# Patient Record
Sex: Male | Born: 1955
Health system: Southern US, Community
[De-identification: ages and names within clinical notes are randomized; demographics above are authoritative.]

## PROBLEM LIST (undated history)

## (undated) DIAGNOSIS — R112 Nausea with vomiting, unspecified: Secondary | ICD-10-CM

## (undated) DIAGNOSIS — F419 Anxiety disorder, unspecified: Secondary | ICD-10-CM

## (undated) DIAGNOSIS — R42 Dizziness and giddiness: Secondary | ICD-10-CM

## (undated) DIAGNOSIS — I10 Essential (primary) hypertension: Secondary | ICD-10-CM

## (undated) DIAGNOSIS — K219 Gastro-esophageal reflux disease without esophagitis: Secondary | ICD-10-CM

## (undated) DIAGNOSIS — G5601 Carpal tunnel syndrome, right upper limb: Secondary | ICD-10-CM

## (undated) DIAGNOSIS — M1990 Unspecified osteoarthritis, unspecified site: Secondary | ICD-10-CM

## (undated) DIAGNOSIS — E785 Hyperlipidemia, unspecified: Secondary | ICD-10-CM

## (undated) DIAGNOSIS — J189 Pneumonia, unspecified organism: Secondary | ICD-10-CM

## (undated) DIAGNOSIS — T8859XA Other complications of anesthesia, initial encounter: Secondary | ICD-10-CM

## (undated) DIAGNOSIS — D649 Anemia, unspecified: Secondary | ICD-10-CM

## (undated) DIAGNOSIS — R918 Other nonspecific abnormal finding of lung field: Secondary | ICD-10-CM

## (undated) DIAGNOSIS — M545 Low back pain, unspecified: Secondary | ICD-10-CM

## (undated) DIAGNOSIS — Z9889 Other specified postprocedural states: Secondary | ICD-10-CM

## (undated) DIAGNOSIS — T884XXA Failed or difficult intubation, initial encounter: Secondary | ICD-10-CM

## (undated) HISTORY — DX: Pneumonia, unspecified organism: J18.9

## (undated) HISTORY — DX: Gastro-esophageal reflux disease without esophagitis: K21.9

## (undated) HISTORY — PX: APPENDECTOMY: SHX54

## (undated) HISTORY — DX: Low back pain, unspecified: M54.50

## (undated) HISTORY — DX: Anxiety disorder, unspecified: F41.9

## (undated) HISTORY — PX: MANDIBLE FRACTURE SURGERY: SHX706

## (undated) HISTORY — DX: Hyperlipidemia, unspecified: E78.5

## (undated) HISTORY — DX: Essential (primary) hypertension: I10

## (undated) HISTORY — PX: CHEST TUBE INSERTION: SHX231

## (undated) HISTORY — DX: Failed or difficult intubation, initial encounter: T88.4XXA

## (undated) HISTORY — DX: Low back pain: M54.5

## (undated) HISTORY — PX: CERVICAL SPINE SURGERY: SHX589

---

## 1980-05-05 DIAGNOSIS — T884XXA Failed or difficult intubation, initial encounter: Secondary | ICD-10-CM

## 1980-05-05 HISTORY — DX: Failed or difficult intubation, initial encounter: T88.4XXA

## 2001-05-14 ENCOUNTER — Emergency Department (HOSPITAL_COMMUNITY): Admission: EM | Admit: 2001-05-14 | Discharge: 2001-05-15 | Payer: Self-pay | Admitting: Emergency Medicine

## 2003-02-07 ENCOUNTER — Ambulatory Visit (HOSPITAL_COMMUNITY): Admission: RE | Admit: 2003-02-07 | Discharge: 2003-02-07 | Payer: Self-pay | Admitting: Family Medicine

## 2003-03-23 ENCOUNTER — Encounter (HOSPITAL_COMMUNITY): Admission: RE | Admit: 2003-03-23 | Discharge: 2003-04-22 | Payer: Self-pay | Admitting: Neurosurgery

## 2003-04-24 ENCOUNTER — Encounter (HOSPITAL_COMMUNITY): Admission: RE | Admit: 2003-04-24 | Discharge: 2003-05-24 | Payer: Self-pay | Admitting: Neurosurgery

## 2003-05-08 ENCOUNTER — Encounter (HOSPITAL_COMMUNITY): Admission: RE | Admit: 2003-05-08 | Discharge: 2003-06-07 | Payer: Self-pay | Admitting: Neurosurgery

## 2004-01-02 ENCOUNTER — Ambulatory Visit (HOSPITAL_COMMUNITY): Admission: RE | Admit: 2004-01-02 | Discharge: 2004-01-03 | Payer: Self-pay | Admitting: Neurosurgery

## 2004-04-01 ENCOUNTER — Emergency Department (HOSPITAL_COMMUNITY): Admission: EM | Admit: 2004-04-01 | Discharge: 2004-04-01 | Payer: Self-pay | Admitting: Emergency Medicine

## 2004-06-25 ENCOUNTER — Emergency Department (HOSPITAL_COMMUNITY): Admission: EM | Admit: 2004-06-25 | Discharge: 2004-06-25 | Payer: Self-pay | Admitting: *Deleted

## 2004-07-22 ENCOUNTER — Encounter (HOSPITAL_COMMUNITY): Admission: RE | Admit: 2004-07-22 | Discharge: 2004-08-21 | Payer: Self-pay | Admitting: Neurosurgery

## 2004-10-07 ENCOUNTER — Emergency Department (HOSPITAL_COMMUNITY): Admission: EM | Admit: 2004-10-07 | Discharge: 2004-10-07 | Payer: Self-pay | Admitting: Emergency Medicine

## 2004-12-06 ENCOUNTER — Emergency Department (HOSPITAL_COMMUNITY): Admission: EM | Admit: 2004-12-06 | Discharge: 2004-12-06 | Payer: Self-pay | Admitting: Emergency Medicine

## 2005-04-21 ENCOUNTER — Emergency Department (HOSPITAL_COMMUNITY): Admission: EM | Admit: 2005-04-21 | Discharge: 2005-04-21 | Payer: Self-pay | Admitting: Emergency Medicine

## 2005-05-27 ENCOUNTER — Inpatient Hospital Stay (HOSPITAL_COMMUNITY): Admission: EM | Admit: 2005-05-27 | Discharge: 2005-05-31 | Payer: Self-pay | Admitting: *Deleted

## 2005-05-27 ENCOUNTER — Encounter (INDEPENDENT_AMBULATORY_CARE_PROVIDER_SITE_OTHER): Payer: Self-pay | Admitting: Specialist

## 2005-09-30 ENCOUNTER — Emergency Department (HOSPITAL_COMMUNITY): Admission: EM | Admit: 2005-09-30 | Discharge: 2005-09-30 | Payer: Self-pay | Admitting: Emergency Medicine

## 2005-10-02 ENCOUNTER — Emergency Department (HOSPITAL_COMMUNITY): Admission: EM | Admit: 2005-10-02 | Discharge: 2005-10-02 | Payer: Self-pay | Admitting: Emergency Medicine

## 2006-03-30 ENCOUNTER — Emergency Department (HOSPITAL_COMMUNITY): Admission: EM | Admit: 2006-03-30 | Discharge: 2006-03-30 | Payer: Self-pay | Admitting: Emergency Medicine

## 2007-01-02 ENCOUNTER — Emergency Department (HOSPITAL_COMMUNITY): Admission: EM | Admit: 2007-01-02 | Discharge: 2007-01-02 | Payer: Self-pay | Admitting: Emergency Medicine

## 2007-01-03 ENCOUNTER — Emergency Department (HOSPITAL_COMMUNITY): Admission: EM | Admit: 2007-01-03 | Discharge: 2007-01-03 | Payer: Self-pay | Admitting: Emergency Medicine

## 2007-01-07 ENCOUNTER — Ambulatory Visit (HOSPITAL_COMMUNITY): Admission: RE | Admit: 2007-01-07 | Discharge: 2007-01-07 | Payer: Self-pay | Admitting: Neurosurgery

## 2007-02-10 ENCOUNTER — Emergency Department (HOSPITAL_COMMUNITY): Admission: EM | Admit: 2007-02-10 | Discharge: 2007-02-10 | Payer: Self-pay | Admitting: Emergency Medicine

## 2007-02-25 ENCOUNTER — Emergency Department (HOSPITAL_COMMUNITY): Admission: EM | Admit: 2007-02-25 | Discharge: 2007-02-25 | Payer: Self-pay | Admitting: Emergency Medicine

## 2008-01-21 ENCOUNTER — Emergency Department (HOSPITAL_COMMUNITY): Admission: EM | Admit: 2008-01-21 | Discharge: 2008-01-21 | Payer: Self-pay | Admitting: Emergency Medicine

## 2008-01-24 ENCOUNTER — Emergency Department (HOSPITAL_COMMUNITY): Admission: EM | Admit: 2008-01-24 | Discharge: 2008-01-24 | Payer: Self-pay | Admitting: Emergency Medicine

## 2008-01-28 ENCOUNTER — Ambulatory Visit: Payer: Self-pay | Admitting: Pulmonary Disease

## 2008-01-28 ENCOUNTER — Ambulatory Visit: Payer: Self-pay | Admitting: Thoracic Surgery (Cardiothoracic Vascular Surgery)

## 2008-01-28 ENCOUNTER — Inpatient Hospital Stay (HOSPITAL_COMMUNITY): Admission: EM | Admit: 2008-01-28 | Discharge: 2008-02-10 | Payer: Self-pay | Admitting: Family Medicine

## 2008-01-31 ENCOUNTER — Encounter: Payer: Self-pay | Admitting: Cardiothoracic Surgery

## 2008-02-18 ENCOUNTER — Encounter: Admission: RE | Admit: 2008-02-18 | Discharge: 2008-02-18 | Payer: Self-pay | Admitting: Cardiothoracic Surgery

## 2008-02-18 ENCOUNTER — Ambulatory Visit: Payer: Self-pay | Admitting: Cardiothoracic Surgery

## 2008-03-03 ENCOUNTER — Encounter: Admission: RE | Admit: 2008-03-03 | Discharge: 2008-03-03 | Payer: Self-pay | Admitting: Cardiothoracic Surgery

## 2008-03-03 ENCOUNTER — Ambulatory Visit: Payer: Self-pay | Admitting: Cardiothoracic Surgery

## 2008-04-21 ENCOUNTER — Ambulatory Visit: Payer: Self-pay | Admitting: Cardiothoracic Surgery

## 2008-04-21 ENCOUNTER — Encounter: Admission: RE | Admit: 2008-04-21 | Discharge: 2008-04-21 | Payer: Self-pay | Admitting: Cardiothoracic Surgery

## 2009-04-11 ENCOUNTER — Ambulatory Visit: Payer: Self-pay | Admitting: Cardiothoracic Surgery

## 2009-10-05 ENCOUNTER — Emergency Department (HOSPITAL_COMMUNITY): Admission: EM | Admit: 2009-10-05 | Discharge: 2009-10-05 | Payer: Self-pay | Admitting: Emergency Medicine

## 2009-10-12 IMAGING — CR DG CHEST 2V
2 series · 2 of 2 positions shown · non-contrast
Comparison: None

CLINICAL DATA: .  Cough and fever.

CHEST - 1 VIEW

[w chest pa]
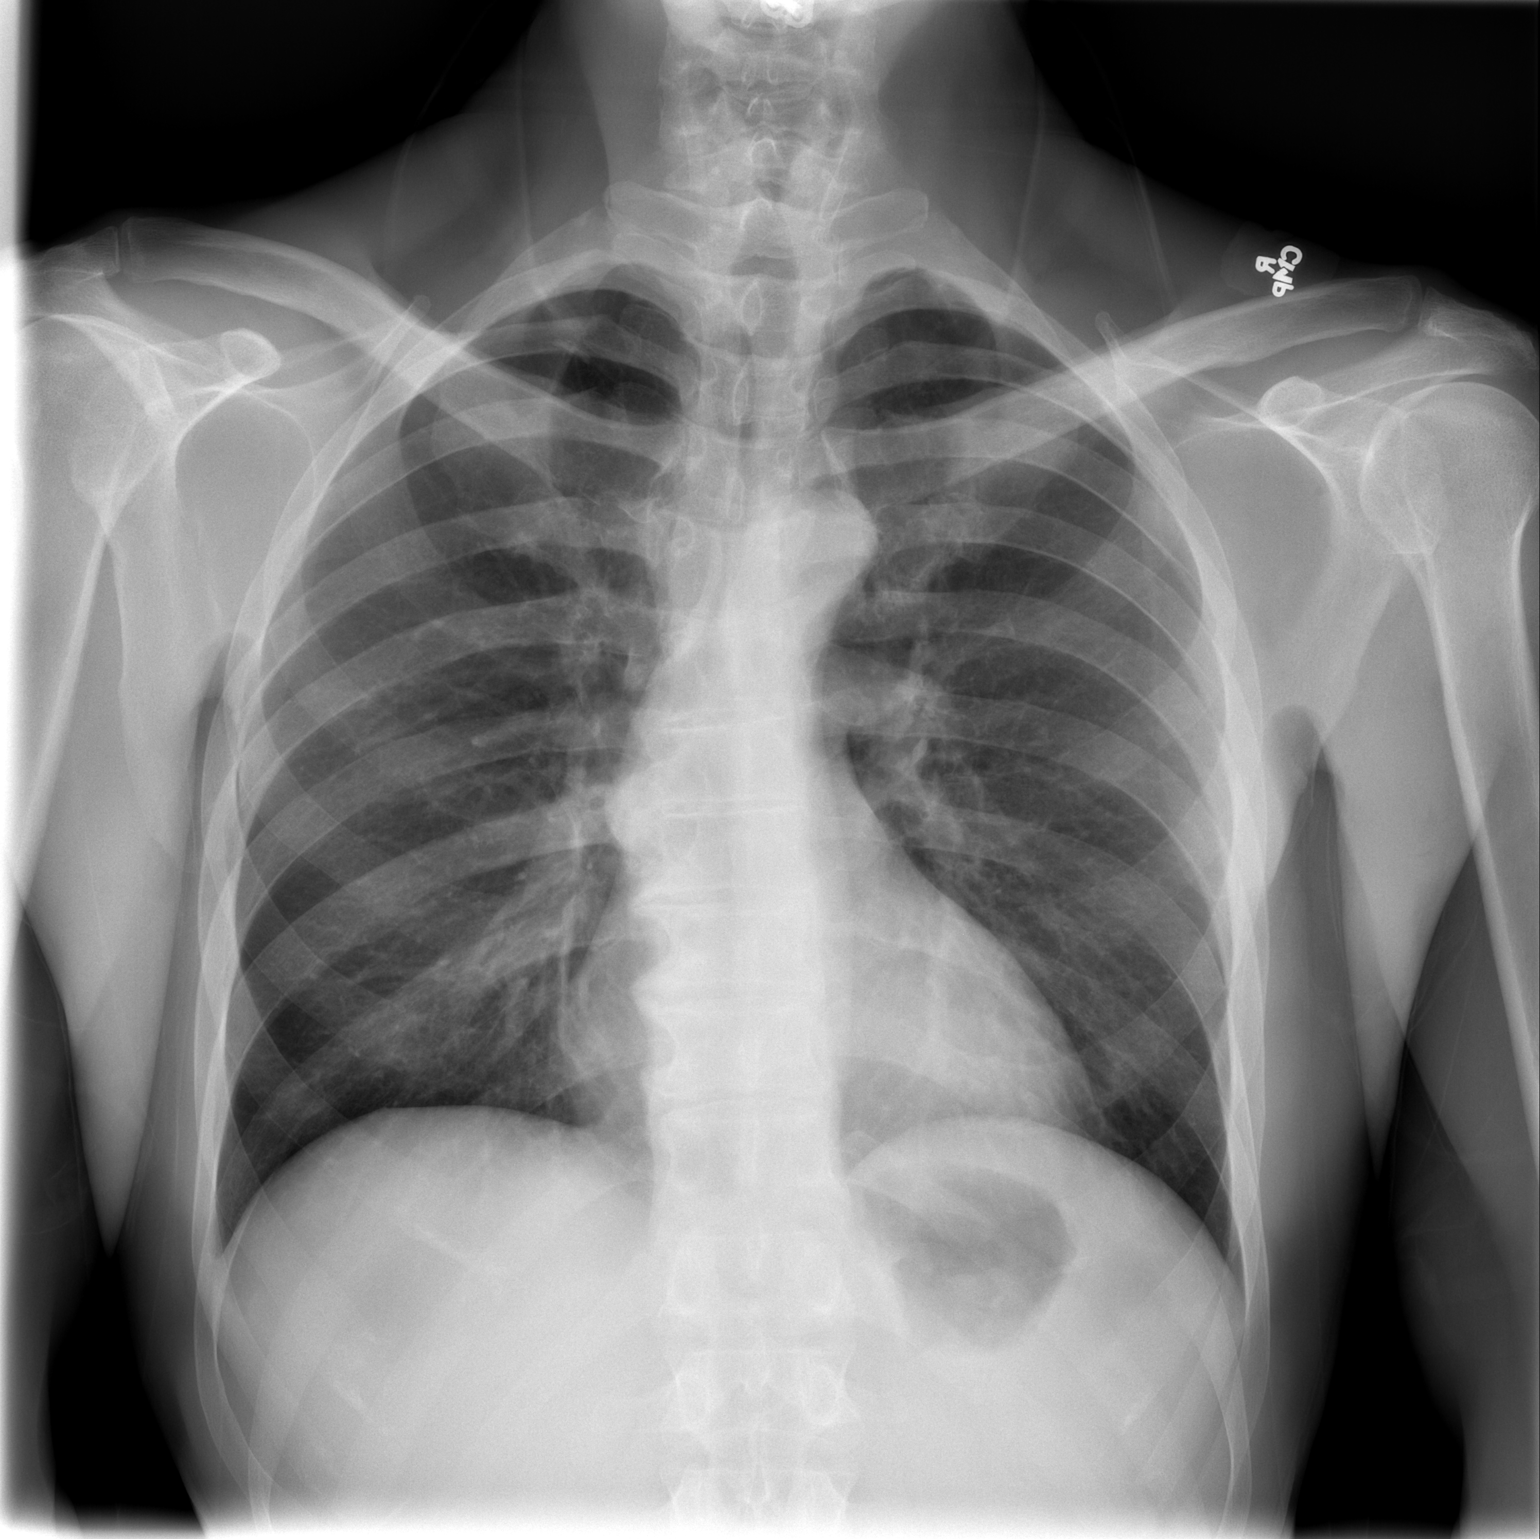

[w chest lat]
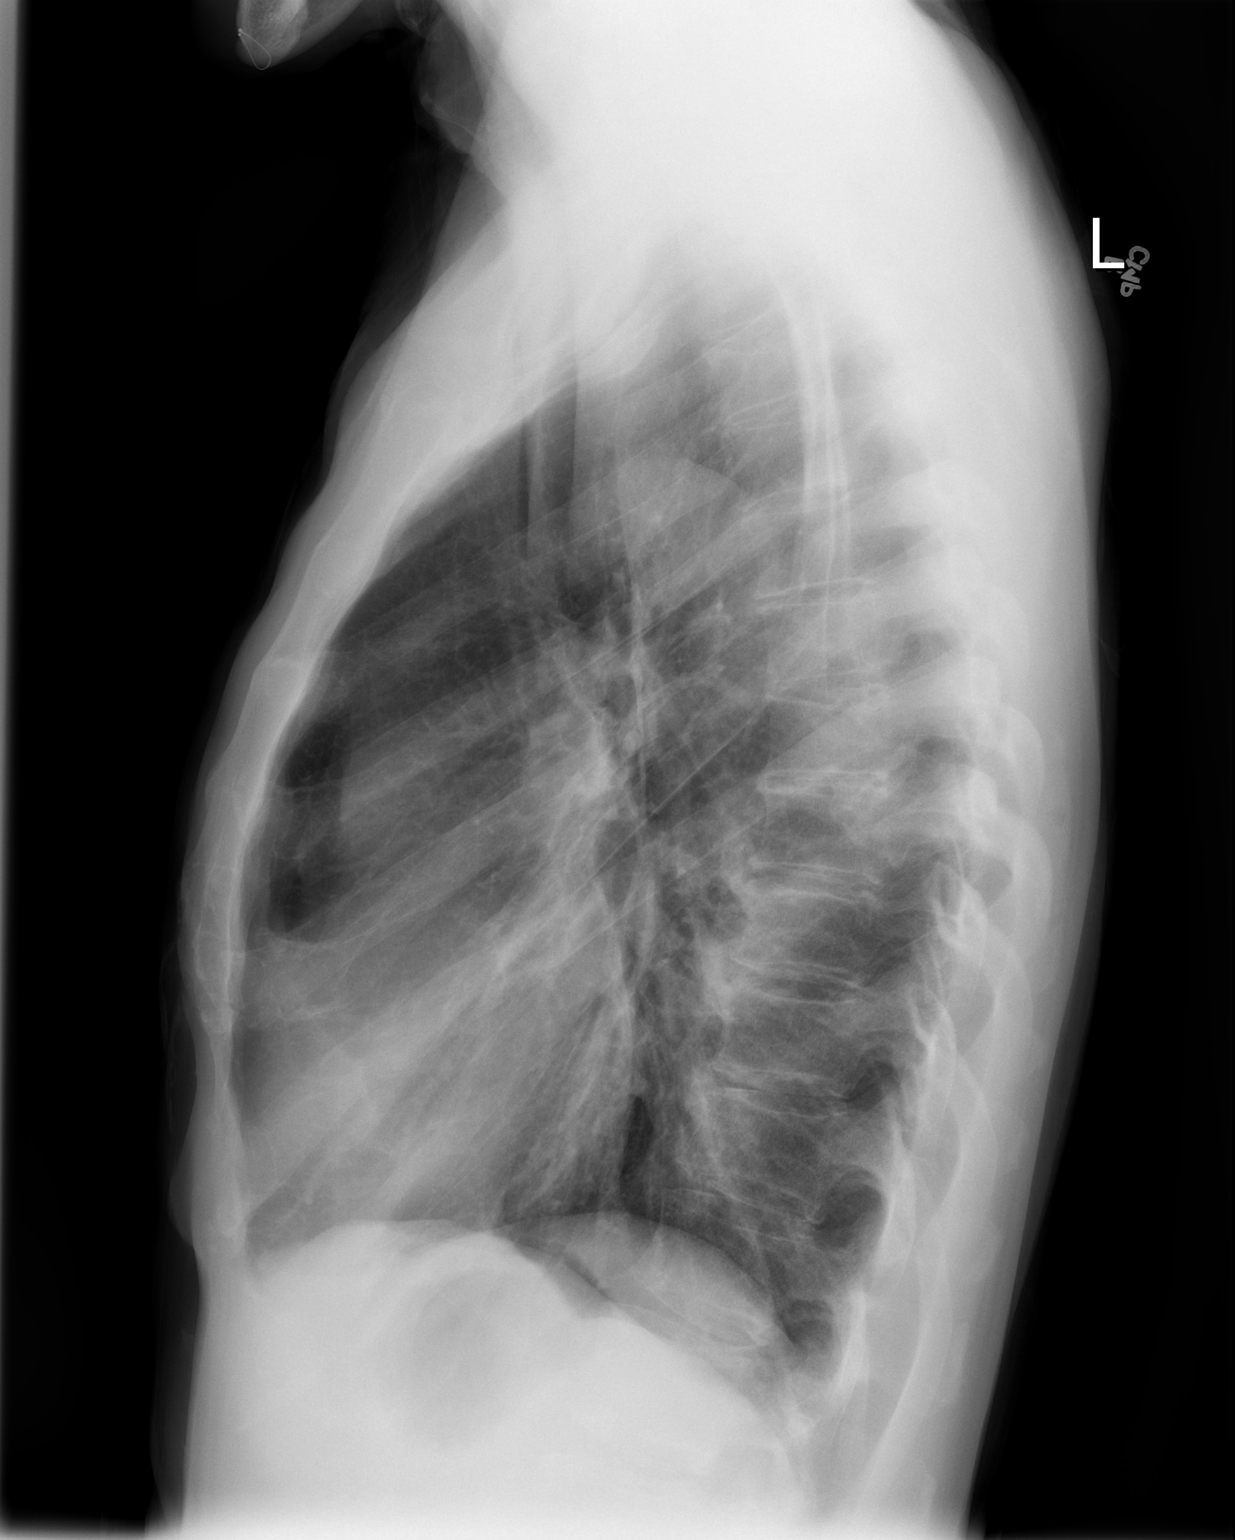

[2 of 2 positions shown; findings below may reference images not displayed]

FINDINGS: The heart size and mediastinal contours are within normal
limits.  Both lungs are clear.
IMPRESSION: No active disease.

## 2009-10-15 IMAGING — CR DG CHEST 2V
2 series · 2 of 2 positions shown · non-contrast
Comparison: 01/21/2008

CLINICAL DATA: Chest pain and cough.  Shortness of breath and chest
congestion.

CHEST - 2 VIEW

[w chest pa]
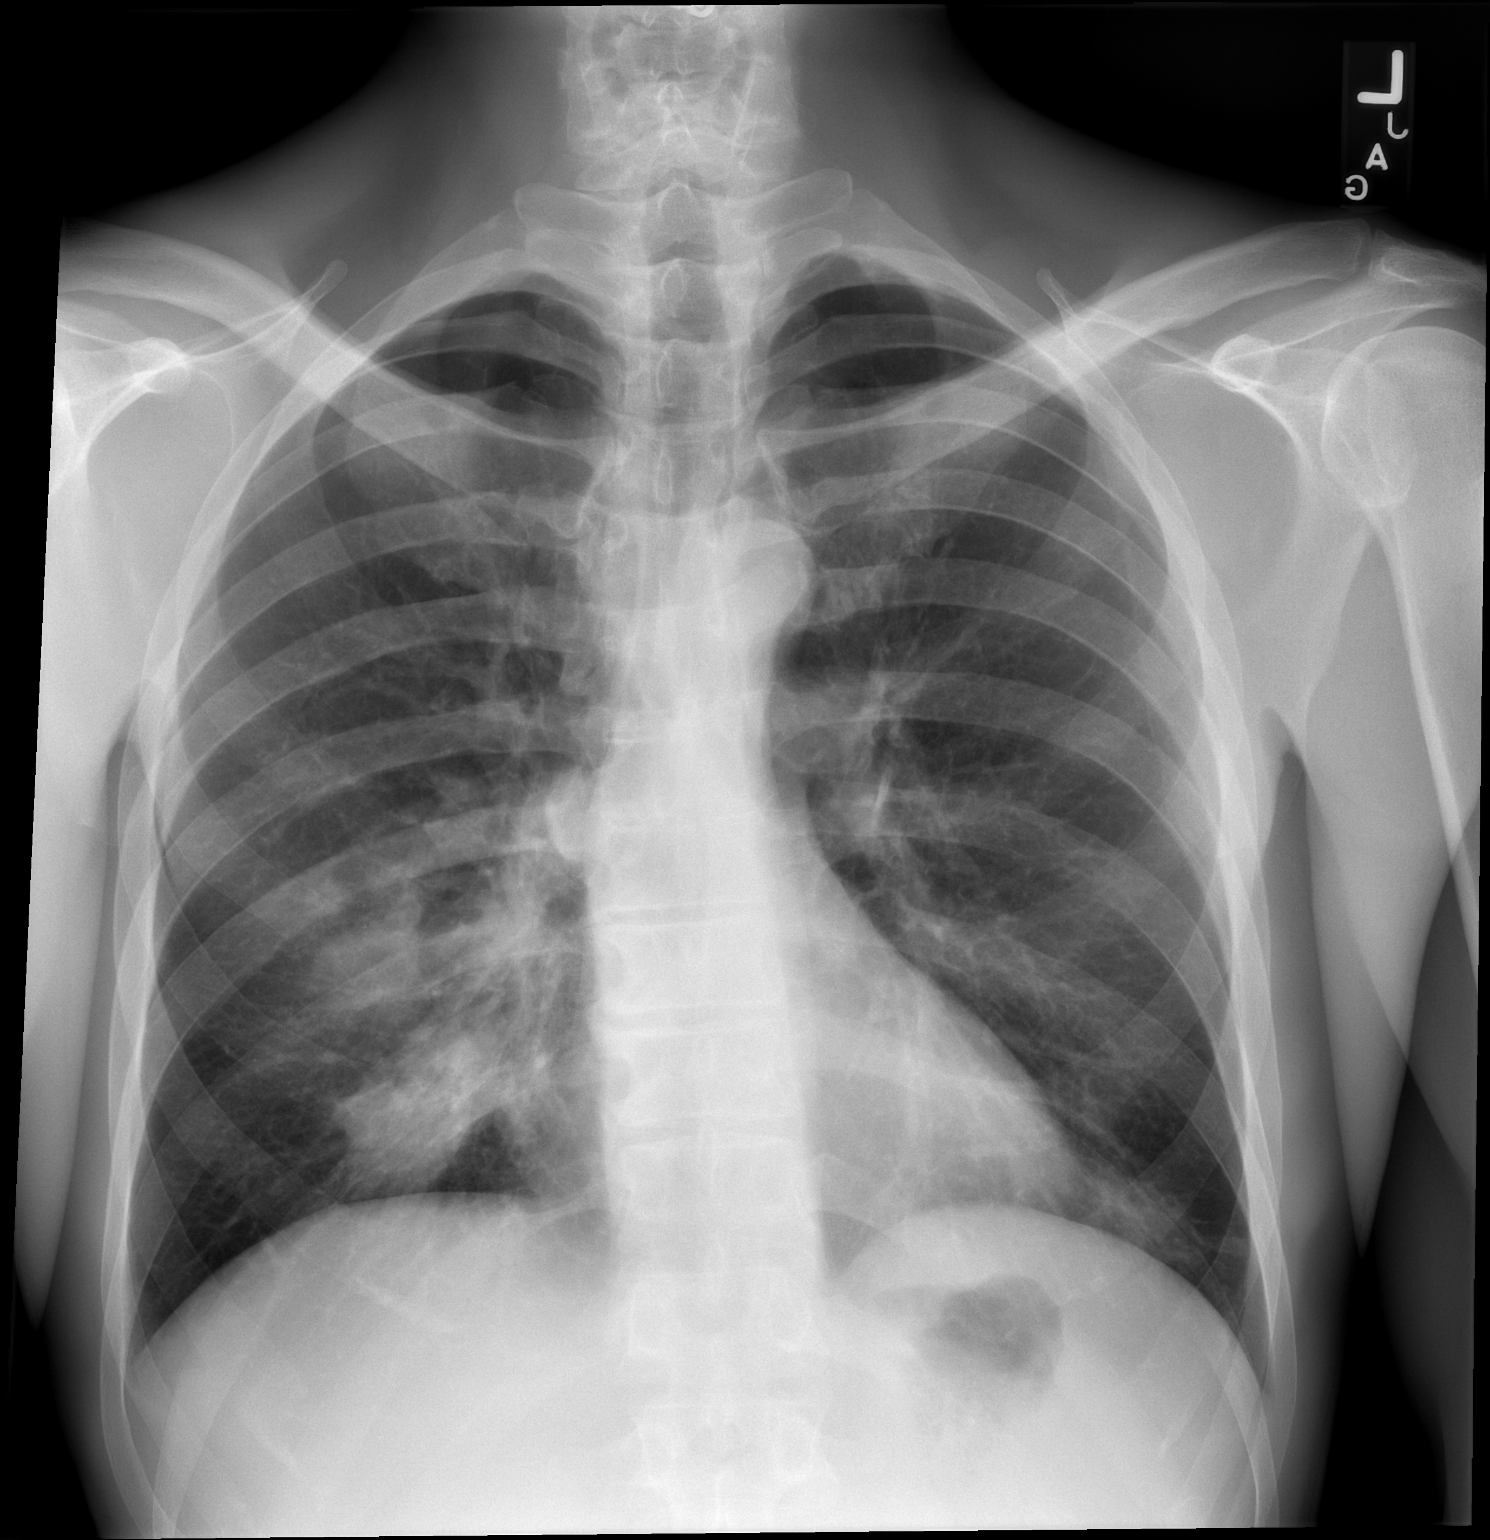

[w chest lat]
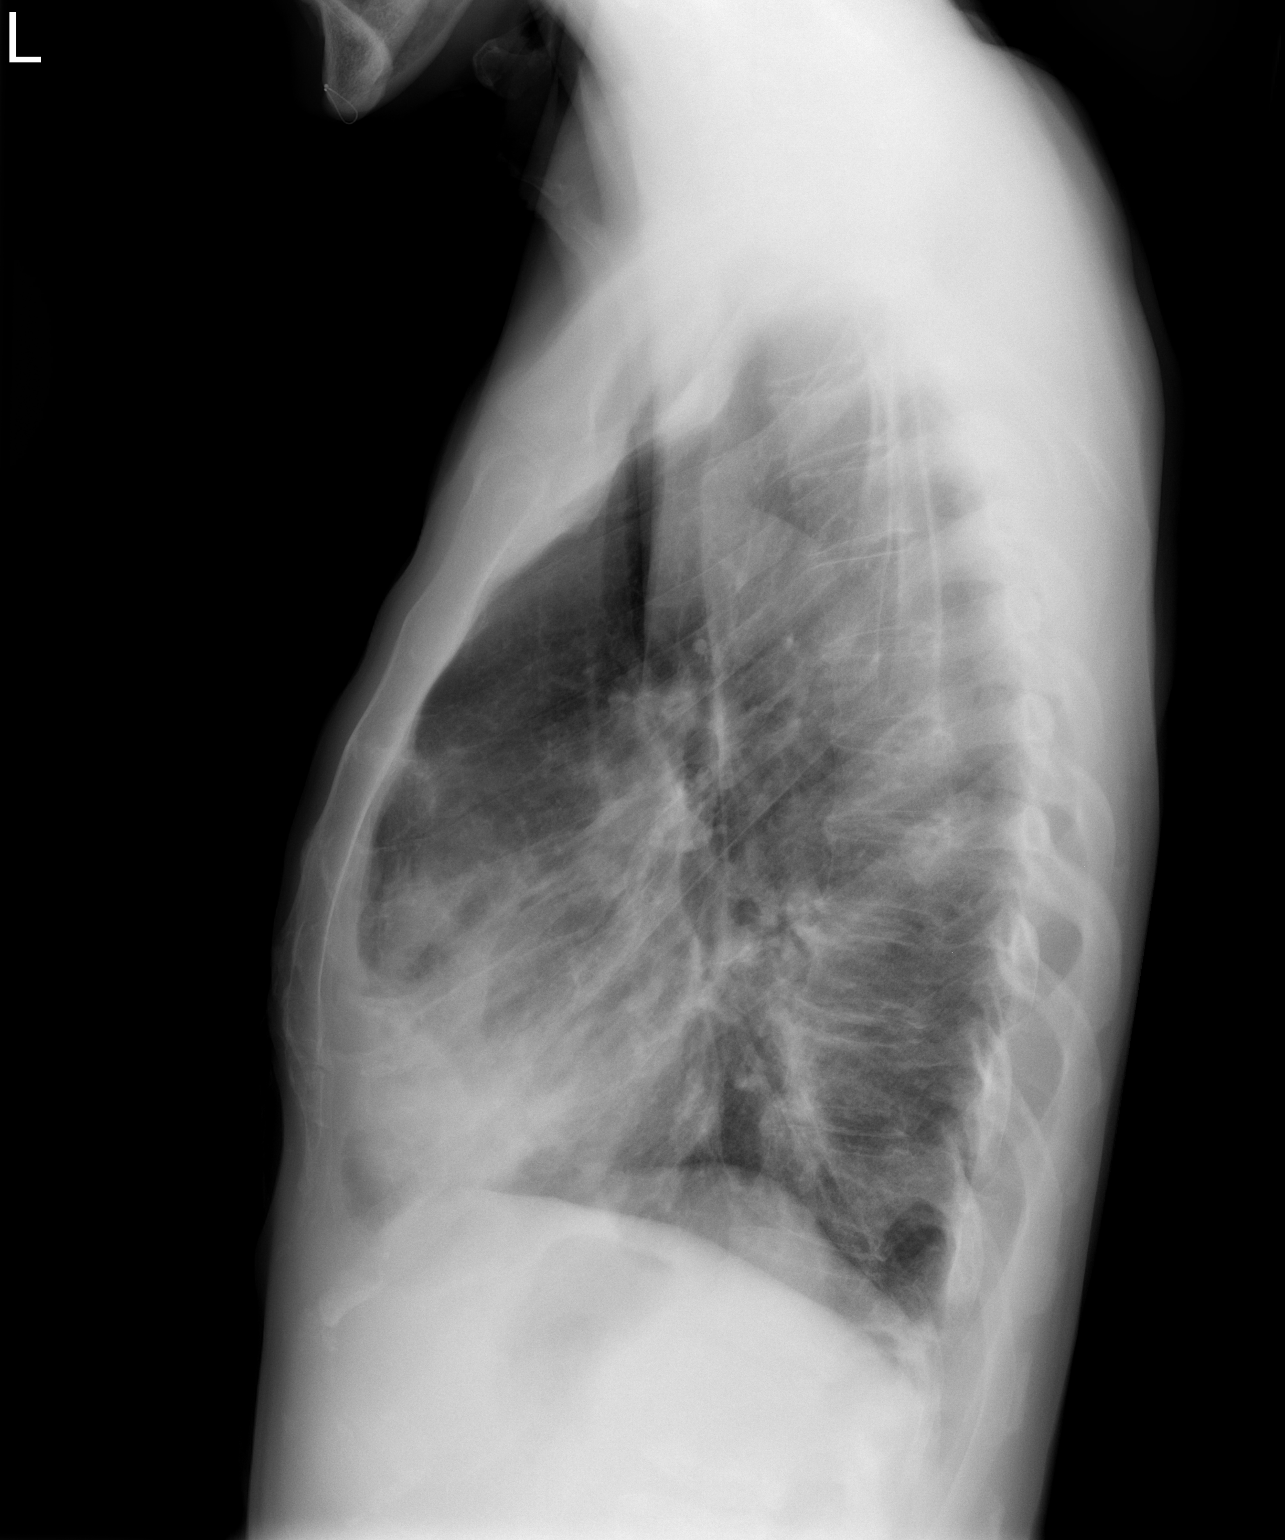

[2 of 2 positions shown; findings below may reference images not displayed]

FINDINGS: New nodular airspace opacities are present in the right
middle lobe, with associated consolidation.  Given the patient's
history, the appearance is most compatible with pneumonia.  Follow-
up to clearance is recommended.

Cardiac and mediastinal contours appear unremarkable.  There is
subsegmental atelectasis in the left lower lobe.
IMPRESSION: 1.  Consolidation in the right middle lobe compatible with
pneumonia.  Recommend follow-up to clearance.

## 2009-10-24 IMAGING — CR DG CHEST 1V PORT
1 series · 1 of 1 positions shown · non-contrast
Comparison: 02/01/2008

CLINICAL DATA: Pneumonia

PORTABLE CHEST - 1 VIEW

[view not recorded]
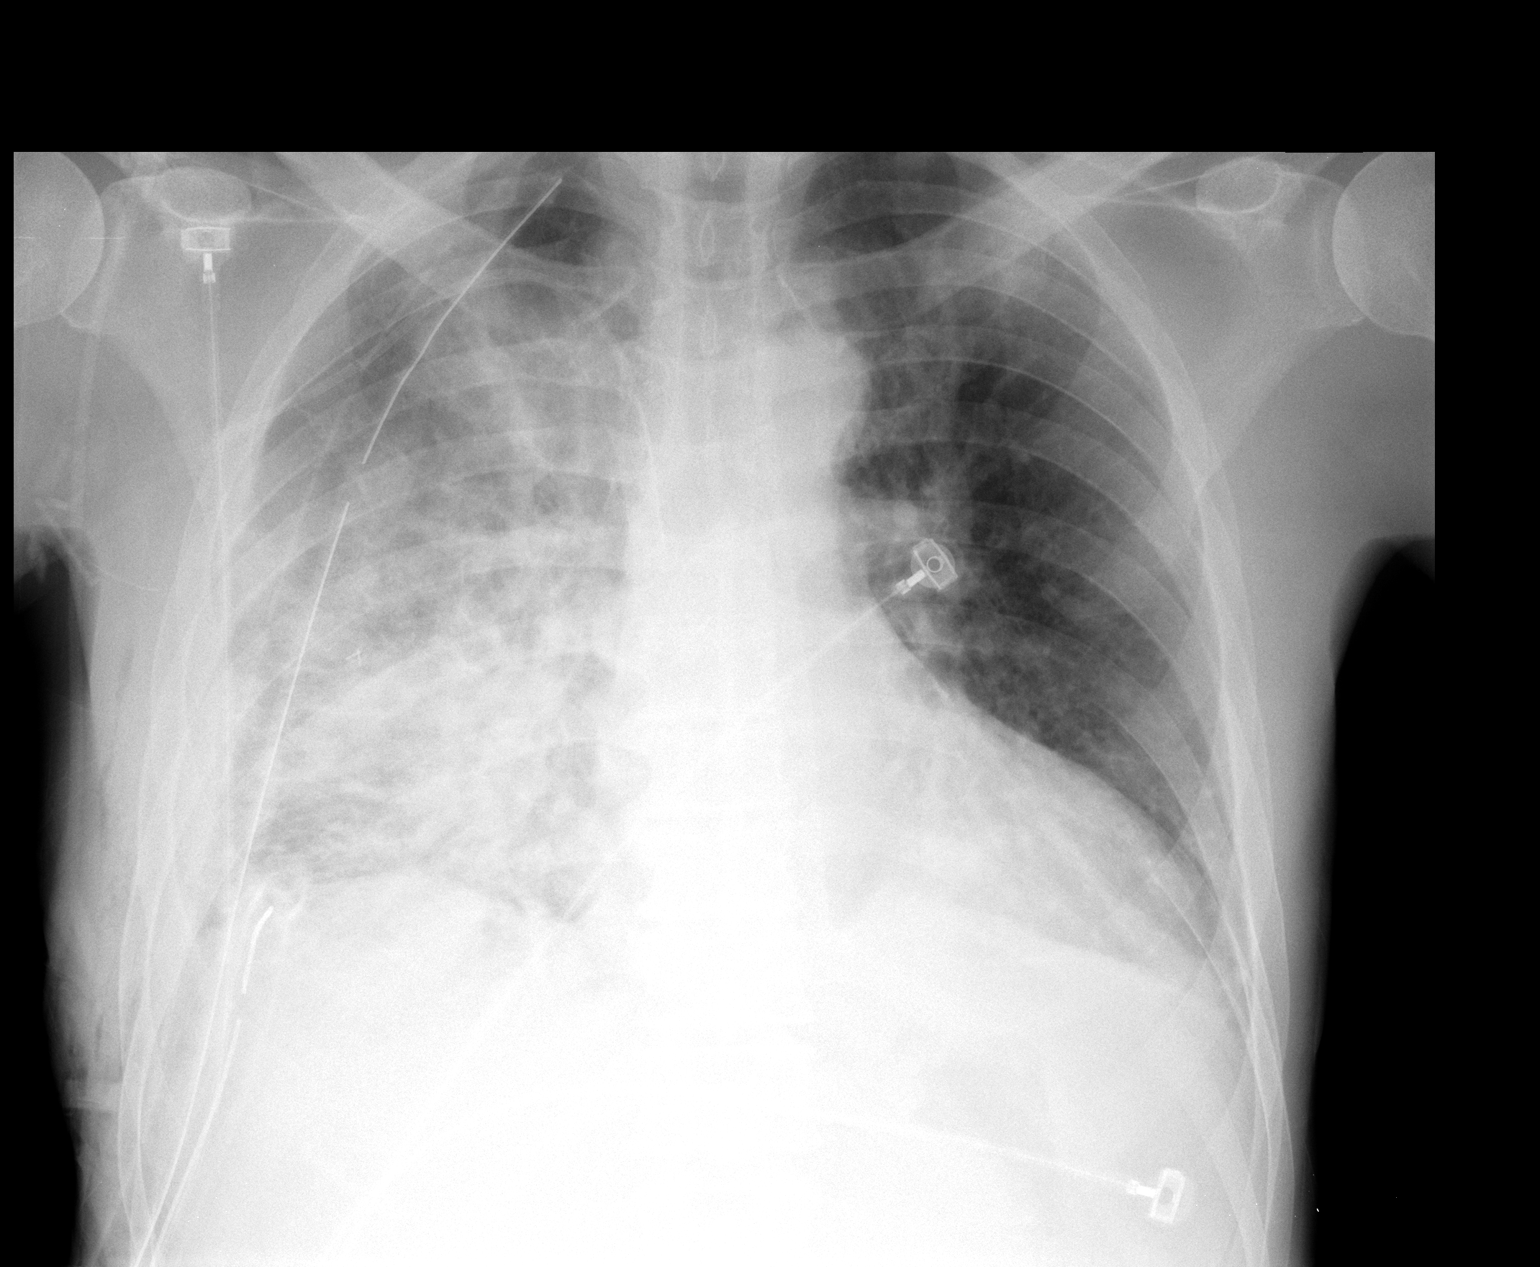

[1 of 1 positions shown; findings below may reference images not displayed]

FINDINGS: Right subclavian center venous catheter and chest tubes
are stable.  No pneumothorax.  Extensive airspace disease
throughout the right lung is stable.
IMPRESSION: No change.  Right airspace disease.

## 2009-11-09 ENCOUNTER — Emergency Department (HOSPITAL_COMMUNITY): Admission: EM | Admit: 2009-11-09 | Discharge: 2009-11-09 | Payer: Self-pay | Admitting: Family Medicine

## 2009-11-12 ENCOUNTER — Emergency Department (HOSPITAL_COMMUNITY): Admission: EM | Admit: 2009-11-12 | Discharge: 2009-11-12 | Payer: Self-pay | Admitting: Family Medicine
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
## 2010-09-17 NOTE — Assessment & Plan Note (Signed)
OFFICE VISIT   SYD, NEWSOME  DOB:  May 30, 1955                                        March 03, 2008  CHART #:  [id number]   The patient is status post right VATS with drainage of right empyema and  decortication on January 23, 2008, by Dr. Donata Clay.  He returns today  for a followup visit.  He has finished up his course of IV antibiotics.  His PICC line was removed at his last office visit.  The patient has  returned back to work.  He is progressing slowly.  He still complains of  some incisional pain and requesting more Vicodin.  He is ambulating  without difficulty.  Denies any shortness of breath.  Denies any fevers,  opening or drainage from any of his incision sites.   PHYSICAL EXAMINATION:  VITAL SIGNS:  Blood pressure of 143/87, pulse of  89, respirations 18, and O2 sats 100% on room air.  RESPIRATORY:  Clear to auscultation bilaterally.  CARDIAC:  Regular rate and rhythm.  INCISIONS:  All incisions are clean, dry, and intact and healing well.   STUDIES:  The patient had PA and lateral chest x-ray done today, which  is stable, with no significant effusion noted.   IMPRESSION AND PLAN:  The patient continues to progress well.  He was  told to continue ambulating 3-4 times per day.  He was given a  prescription for Vicodin 5/500, total #40, to take 1 tablet q.12 h.  The  patient was seen and evaluated by Dr. Donata Clay.  Dr. Donata Clay  evaluated the patient's x-ray.  We will plan to follow up with the  patient in about 4 weeks when Dr. Donata Clay returns from [facility] with a repeat chest x-ray.  The patient  was told if he has any surgical issues to contact us.   Kerin Perna, M.D.  Electronically Signed   KMD/MEDQ  D:  03/03/2008  T:  03/04/2008  Job:  [id number]   cc:   Kerin Perna, M.D.

## 2010-09-17 NOTE — Assessment & Plan Note (Signed)
OFFICE VISIT   [name], [name]  DOB:  1956/03/21                                        February 18, 2008  CHART #:  [id number]   CURRENT PROBLEMS:  1. Status post right VATS, decortication drainage of empyema      01/31/2008.  2. MRSA pneumonia and empyema, right chest.   HISTORY OF PRESENT ILLNESS:  The patient returns for his first postop  visit after undergoing right VATS decortication for a large complex  empyema, which brought MRSA.  He has been treated with a 3 course of IV  vancomycin, which he completed with a PICC line at home.  He denies  fever and he states he has noticed significant improvement in his  breathing.  The incisions are healing well.   PHYSICAL EXAMINATION:  Blood pressure 120/70, pulse 80, respirations 18,  and saturation 98%.  He is alert and pleasant.  Breath sounds are equal  and clear bilaterally.  The thoracotomy incision is well-healed.  The  chest tube sites are healing and the sutures are all removed.   PA and lateral chest x-ray shows good aeration of the right lung without  recurrent effusion or infiltrate.  He has some pleural thickening at the  right costophrenic angle.  Otherwise, the x-ray looks almost normal.   The right arm PICC line was removed in the office today and a  compression dressing applied.   PLAN:  The patient will stay out of work for another week and then  return to work on February 27, 2008.  I told him he could resume driving,  lifting, and light activities.  He was given more pain medication  (Vicodin) for incisional pain.  He is to return in 2 weeks with a chest  x-ray.   Kerin Perna, M.D.  Electronically Signed   PV/MEDQ  D:  02/18/2008  T:  02/19/2008  Job:  [id number]

## 2010-09-17 NOTE — Op Note (Signed)
NAMECONNELL, BOGNAR NO.:  [id number]   MEDICAL RECORD NO.:  [id number]          PATIENT TYPE:  INP   LOCATION:  3308                         FACILITY:  [facility]   PHYSICIAN:  Kerin Perna, M.D.  DATE OF BIRTH:  Sep 21, 1955   DATE OF PROCEDURE:  01/31/2008  DATE OF DISCHARGE:                               OPERATIVE REPORT   OPERATION:  1. Right video-assisted thoracoscopic surgery, drainage of right      empyema and decortication.  2. Placement of wound On-Q analgesia catheter system.   SURGEON:  Kerin Perna, MD   ANESTHESIA:  General.   PREOPERATIVE DIAGNOSIS:  Right empyema.   POSTOPERATIVE DIAGNOSIS:  Right empyema.   INDICATIONS:  The patient is a 55 year old gentleman who presented to  the hospital with a recent history of cough, myalgia, fever, and right-  sided chest pain.  A chest x-ray and a followup CT scan showed evidence  of right middle lobe pneumonia with a large loculated effusion -  empyema.  It was felt that thoracic surgical intervention with  decortication was indicated.  The patient was examined by Dr. Tressie Stalker as an urgent consult and felt that the patient was a candidate for  right VATS with decortication empyema.  The patient was scheduled for  myself to perform the surgery.  I examined the patient in the preop area  prior to surgery and discussed the procedure and answered questions.   PROCEDURE:  The patient was brought to the operating room and placed  supine on the operating room table where general anesthesia was induced.  A double-lumen endotracheal tube was positioned by the anesthesiologist.  The patient was turned to expose the right side which was prepped and  draped as a sterile field.  A time-out was performed for proper patient  and proper site.  A small incision was made in the fifth interspace and  the VATS camera was inserted.  The hemithorax was obliterated with  adhesions and loculated fluid.  Cultures  were taken of this fluid.  The  incision was then extended and the ribs spread with a mini retractor.  The rib was not divided.  Multiple areas of loculated effusion with  adhesions and a fibrinous exudate peel over the lung were then gently  dissected and removed.  The lung was completely freed and all spaces  above the diaphragm, against the mediastinum, and along the paraspinal  gutter were all opened and cleared out and drained.  After adequate  drainage and debridement of the fibrinous peel over the visceral pleura  had been accomplished, the hemithorax was irrigated with antibiotic  irrigation.  Some areas of the lung were visualized at the apex and  these were covered with some CoSeal lung sealant.  Two 36-French chest  tubes were placed for drainage of the right hemithorax and brought out  through separate incisions and secured to the skin.  Next, under direct  vision, the right lung was reinflated by the anesthesiologist and the  chest tubes were in good position and the lung re-expanded well.   The incision  was then closed with interrupted #2 Vicryl pericostals.  The left isthmus muscle was closed with interrupted #1 Vicryl.  The  serratus muscle had been spared.  The subcutaneous was closed with  running 2-0 Vicryl and the skin was closed with a subcuticular.   The On-Q catheter was placed beneath the main incision but above the  chest tube sites.  The initial catheter that was placed was defective  and the plastic crumbled and fragmented in several pieces.  An incision  was made superficially through the skin and into the superficial soft  tissue to remove this catheter.  These incisions were then closed with a  running 0 Vicryl.  A second On-Q catheter had been checked and found to  be functional and was placed in the proximal at the same area and  irrigated with 0.5% Marcaine and secured to the skin.  This was  connected to the reservoir of Marcaine.  The patient was then  rolled  supine, extubated, and returned to recovery room in stable condition.      Kerin Perna, M.D.  Electronically Signed     PV/MEDQ  D:  01/31/2008  T:  02/01/2008  Job:  [id number]

## 2010-09-17 NOTE — H&P (Signed)
[name], [name]              ACCOUNT NO.:  [id number]   MEDICAL RECORD NO.:  [id number]          PATIENT TYPE:  INP   LOCATION:  3708                         FACILITY:  [facility]   PHYSICIAN:  Herbie Saxon, MDDATE OF BIRTH:  [birth year]   DATE OF ADMISSION:  01/28/2008  DATE OF DISCHARGE:  01/24/2008                              HISTORY & PHYSICAL   PRIMARY CARE PHYSICIAN:  Unassigned.   He has no health care power of attorney.  He is a full code.   PRESENTING COMPLAINTS:  Cough; bloody greenish sputum; body aches; and  fever, 1 week duration.   ADMITTING DIAGNOSES:  1. Acute respiratory failure.  2. Acute respiratory alkalosis.   HISTORY OF PRESENTING COMPLAINT:  This is a 55 year old Caucasian male  who has presented to the emergency room twice this week.  He noticed  increasing cough with shortness of breath and production of greenish  blood-streaked sputum.  He was seen and evaluated in the emergency room,  placed on Zithromax and analgesics.  He represented to the emergency  room a day after he was reevaluated and discharged home on p.o.  antibiotic as previously given.  He is presenting today with increasing  night sweats, dyspnea on mild exertion, right-sided pleuritic chest  pain, easy fatigability, generalized body pain, fever, and chills.  He  has nausea and anorexia, has no vomiting, diarrhea, or constipation.  He  has right upper quadrant abdominal pain.  No abdominal distention.  There is no palpitation.  He denies syncopal episodes.   PAST MEDICAL HISTORY:  Lung collapse.   PAST SURGICAL HISTORY:  1. Appendectomy in 2007.  2. Herniated disk, status post surgery in 2006.   FAMILY HISTORY:  Mother had lymphoma.  Father, dementia and alcoholism.   SOCIAL HISTORY:  Single.  He has been in contact with people with flu-  like symptoms at his work and also with his girlfriend's children.  He  denies any recent travel to [place] or to other country.  He  works in [employer] in a closely clumped environment.  He denies any contact with  the patients with PPD symptoms or diagnosis, and does not recollect  having had PPD done for.  Also, he has quit drinking alcohol in the last  20 years.  He has not had any alcoholic drinks per him in the last 20  years.  No history of smoking cigarette or drug abuse.   REVIEW OF SYSTEMS:  A 12-point review of systems, pertinent positives as  in the history of presenting complaints.   ALLERGIES:  No known drug allergies.   MEDICATIONS:  1. Zithromax 250 mg daily.  2. Advil 1 tablet every 8 hours as needed.  3. Cough expectorant 10 mL as needed.  4. Ibuprofen 200 mg every 8 hours as needed.  5. Ventolin inhaler 2 puffs every 6 hours as needed.   The patient also gives unintentional weight loss in the last 3 months;  however, he does not have any new skin rash or lymph node swelling.   PHYSICAL EXAMINATION:  GENERAL:  On examination, he is  a middle-aged  man, acutely ill looking.  VITAL SIGNS:  Temperature is 100.6, pulse is 93, respiratory rate is 20,  and blood pressure 100/70.  HEENT:  Pupils are equal, reactive to light and accommodation, is  diaphoretic.  Oropharynx and nasopharynx are clear.  Mucous membranes  are moist.  NECK:  Supple with some submandibular adenopathy.  CHEST:  He has coarse rales and reduced breath sounds at the right base.  No rhonchi.  HEART:  S1 and S2.  Regular rate and rhythm.  No gallops, rubs, or  murmurs.  ABDOMEN:  He has right upper quadrant tenderness.  Liver is tipped and  tender.  No splenomegaly.  Bowel sounds normoactive.  Inguinal orifices  are patent.  NEUROLOGIC:  He is alert and oriented to time, place, and person.  Cranial nerves, motor and sensory system normal.  Babinski negative.  EXTREMITIES:  Peripheral pulses present.  No pedal edema.  No erythema.  No cyanosis or clubbing.  There is no calf tenderness.   LABORATORY DATA:  Available lab show  that the WBC 16,000, hematocrit 40,  and platelet count is 380.  ABG, pH is 7.51, pCO2 of 39, bicarb 32, and  oxygen is 64.  Chemistry; sodium is 138, potassium 3.4, chloride 106,  bicarbonate 102, creatinine 1.1, and BUN is 18.  Chest x-ray shows  increasing right medial lobe and increasing right lower infiltrates plus  loculated right pleural effusion.   ASSESSMENT:  1. Right-sided pneumonia and pleural effusion.  2. Query pulmonary tuberculosis, rule out lung abscess.  3. Hypokalemia.  4. Leukocytosis.   The patient will be admitted to the telemetry bed.  He is to be on  respiratory isolation and contact precautions.  We sent his H1N1 nasal  swab.  We started him on Tamiflu 75 mg b.i.d.  We will also start him on  IV vancomycin and Zosyn after sending blood cultures, sputum cultures,  sputum AFB, urine Legionella antigen, and mycoplasma serology.  We will  also send an ESR and HIV testing, thyroid function test, coagulation  parameter, calcium, magnesium, and phosphate levels will be tested.  He  is to be on continuous oxygen 2-5 L via nasal canula.  We will get  Interventional Radiology evaluation for possible thoracocentesis of his  occlusive pleural effusion.  Also, pulmonary and ID inputs if his  respiratory cough progresses.  Check his LFTs and hemoccult.  Type and  cross about 2 units of packed red blood cells and transfuse some 2 units  if hematocrit drops below 25.  Placed PPD today.  SCD boost for DVT  prophylaxis, Protonix 40 mg IV daily, albuterol and Atrovent q.6 h.  standing, q.3 h. p.r.n.; Zofran 4 mg IV q.6 h. p.r.n., Tylenol  alternatively with oxycodone and Dilaudid p.r.n. for pain management,  Robitussin 5 mL q.4 h. p.r.n. for cough.   LABORATORY DATA IN THE MORNING:  ABG and lactic acid level also with  chest x-ray to be repeated in the morning.      Herbie Saxon, MD  Electronically Signed     MIO/MEDQ  D:  01/28/2008  T:  01/29/2008  Job:   [id number]
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
## 2010-09-17 NOTE — Assessment & Plan Note (Signed)
OFFICE VISIT   [name], [name]  DOB:  18-Sep-1955                                        April 21, 2008  CHART #:  [id number]   CURRENT PROBLEMS:  1. Status post right VATS and decortication for loculated pleural      effusion January 31, 2008.  2. Chronic obstructive pulmonary disease.   PRESENT ILLNESS:  The patient returns for his final office visit almost  a month after surgery.  He is breathing much better and he has no  pulmonary symptoms.  He is working full-time and he still needs some  pain medication, but is willing to substitute the Vicodin for Ultram at  this point.  The incisions have all healed.   PHYSICAL EXAMINATION:  VITAL SIGNS:  Blood pressure 120/80, pulse 80,  respirations 18, and saturation 99% on room air.  LUNGS:  Breath sounds are clear and equal.  CHEST:  The thoracotomy incisions are well healed.  EXTREMITIES:  He has no limitation of the upper extremity range of  motion.   PA and lateral chest x-ray reveals clear lung fields.  No residual  effusion with mild scarring of the right lung base.  Overall, aeration  is basically normal.   PLAN:  The patient will return as needed.  He is given one prescription  for Ultram for incisional pain.  He is recommended to take the seasonal  influenza vaccine annually.   Kerin Perna, M.D.  Electronically Signed   PV/MEDQ  D:  04/21/2008  T:  04/22/2008  Job:  [id number]
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
## 2010-09-20 NOTE — Discharge Summary (Signed)
[name], [name] NO.:  [id number]   MEDICAL RECORD NO.:  [id number]          PATIENT TYPE:  INP   LOCATION:  5735                         FACILITY:  [facility]   PHYSICIAN:  Gita Kudo, M.D. DATE OF BIRTH:  1956/04/18   DATE OF ADMISSION:  05/26/2005  DATE OF DISCHARGE:  05/31/2005                                 DISCHARGE SUMMARY   CHIEF COMPLAINT AND REASON FOR ADMISSION:  [name] is a 55 year old male  patient with a 24-hour history of lower abdominal pain, finally localized to  the right lower quadrant.  CT scan of the abdomen and pelvis was performed.  There was a tubular structure seen in the right lower quadrant which may be  a dilated appendix but this is difficult to discretely visualize secondary  to crowding of bowel loops. Patient did present with leukocytosis, white  count 17,600 with neutrophils 89%.  On exam,  his abdomen was distended and  he had positive tenderness in the right lower quadrant.  He had low grade  temperature of 99.4 and was otherwise stable from vital sign prospective.  Patient was admitted by Dr. Lindie Spruce with a diagnosis of possible early  appendicitis.   HOSPITAL COURSE:  As noted, patient was admitted with a diagnosis of  possible acute appendicitis, early.  He was started on IV fluids and made  NPO and after careful consideration, Dr. Lindie Spruce decided to go ahead and take  him to the operating room, where he underwent a laparoscopic appendectomy on  May 27, 2005.  He was found to have severe periappendicitis without  actual perforation.   During the postoperative period, patient did well.  By the first  postoperative day, he was eating eggs but felt somewhat full and the food  was stuck, he pointed to his neck.  He was having some increase in pain  despite being on oral pain medication.  His abdomen was distended.  He did  not have any bowel sounds.  He was tympanitic to percussion and tender at  the umbilical region  but was not experiencing any rebound.  At this point,  it was felt that the patient had a postoperative ileus.  His diet was  decreased to full liquids and IV fluids were increased to 150 an hour.  Oral  pain medications were continued and IV Toradol was added.   By the following day, postoperative day #2, patient had significant  recurrent emesis during the night with a sensation of fullness, although he  was passing gas.  His potassium was down to 3.2, white count was 8100.  Abdomen remained distended but now he was experiencing bowel sounds.  He was  tender over his incisional sites.  He was placed on NPO status for bowel  rest with resolution of the ileus.  Oral pain medications were stopped and  he was put back on IV Dilaudid and continued on Toradol.   By postoperative day #3, he had decreased abdominal pain.  He was  experiencing bowel movements.  His abdomen was soft and less distended and  it was felt that his  ileus had resolved.  His potassium was up to 4.  His IV  fluids were saline locked.  His O2 was discontinued and his diet was  advanced as tolerated.   By postoperative day #4, May 31, 2005, patient was comfortable.  He was  out of bed.  He was having bowel movements.  He was tolerating regular diet  and was deemed appropriate for discharge by Dr. Maryagnes Amos.   FINAL DISCHARGE DIAGNOSES:  1.  Appendicitis status post laparoscopic appendectomy.  2.  Postoperative ileus, resolved.  3.  Hypokalemia, resolved.   DISCHARGE MEDICATION:  Vicodin 5/500 one to two tablets q.4h. p.r.n. pain.   DIET:  No restrictions and encourage p.o. fluid intake.   ACTIVITY:  Increase activity slowly.   WOUND CARE:  Allow Steri-Strips to fall off.   FOLLOW UP APPOINTMENTS:  He is to call Dr. Dixon Boos office at [phone] with  any questions or concerns.  He has an appointment scheduled on June 17, 2005, at 9:15 in the morning.      Allison L. Rennis Harding, N.P.     ______________________________  Gita Kudo, M.D.    ALE/MEDQ  D:  07/15/2005  T:  07/16/2005  Job:  [id number]

## 2010-09-20 NOTE — Op Note (Signed)
[name], [name] NO.:  [id number]   MEDICAL RECORD NO.:  [id number]          PATIENT TYPE:  INP   LOCATION:  5735                         FACILITY:  [facility]   PHYSICIAN:  Cherylynn Ridges, M.D.    DATE OF BIRTH:  Sep 04, 1955   DATE OF PROCEDURE:  05/27/2005  DATE OF DISCHARGE:                                 OPERATIVE REPORT   PREOP DIAGNOSIS:  Acute appendicitis.   POSTOP DIAGNOSIS:  Severe periappendicitis and acute appendicitis with  suppuration.   PROCEDURE:  Laparoscopic appendectomy.   SURGEON:  Dr. Lindie Spruce.   ASSISTANT:  None.   ANESTHESIA:  General endotracheal.   ESTIMATED BLOOD LOSS:  Less than 30 mL.   COMPLICATIONS:  None.   CONDITION:  Stable.   FINDINGS:  The patient had severe periappendicitis with the terminal ileum  wrapped over the very diseased appendix which did not have any free rupture  although a picture was taken of some of the pelvic cul-de-sac fluid which  was purulent.   INDICATIONS FOR OPERATION:  The patient is a 55 year old with persistent  lower abdominal pain with a white count 19,000, although CT scan was  originally read as not showing acute appendicitis but consistent with a  gastroenteritis. Because of the insistence of the emergency room physician  that this patient had a more severe problems than was felt to be shown by CT  scan, a surgical consultation was requested and upon review of the CT scan,  thought there was a possibility that the distended, diseased appendix made  been seen. He was taken to surgery based on clinical grounds primarily.   OPERATION:  The patient was taken to the operating room, placed on table in  supine position. After an adequate endotracheal anesthetic was administered,  he was prepped and draped in usual sterile manner exposing the midline and  right upper quadrant.   A supraumbilical curvilinear incision was made using #11 blade and taken  down to the midline fascia. Through the  midline fascia a Veress needle was  passed into the peritoneal cavity and confirmed in position using the saline  test. Once this was done, carbon dioxide insufflation was instilled into the  peritoneal cavity up to maximal intra-abdominal pressure of 15 mmHg.   Once this was done, an OptiVu 10-mm cannula was passed through the  supraumbilical fascia into the peritoneal cavity under direct vision. Once  it was in place, a right costal margin 5-mm cannula and a suprapubic 11/12  mm cannula passed under direct vision. The patient was placed in  Trendelenburg with the left side tilted down and the dissection begun.   An inflammatory process could be seen immediately as there was purulent  peritoneal fluid in the pelvis. The omentum was tucked around the  inflammatory process in the right lower quadrant and as we were able to  mobilize it, we could see the terminal ileum wrapped over a diseased  appendix.   With careful dissection, we were able to mobilize the appendix out to the  base of the cecum where we subsequently came across with an  Endo-GIA with  3.5-mm closure staples. We then isolated the mesoappendix and came across  its base using an Endo GIA 2.5-mm stapler. Once this was done, the appendix  was completely detached and we were able to bring it out from the suprapubic  site with minimal difficulty. We irrigated with 3 liters of saline solution,  found there to be no acute bleeding except for some mild peritoneal lining  bleeding. We tried to aspirate most of the purulent fluid from the pelvis  irrigating with most of the 3 liters of fluid. Once this was done, we able  to close after removing all cannulas from the sites and aspirating all fluid  and gases.   The supraumbilical fascia defect was closed using a figure-of-eight stitch  of 0 Vicryl. The skin at all sites were closed using running subcuticular  stitch of 5-0 Vicryl after injecting 0.25% Marcaine with epi at all  sites.  Sterile dressings were applied. All needle counts, sponge counts and  instrument counts were correct.      Cherylynn Ridges, M.D.  Electronically Signed     JOW/MEDQ  D:  05/27/2005  T:  05/27/2005  Job:  [id number]
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
## 2011-02-03 LAB — EXPECTORATED SPUTUM ASSESSMENT W GRAM STAIN, RFLX TO RESP C

## 2011-02-03 LAB — COMPREHENSIVE METABOLIC PANEL
ALT: 30
ALT: 42
AST: 33
Albumin: 1.6 — ABNORMAL LOW
Alkaline Phosphatase: 131 — ABNORMAL HIGH
Alkaline Phosphatase: 179 — ABNORMAL HIGH
BUN: 15
BUN: 8
CO2: 27
CO2: 32
Calcium: 7.5 — ABNORMAL LOW
Chloride: 101
Chloride: 92 — ABNORMAL LOW
Creatinine, Ser: 0.76
GFR calc Af Amer: >60
GFR calc non Af Amer: >60
GFR calc non Af Amer: >60
Glucose, Bld: 149 — ABNORMAL HIGH
Glucose, Bld: 95
Potassium: 3.4 — ABNORMAL LOW
Potassium: 3.9
Sodium: 133 — ABNORMAL LOW
Sodium: 140
Total Bilirubin: 1
Total Bilirubin: 1.9 — ABNORMAL HIGH
Total Protein: 5.2 — ABNORMAL LOW
Total Protein: 6

## 2011-02-03 LAB — CBC
HCT: 29.8 — ABNORMAL LOW
HCT: 31.7 — ABNORMAL LOW
HCT: 34 — ABNORMAL LOW
HCT: 36.9 — ABNORMAL LOW
HCT: 28 — ABNORMAL LOW
HCT: 28.8 — ABNORMAL LOW
Hemoglobin: 10.2 — ABNORMAL LOW
Hemoglobin: 11 — ABNORMAL LOW
Hemoglobin: 12 — ABNORMAL LOW
Hemoglobin: 12.8 — ABNORMAL LOW
Hemoglobin: 9.9 — ABNORMAL LOW
Hemoglobin: 9.9 — ABNORMAL LOW
MCHC: 34.1
MCHC: 34.3
MCHC: 34.6
MCHC: 34.8
MCHC: 35.3
MCHC: 33.9
MCHC: 35.3
MCV: 93.2
MCV: 93.2
MCV: 92.4
MCV: 94.1
MCV: 94.2
Platelets: 354
Platelets: 434 — ABNORMAL HIGH
Platelets: 512 — ABNORMAL HIGH
Platelets: 602 — ABNORMAL HIGH
RBC: 3.2 — ABNORMAL LOW
RBC: 3.4 — ABNORMAL LOW
RBC: 3.99 — ABNORMAL LOW
RBC: 4.32
RBC: 2.93 — ABNORMAL LOW
RBC: 3.03 — ABNORMAL LOW
RBC: 3.06 — ABNORMAL LOW
RDW: 13.3
RDW: 13.4
RDW: 13.6
RDW: 13.7
RDW: 13.9
RDW: 13.2
WBC: 16.2 — ABNORMAL HIGH
WBC: 26.2 — ABNORMAL HIGH
WBC: 33.5 — ABNORMAL HIGH
WBC: 22.7 — ABNORMAL HIGH
WBC: 25.3 — ABNORMAL HIGH

## 2011-02-03 LAB — BASIC METABOLIC PANEL
BUN: 8
BUN: 8
BUN: 11
BUN: 11
BUN: 9
CO2: 29
CO2: 31
CO2: 27
CO2: 29
Calcium: 7.5 — ABNORMAL LOW
Calcium: 7.8 — ABNORMAL LOW
Calcium: 7.8 — ABNORMAL LOW
Calcium: 7.9 — ABNORMAL LOW
Chloride: 95 — ABNORMAL LOW
Chloride: 101
Chloride: 95 — ABNORMAL LOW
Chloride: 97
Creatinine, Ser: 0.75
Creatinine, Ser: 0.75
Creatinine, Ser: 0.85
GFR calc Af Amer: >60
GFR calc Af Amer: >60
GFR calc non Af Amer: >60
GFR calc non Af Amer: >60
GFR calc non Af Amer: >60
Glucose, Bld: 121 — ABNORMAL HIGH
Glucose, Bld: 123 — ABNORMAL HIGH
Glucose, Bld: 109 — ABNORMAL HIGH
Glucose, Bld: 98
Potassium: 3.9
Potassium: 4.1
Potassium: 4.1
Sodium: 134 — ABNORMAL LOW
Sodium: 136
Sodium: 133 — ABNORMAL LOW
Sodium: 136

## 2011-02-03 LAB — AFB CULTURE WITH SMEAR (NOT AT ARMC): Acid Fast Smear: NONE SEEN

## 2011-02-03 LAB — ANAEROBIC CULTURE
Gram Stain: NONE SEEN
Gram Stain: NONE SEEN

## 2011-02-03 LAB — VANCOMYCIN, TROUGH
Vancomycin Tr: 7.6
Vancomycin Tr: 31.8

## 2011-02-03 LAB — BLOOD GAS, ARTERIAL
Acid-Base Excess: 3.8 — ABNORMAL HIGH
Acid-Base Excess: 6.7 — ABNORMAL HIGH
Bicarbonate: 27.8 — ABNORMAL HIGH
Bicarbonate: 28.3 — ABNORMAL HIGH
Bicarbonate: 30.7 — ABNORMAL HIGH
Drawn by: 296031
FIO2: 0.21
O2 Content: 2
O2 Content: 5
O2 Saturation: 95.1
O2 Saturation: 99
Patient temperature: 98.6
Patient temperature: 98.6
Patient temperature: 99
TCO2: 28.9
TCO2: 29.7
TCO2: 32
pCO2 arterial: 44.5
pCO2 arterial: 46 — ABNORMAL HIGH
pH, Arterial: 7.406
pH, Arterial: 7.454 — ABNORMAL HIGH
pH, Arterial: 7.478 — ABNORMAL HIGH
pO2, Arterial: 184 — ABNORMAL HIGH
pO2, Arterial: 67.1 — ABNORMAL LOW
pO2, Arterial: 73.8 — ABNORMAL LOW

## 2011-02-03 LAB — CULTURE, RESPIRATORY W GRAM STAIN: Culture: NORMAL

## 2011-02-03 LAB — DIFFERENTIAL
Basophils Absolute: 0
Basophils Relative: 0
Basophils Relative: 0
Eosinophils Absolute: 0
Eosinophils Relative: 0
Lymphocytes Relative: 3 — ABNORMAL LOW
Lymphs Abs: 1
Lymphs Abs: 1.1
Monocytes Absolute: 2.3 — ABNORMAL HIGH
Monocytes Relative: 11
Monocytes Relative: 7
Neutro Abs: 13.4 — ABNORMAL HIGH
Neutro Abs: 30.2 — ABNORMAL HIGH
Neutrophils Relative %: 83 — ABNORMAL HIGH
Neutrophils Relative %: 90 — ABNORMAL HIGH

## 2011-02-03 LAB — CROSSMATCH
ABO/RH(D): A NEG
Antibody Screen: NEGATIVE

## 2011-02-03 LAB — POCT I-STAT 7, (LYTES, BLD GAS, ICA,H+H)
Bicarbonate: 29.9 — ABNORMAL HIGH
HCT: 31 — ABNORMAL LOW
Hemoglobin: 10.5 — ABNORMAL LOW
Sodium: 133 — ABNORMAL LOW
pCO2 arterial: 41.9
pH, Arterial: 7.462 — ABNORMAL HIGH
pO2, Arterial: 121 — ABNORMAL HIGH

## 2011-02-03 LAB — URINALYSIS, ROUTINE W REFLEX MICROSCOPIC
Leukocytes, UA: NEGATIVE
Nitrite: NEGATIVE
Specific Gravity, Urine: 1.03
pH: 6

## 2011-02-03 LAB — T3, FREE
T3, Free: 1.5 — ABNORMAL LOW (ref 2.3–4.2)
T3, Free: 2.1 — ABNORMAL LOW (ref 2.3–4.2)

## 2011-02-03 LAB — POCT I-STAT, CHEM 8
Chloride: 102
Glucose, Bld: 106 — ABNORMAL HIGH
HCT: 38 — ABNORMAL LOW
Potassium: 3.4 — ABNORMAL LOW

## 2011-02-03 LAB — CULTURE, BLOOD (ROUTINE X 2): Culture: NO GROWTH

## 2011-02-03 LAB — POCT I-STAT 3, ART BLOOD GAS (G3+)
pCO2 arterial: 39.9
pH, Arterial: 7.515 — ABNORMAL HIGH

## 2011-02-03 LAB — T4, FREE
Free T4: 0.96
Free T4: 1.34

## 2011-02-03 LAB — LEGIONELLA ANTIGEN, URINE: Legionella Antigen, Urine: NEGATIVE

## 2011-02-03 LAB — MYCOPLASMA PNEUMONIAE ANTIBODY, IGM: Mycoplasma pneumo IgM: 134 Units/mL (ref <770)

## 2011-02-03 LAB — BODY FLUID CULTURE: Culture: NO GROWTH

## 2011-02-03 LAB — URINE MICROSCOPIC-ADD ON

## 2011-02-03 LAB — HEPATIC FUNCTION PANEL
Albumin: 1.9 — ABNORMAL LOW
Albumin: 2.2 — ABNORMAL LOW
Bilirubin, Direct: 0.4 — ABNORMAL HIGH
Indirect Bilirubin: 1.3 — ABNORMAL HIGH
Total Bilirubin: 1.5 — ABNORMAL HIGH
Total Protein: 6.2

## 2011-02-03 LAB — VANCOMYCIN, RANDOM: Vancomycin Rm: 14.8

## 2011-02-03 LAB — URINE CULTURE

## 2011-02-03 LAB — TISSUE CULTURE

## 2011-02-03 LAB — HIV ANTIBODY (ROUTINE TESTING W REFLEX): HIV: NONREACTIVE

## 2011-02-03 LAB — CALCIUM: Calcium: 8.3 — ABNORMAL LOW

## 2011-02-03 LAB — SEDIMENTATION RATE: Sed Rate: 103 — ABNORMAL HIGH

## 2011-02-03 LAB — TSH: TSH: 0.075 — ABNORMAL LOW

## 2012-12-27 ENCOUNTER — Emergency Department (HOSPITAL_COMMUNITY)
Admission: EM | Admit: 2012-12-27 | Discharge: 2012-12-27 | Disposition: A | Discharge status: Home / Self Care | Payer: Self-pay | Attending: Emergency Medicine | Admitting: Emergency Medicine

## 2012-12-27 ENCOUNTER — Encounter (HOSPITAL_COMMUNITY): Payer: Self-pay | Admitting: *Deleted

## 2012-12-27 DIAGNOSIS — M545 Low back pain, unspecified: Secondary | ICD-10-CM | POA: Insufficient documentation

## 2012-12-27 DIAGNOSIS — Z8739 Personal history of other diseases of the musculoskeletal system and connective tissue: Secondary | ICD-10-CM | POA: Insufficient documentation

## 2012-12-27 DIAGNOSIS — R209 Unspecified disturbances of skin sensation: Secondary | ICD-10-CM | POA: Insufficient documentation

## 2012-12-27 DIAGNOSIS — M79671 Pain in right foot: Secondary | ICD-10-CM

## 2012-12-27 DIAGNOSIS — G8929 Other chronic pain: Secondary | ICD-10-CM | POA: Insufficient documentation

## 2012-12-27 DIAGNOSIS — M25579 Pain in unspecified ankle and joints of unspecified foot: Secondary | ICD-10-CM | POA: Insufficient documentation

## 2012-12-27 DIAGNOSIS — R21 Rash and other nonspecific skin eruption: Secondary | ICD-10-CM | POA: Insufficient documentation

## 2012-12-27 DIAGNOSIS — M79609 Pain in unspecified limb: Secondary | ICD-10-CM | POA: Insufficient documentation

## 2012-12-27 MED ORDER — PREDNISONE 20 MG PO TABS: 40.0000 mg | ORAL_TABLET | Freq: Every day | ORAL | Status: DC

## 2012-12-27 MED ORDER — HYDROCODONE-ACETAMINOPHEN 5-325 MG PO TABS: 1.0000 | ORAL_TABLET | Freq: Four times a day (QID) | ORAL | Status: DC | PRN

## 2012-12-27 NOTE — ED Provider Notes (Signed)
Medical screening examination/treatment/procedure(s) were performed by non-physician practitioner and as supervising physician I was immediately available for consultation/collaboration.   Bonnye Halle, MD 12/27/12 2324 

## 2012-12-27 NOTE — ED Notes (Signed)
Pt reports he has had a burning sensation over tops of toes for several weeks; pain is worsening today so that he cannot walk. Front of toes are reddened.

## 2012-12-27 NOTE — ED Notes (Signed)
Pt c/o burning to edge of toes to R foot.  Toes are red (resemle toes to L foot, but toes to L foot do not hurt).

## 2012-12-27 NOTE — ED Provider Notes (Signed)
CSN: [id number]     Arrival date & time 12/27/12  1720 History  This chart was scribed for a non-physician practitioner working with Bryan Racer, MD by Jiles Prows, ED scribe. This patient was seen in room TR08C/TR08C and the patient's care was started at 7:10 PM.  Chief Complaint  Patient presents with  . Foot Pain   The history is provided by the patient and medical records. No language interpreter was used.   HPI Comments: [name] is a 57 y.o. male who presents to the Emergency Department complaining of burning, moderate, constant, worsening pain to right leg onset 2 weeks ago.  He states that walking on his foot exacerbates the pain.  He claims associated pain in low back, but states that he had a ruptured disc a few years ago.  Pt reports h/o chronic left sided leg pain, but states prior to this, he has never had pain in right leg.  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  Pt also complains of rash to right lower leg that began "a while ago." He states that he has been limping due to pain. Pt denies headache, diaphoresis, fever, chills, nausea, vomiting, diarrhea, weakness, cough, SOB, traumatic injury, fall, and any other pain.  Pt reports taking Advil with no relief.  He reports h/o low back spasms.  He states previous epidural shot offered relief from lower back pain for 3 years.   History reviewed. No pertinent past medical history. Past Surgical History  Procedure Laterality Date  . Mandible fracture surgery    . Cervical spine surgery    . Chest tube insertion      pnx tx   No family history on file. History  Substance Use Topics  . Smoking status: Passive Smoke Exposure - Never Smoker  . Smokeless tobacco: Not on file  . Alcohol Use: No    Review of Systems  Musculoskeletal: Positive for back pain.  All other systems reviewed and are negative.    Allergies  Review of patient's allergies indicates no known allergies.  Home Medications    Current Outpatient Rx  Name  Route  Sig  Dispense  Refill  . ibuprofen (ADVIL,MOTRIN) 100 MG tablet   Oral   Take 800 mg by mouth every 6 (six) hours as needed for fever.          BP 158/68  Pulse 77  Temp(Src) 98.1 F (36.7 C) (Oral)  Resp 18  SpO2 95% Physical Exam  Nursing note and vitals reviewed. Constitutional: He is oriented to person, place, and time. He appears well-developed and well-nourished. No distress.  HENT:  Head: Normocephalic and atraumatic.  Eyes: EOM are normal.  Neck: Neck supple. No tracheal deviation present.  Cardiovascular: Normal rate and intact distal pulses.   CR<3.  Pulmonary/Chest: Effort normal. No respiratory distress.  Musculoskeletal: Normal range of motion.  No bony abnormalities or deformities.  Neurological: He is alert and oriented to person, place, and time.  Sensation and strength intact.  Skin: Skin is warm and dry.  Tinea infection of the toenails.  Psychiatric: He has a normal mood and affect. His behavior is normal.    ED Course  Procedures (including critical care time) DIAGNOSTIC STUDIES: Filed Vitals:   12/27/12 1741  BP: 158/68  Pulse: 77  Temp: 98.1 F (36.7 C)  TempSrc: Oral  Resp: 18  SpO2: 95%    COORDINATION OF CARE: 7:13 PM - Discussed ED treatment with pt  at bedside including steroid course, and pt agreed.  Will write script for pain medication for sleeping.  Advised pt that once steroids kick in, there will be some relief.  Labs Review Labs Reviewed - No data to display Imaging Review No results found.  MDM   1. Foot pain, right    Patient with burning sensation in the toes. I suspect this is likely a neuropathy, possibly related to previous known herniated disc. Patient has followup with Dr. Jeral Fruit. No evidence of lower extremity DVT, or cellulitis. No concerning mechanism of injury to indicate trauma. No evidence of gout. Will treat as a radiculopathy, and recommend followup. Patient understands  and agrees with the plan. He is stable and ready for discharge.   I personally performed the services described in this documentation, which was scribed in my presence. The recorded information has been reviewed and is accurate.       Roxy Horseman, PA-C 12/27/12 1925

## 2012-12-30 ENCOUNTER — Emergency Department (HOSPITAL_COMMUNITY)
Admission: EM | Admit: 2012-12-30 | Discharge: 2012-12-30 | Disposition: A | Discharge status: Home / Self Care | Payer: Self-pay | Attending: Emergency Medicine | Admitting: Emergency Medicine

## 2012-12-30 ENCOUNTER — Encounter (HOSPITAL_COMMUNITY): Payer: Self-pay | Admitting: *Deleted

## 2012-12-30 DIAGNOSIS — M722 Plantar fascial fibromatosis: Secondary | ICD-10-CM | POA: Insufficient documentation

## 2012-12-30 DIAGNOSIS — G629 Polyneuropathy, unspecified: Secondary | ICD-10-CM

## 2012-12-30 MED ORDER — OXYCODONE-ACETAMINOPHEN 5-325 MG PO TABS: 1.0000 | ORAL_TABLET | Freq: Three times a day (TID) | ORAL | Status: DC | PRN

## 2012-12-30 MED ORDER — NAPROXEN 500 MG PO TABS: 500.0000 mg | ORAL_TABLET | Freq: Two times a day (BID) | ORAL | Status: DC

## 2012-12-30 NOTE — ED Provider Notes (Signed)
CSN: [id number]     Arrival date & time 12/30/12  1709 History  This chart was scribed for non-physician practitioner Raymon Mutton, PA-C working with Richardean Canal, MD by Danella Maiers, ED Scribe. This patient was seen in room TR06C/TR06C and the patient's care was started at 5:34 PM.    Chief Complaint  Patient presents with  . Foot Pain   The history is provided by the patient. No language interpreter was used.   HPI Comments: [name] is a 57 y.o. male who presents to the Emergency Department complaining of moderate, intermittent, worsening, burning right foot pain localized in the toes onset 2 weeks ago. Pt denies recent falls and injuries. Pt claims the pain is worse while standing. He states the pain is better when he can rest his legs or elevate the foot. Pt was seen here 3 days ago (8/25) for the same symptoms and was given prednisone and hydrocodone. Pt states the hydrocodone helped the pain but he has run out. Pt denies redness, swelling, hyperalgesia, blisters, drainage, numbness and tingling, loss of sensation, fever, and chills. Pt has an apt with Dr. Jeral Fruit.   History reviewed. No pertinent past medical history. Past Surgical History  Procedure Laterality Date  . Mandible fracture surgery    . Cervical spine surgery    . Chest tube insertion      pnx tx   No family history on file. History  Substance Use Topics  . Smoking status: Passive Smoke Exposure - Never Smoker  . Smokeless tobacco: Not on file  . Alcohol Use: No    Review of Systems  Constitutional: Negative for fever and chills.  Musculoskeletal: Positive for myalgias (right foot pain).  Neurological: Negative for numbness.  All other systems reviewed and are negative.    Allergies  Review of patient's allergies indicates no known allergies.  Home Medications   Current Outpatient Rx  Name  Route  Sig  Dispense  Refill  . HYDROcodone-acetaminophen (NORCO/VICODIN) 5-325 MG per tablet   Oral   Take 1 tablet by mouth every 6 (six) hours as needed for pain.   13 tablet   0   . ibuprofen (ADVIL,MOTRIN) 100 MG tablet   Oral   Take 800 mg by mouth every 6 (six) hours as needed for fever.         . predniSONE (DELTASONE) 20 MG tablet   Oral   Take 2 tablets (40 mg total) by mouth daily.   10 tablet   0   . naproxen (NAPROSYN) 500 MG tablet   Oral   Take 1 tablet (500 mg total) by mouth 2 (two) times daily.   30 tablet   0   . oxyCODONE-acetaminophen (PERCOCET/ROXICET) 5-325 MG per tablet   Oral   Take 1 tablet by mouth every 8 (eight) hours as needed for pain.   5 tablet   0    BP 139/81  Temp(Src) 98 F (36.7 C) (Oral)  Resp 18  Ht 5\' 11"  (1.803 m)  Wt 150 lb (68.04 kg)  BMI 20.93 kg/m2  SpO2 97% Physical Exam  Nursing note and vitals reviewed. Constitutional: He is oriented to person, place, and time. He appears well-developed and well-nourished. No distress.  HENT:  Head: Normocephalic and atraumatic.  Neck: Normal range of motion. Neck supple.  Cardiovascular: Normal rate, regular rhythm and normal heart sounds.  Exam reveals no friction rub.   No murmur heard. Pulses:      Radial pulses  are 2+ on the right side, and 2+ on the left side.       Dorsalis pedis pulses are 2+ on the right side, and 2+ on the left side.  Negative ankle and leg swelling bilaterally Negative pitting edema bilaterally Negative pain upon palpation to calf - negative Homans sign bilaterally  Pulmonary/Chest: Effort normal and breath sounds normal. No respiratory distress. He has no wheezes. He has no rales.  Musculoskeletal: Normal range of motion. He exhibits tenderness.  Negative swelling, erythema, deformity, ecchymosis noted to the right digits of the right foot. Full range of motion to the right digits, right foot, right ankle. Patient able to bear weight on the right foot. Full flexion and extension of the right digits. Mild discomfort noted upon palpation to the plantar  aspect of the right foot and digits.  Neurological: He is alert and oriented to person, place, and time. He exhibits normal muscle tone. Coordination normal.  Sensation intact to right lower extremities with differentiation to sharp and dull touch Strength 5+/5+ to bilateral lower extremities-equal distribution Patient able to stand on foot, able to bear weight on right foot-gait balanced and proper  Skin: Skin is warm and dry. No rash noted. He is not diaphoretic. No erythema.  Psychiatric: He has a normal mood and affect. His behavior is normal. Thought content normal.    ED Course  Procedures (including critical care time)  Medications - No data to display  DIAGNOSTIC STUDIES: Oxygen Saturation is 97% on room air, normal by my interpretation.    COORDINATION OF CARE: 6:49 PM- Discussed treatment plan with pt and pt agrees to plan.    Labs Review Labs Reviewed - No data to display Imaging Review No results found.  MDM   1. Plantar fasciitis   2. Neuropathy    I personally performed the services described in this documentation, which was scribed in my presence. The recorded information has been reviewed and is accurate.   Patient presenting to the emergency department with burning sensation to the toes of the right foot has been ongoing for the past 2 weeks. 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 Alert and oriented. Negative erythema, swelling, ecchymosis, deformities noted to the right foot and digits. Full range of motion to the right toes-full flexion and extension. Sensation intact. Pulses palpable. Strength intact and equal distribution. Doubt gout. Doubt septic joint. Doubt cardiac nature. Possible plantar fasciitis. Suspicion to be neuropathy nature, possible beginnings of diabetes. Patient stable, afebrile. Discharge patient with small dose of pain medications and anti-inflammatories. Referred patient to orthopedics for followup and [facility]. Discussed with patient proper footwear especially since patient is constantly on his feet. Discussed with patient to continue to monitor symptoms and if symptoms are to worsen or change to report back to emergency department-strict return instructions given. Patient agreed to plan of care, understood, all questions answered.  Raymon Mutton, PA-C 12/31/12 1243  Kalyn Dimattia, PA-C 12/31/12 1244

## 2012-12-30 NOTE — ED Notes (Signed)
Pt tx here for R foot pain on Monday.  Was given prednisone and pain meds. Pt states pain meds ran out and the pain has not improved.

## 2013-01-02 NOTE — ED Provider Notes (Signed)
Medical screening examination/treatment/procedure(s) were performed by non-physician practitioner and as supervising physician I was immediately available for consultation/collaboration.   Richardean Canal, MD 01/02/13 2122

## 2014-07-29 ENCOUNTER — Encounter (HOSPITAL_COMMUNITY): Payer: Self-pay | Admitting: Emergency Medicine

## 2014-07-29 ENCOUNTER — Emergency Department (HOSPITAL_COMMUNITY)
Admission: EM | Admit: 2014-07-29 | Discharge: 2014-07-30 | Disposition: A | Discharge status: Home / Self Care | Payer: Self-pay | Attending: Emergency Medicine | Admitting: Emergency Medicine

## 2014-07-29 ENCOUNTER — Emergency Department (HOSPITAL_COMMUNITY): Payer: Self-pay

## 2014-07-29 DIAGNOSIS — S60222A Contusion of left hand, initial encounter: Secondary | ICD-10-CM

## 2014-07-29 DIAGNOSIS — Y998 Other external cause status: Secondary | ICD-10-CM | POA: Insufficient documentation

## 2014-07-29 DIAGNOSIS — Z7952 Long term (current) use of systemic steroids: Secondary | ICD-10-CM | POA: Insufficient documentation

## 2014-07-29 DIAGNOSIS — Y9389 Activity, other specified: Secondary | ICD-10-CM | POA: Insufficient documentation

## 2014-07-29 DIAGNOSIS — S60221A Contusion of right hand, initial encounter: Secondary | ICD-10-CM

## 2014-07-29 DIAGNOSIS — Z791 Long term (current) use of non-steroidal anti-inflammatories (NSAID): Secondary | ICD-10-CM | POA: Insufficient documentation

## 2014-07-29 DIAGNOSIS — W228XXA Striking against or struck by other objects, initial encounter: Secondary | ICD-10-CM | POA: Insufficient documentation

## 2014-07-29 DIAGNOSIS — S6000XA Contusion of unspecified finger without damage to nail, initial encounter: Secondary | ICD-10-CM

## 2014-07-29 DIAGNOSIS — Y9289 Other specified places as the place of occurrence of the external cause: Secondary | ICD-10-CM | POA: Insufficient documentation

## 2014-07-29 DIAGNOSIS — S60021A Contusion of right index finger without damage to nail, initial encounter: Secondary | ICD-10-CM | POA: Insufficient documentation

## 2014-07-29 DIAGNOSIS — S60022A Contusion of left index finger without damage to nail, initial encounter: Secondary | ICD-10-CM | POA: Insufficient documentation

## 2014-07-29 NOTE — ED Notes (Signed)
Patient states was playing bongo drums and hit the edge of the drum on bilateral index fingers.

## 2014-07-29 NOTE — ED Provider Notes (Signed)
CSN: [id number]     Arrival date & time 07/29/14  2317 History  This chart was scribed for Dione Boozeavid Deonte Otting, MD by Modena JanskyAlbert Thayil, ED Scribe. This patient was seen in room APAH2/APAH2 and the patient's care was started at 11:55 PM.   Chief Complaint  Patient presents with  . Finger Injury   The history is provided by the patient. No language interpreter was used.    HPI Comments: [name] is a 59 y.o. male who presents to the Emergency Department complaining of a finger injury that occurred today. He reports he injured his bilateral index fingers while playing bongo drums. He states that he has pain and swelling to his bilateral index fingers. He currently rates the severity of the pain as a 8/10.   History reviewed. No pertinent past medical history. Past Surgical History  Procedure Laterality Date  . Mandible fracture surgery    . Cervical spine surgery    . Chest tube insertion      pnx tx   No family history on file. History  Substance Use Topics  . Smoking status: Passive Smoke Exposure - Never Smoker  . Smokeless tobacco: Not on file  . Alcohol Use: No    Review of Systems  Musculoskeletal: Positive for myalgias and joint swelling.  All other systems reviewed and are negative.   Allergies  Review of patient's allergies indicates no known allergies.  Home Medications   Prior to Admission medications   Medication Sig Start Date End Date Taking? Authorizing Provider  ibuprofen (ADVIL,MOTRIN) 100 MG tablet Take 800 mg by mouth every 6 (six) hours as needed for fever.   Yes Historical Provider, MD  HYDROcodone-acetaminophen (NORCO/VICODIN) 5-325 MG per tablet Take 1 tablet by mouth every 6 (six) hours as needed for pain. 12/27/12   Roxy Horsemanobert Browning, PA-C  naproxen (NAPROSYN) 500 MG tablet Take 1 tablet (500 mg total) by mouth 2 (two) times daily. 12/30/12   Marissa Sciacca, PA-C  oxyCODONE-acetaminophen (PERCOCET/ROXICET) 5-325 MG per tablet Take 1 tablet by mouth every 8  (eight) hours as needed for pain. 12/30/12   Marissa Sciacca, PA-C  predniSONE (DELTASONE) 20 MG tablet Take 2 tablets (40 mg total) by mouth daily. 12/27/12   Roxy Horsemanobert Browning, PA-C   BP 173/79 mmHg  Pulse 64  Temp(Src) 97.8 F (36.6 C) (Oral)  Resp 16  Ht 5\' 11"  (1.803 m)  Wt 155 lb (70.308 kg)  BMI 21.63 kg/m2  SpO2 100% Physical Exam  Constitutional: He is oriented to person, place, and time. He appears well-developed and well-nourished. No distress.  HENT:  Head: Normocephalic and atraumatic.  Eyes: Pupils are equal, round, and reactive to light.  Neck: Normal range of motion. Neck supple. No JVD present.  Cardiovascular: Normal rate, regular rhythm and normal heart sounds.   No murmur heard. Pulmonary/Chest: Effort normal and breath sounds normal. He has no wheezes. He has no rales.  Abdominal: Soft. Bowel sounds are normal. He exhibits no distension and no mass. There is no tenderness.  Musculoskeletal: Normal range of motion. He exhibits no edema.  Ecchymosis over the flexor surface of digits 2-5 on both ends. Mild TTP through the middle phalanx of the left 2nd finger proximately. NV intact.   Lymphadenopathy:    He has no cervical adenopathy.  Neurological: He is alert and oriented to person, place, and time. No cranial nerve deficit. Coordination normal.  Skin: Skin is warm and dry. No rash noted.  Psychiatric: He has a normal mood  and affect. His behavior is normal. Judgment and thought content normal.  Nursing note and vitals reviewed.   ED Course  Procedures (including critical care time) DIAGNOSTIC STUDIES: Oxygen Saturation is 100% on RA, normal by my interpretation.    COORDINATION OF CARE: 11:59 PM- Pt advised of plan for treatment and pt agrees.  Imaging Review Dg Finger Index Left  07/29/2014   CLINICAL DATA:  Bilateral index finger pain. Redness, swelling in PIP joints. Hit the rim of a drum tonight.  EXAM: LEFT INDEX FINGER 2+V  COMPARISON:  None.   FINDINGS: No acute bony abnormality. Specifically, no fracture, subluxation, or dislocation. Soft tissues are intact.  IMPRESSION: No acute bony abnormality.   Electronically Signed   By: Charlett Nose M.D.   On: 07/29/2014 23:50   Dg Finger Index Right  07/29/2014   CLINICAL DATA:  Bilateral index finger pain, redness, and swelling to proximal interphalangeal joint. Injury playing drums earlier this day.  EXAM: RIGHT INDEX FINGER 2+V  COMPARISON:  None.  FINDINGS: No fracture or dislocation. The alignment and joint spaces are maintained. Mild degenerative change. No focal soft tissue abnormality.  IMPRESSION: No fracture or dislocation of the right index finger.   Electronically Signed   By: Rubye Oaks M.D.   On: 07/29/2014 23:50   Images viewed by me.   MDM   Final diagnoses:  Contusion of multiple sites of hand and fingers, left, initial encounter  Contusion of multiple sites of hand and fingers, right, initial encounter    Bruce's 2 fingers of both hands. No evidence of any more serious injury. X-rays are negative for fracture. He is advised to apply ice and use over-the-counter analgesics as needed for pain.  I personally performed the services described in this documentation, which was scribed in my presence. The recorded information has been reviewed and is accurate.       Dione Booze, MD 07/30/14 0030

## 2014-07-30 NOTE — ED Notes (Signed)
Discharge instructions given and reviewed with patient.  Patient verbalized understanding to take Ibuprofen, Tylenol or Aleve.  Patient ambulatory; discharged home in good condition.

## 2014-07-30 NOTE — Discharge Instructions (Signed)
Take acetaminophen, ibuprofen or naproxen as needed for pain.   Contusion A contusion is a deep bruise. Contusions are the result of an injury that caused bleeding under the skin. The contusion may turn blue, purple, or yellow. Minor injuries will give you a painless contusion, but more severe contusions may stay painful and swollen for a few weeks.  CAUSES  A contusion is usually caused by a blow, trauma, or direct force to an area of the body. SYMPTOMS   Swelling and redness of the injured area.  Bruising of the injured area.  Tenderness and soreness of the injured area.  Pain. DIAGNOSIS  The diagnosis can be made by taking a history and physical exam. An X-ray, CT scan, or MRI may be needed to determine if there were any associated injuries, such as fractures. TREATMENT  Specific treatment will depend on what area of the body was injured. In general, the best treatment for a contusion is resting, icing, elevating, and applying cold compresses to the injured area. Over-the-counter medicines may also be recommended for pain control. Ask your caregiver what the best treatment is for your contusion. HOME CARE INSTRUCTIONS   Put ice on the injured area.  Put ice in a plastic bag.  Place a towel between your skin and the bag.  Leave the ice on for 15-20 minutes, 3-4 times a day, or as directed by your health care provider.  Only take over-the-counter or prescription medicines for pain, discomfort, or fever as directed by your caregiver. Your caregiver may recommend avoiding anti-inflammatory medicines (aspirin, ibuprofen, and naproxen) for 48 hours because these medicines may increase bruising.  Rest the injured area.  If possible, elevate the injured area to reduce swelling. SEEK IMMEDIATE MEDICAL CARE IF:   You have increased bruising or swelling.  You have pain that is getting worse.  Your swelling or pain is not relieved with medicines. MAKE SURE YOU:   Understand these  instructions.  Will watch your condition.  Will get help right away if you are not doing well or get worse. Document Released: 01/29/2005 Document Revised: 04/26/2013 Document Reviewed: 02/24/2011 [facility] Patient Information 2015 [place], [place]. This information is not intended to replace advice given to you by your health care provider. Make sure you discuss any questions you have with your health care provider.

## 2016-04-16 ENCOUNTER — Encounter (HOSPITAL_COMMUNITY): Payer: Self-pay | Admitting: Emergency Medicine

## 2016-04-16 ENCOUNTER — Emergency Department (HOSPITAL_COMMUNITY)
Admission: EM | Admit: 2016-04-16 | Discharge: 2016-04-16 | Disposition: A | Discharge status: Home / Self Care | Payer: Self-pay | Attending: Emergency Medicine | Admitting: Emergency Medicine

## 2016-04-16 DIAGNOSIS — L853 Xerosis cutis: Secondary | ICD-10-CM

## 2016-04-16 DIAGNOSIS — Z79899 Other long term (current) drug therapy: Secondary | ICD-10-CM | POA: Insufficient documentation

## 2016-04-16 DIAGNOSIS — L03011 Cellulitis of right finger: Secondary | ICD-10-CM

## 2016-04-16 DIAGNOSIS — Z7722 Contact with and (suspected) exposure to environmental tobacco smoke (acute) (chronic): Secondary | ICD-10-CM | POA: Insufficient documentation

## 2016-04-16 MED ORDER — ACETAMINOPHEN 325 MG PO TABS
650.0000 mg | ORAL_TABLET | Freq: Once | ORAL | Status: AC
  Administered 2016-04-16: 650 mg via ORAL
  Filled 2016-04-16: qty 2

## 2016-04-16 MED ORDER — CEPHALEXIN 500 MG PO CAPS: 500.0000 mg | ORAL_CAPSULE | Freq: Three times a day (TID) | ORAL | 0 refills | Status: DC

## 2016-04-16 MED ORDER — CEPHALEXIN 500 MG PO CAPS
500.0000 mg | ORAL_CAPSULE | Freq: Once | ORAL | Status: AC
  Administered 2016-04-16: 500 mg via ORAL
  Filled 2016-04-16: qty 1

## 2016-04-16 MED ORDER — LIDOCAINE-EPINEPHRINE-TETRACAINE (LET) SOLUTION
3.0000 mL | Freq: Once | NASAL | Status: AC
  Administered 2016-04-16: 3 mL via TOPICAL
  Filled 2016-04-16: qty 3

## 2016-04-16 NOTE — ED Provider Notes (Signed)
[facility]-EMERGENCY DEPT Provider Note   CSN: [id number] Arrival date & time: 04/16/16  1425   By signing my name below, I, [name], attest that this documentation has been prepared under the direction and in the presence of Azucena Kubayler Leaphart, PA-C. Electronically Signed: Teofilo PodMatthew P. May, ED Scribe. 04/16/2016. 3:20 PM.   History   Chief Complaint Chief Complaint  Patient presents with  . Hand Pain    l/hand 4th finger  . Rash    foot rash x 6 months    The history is provided by the patient. No language interpreter was used.   HPI Comments:  [name] is a 60 y.o. male who presents to the Emergency Department complaining of constant pain/swelling to the nailbed of his left 4th finger x 1 week. Pt notes that he has been having dry skin on his hand and feet for several months. Pt used peroxide and antibiotic cream with no relief. Denies using chemicals or lotions. Pt denies fever, chills, purulent drainage, decreased rom, rash, nausea, vomiting, or any other complaints. Nothing makes better or worse.  History reviewed. No pertinent past medical history.  There are no active problems to display for this patient.   Past Surgical History:  Procedure Laterality Date  . CERVICAL SPINE SURGERY    . CHEST TUBE INSERTION     pnx tx  . MANDIBLE FRACTURE SURGERY         Home Medications    Prior to Admission medications   Medication Sig Start Date End Date Taking? Authorizing Provider  HYDROcodone-acetaminophen (NORCO/VICODIN) 5-325 MG per tablet Take 1 tablet by mouth every 6 (six) hours as needed for pain. 12/27/12   Roxy Horsemanobert Browning, PA-C  ibuprofen (ADVIL,MOTRIN) 100 MG tablet Take 800 mg by mouth every 6 (six) hours as needed for fever.    Historical Provider, MD  naproxen (NAPROSYN) 500 MG tablet Take 1 tablet (500 mg total) by mouth 2 (two) times daily. 12/30/12   Marissa Sciacca, PA-C  oxyCODONE-acetaminophen (PERCOCET/ROXICET) 5-325 MG per tablet Take 1 tablet  by mouth every 8 (eight) hours as needed for pain. 12/30/12   Marissa Sciacca, PA-C  predniSONE (DELTASONE) 20 MG tablet Take 2 tablets (40 mg total) by mouth daily. 12/27/12   Roxy Horsemanobert Browning, PA-C    Family History Family History  Problem Relation Age of Onset  . Cancer Mother     Social History Social History  Substance Use Topics  . Smoking status: Passive Smoke Exposure - Never Smoker  . Smokeless tobacco: Never Used  . Alcohol use No     Allergies   Patient has no known allergies.   Review of Systems Review of Systems  Constitutional: Negative for chills and fever.  Musculoskeletal: Negative.   Skin: Positive for wound.  All other systems reviewed and are negative.    Physical Exam Updated Vital Signs BP 144/87 (BP Location: Right Arm)   Pulse 65   Temp 97.8 F (36.6 C) (Oral)   Resp 18   SpO2 98%   Physical Exam  Constitutional: He appears well-developed and well-nourished. No distress.  HENT:  Head: Normocephalic and atraumatic.  Eyes: Conjunctivae are normal.  Cardiovascular: Normal rate.   Pulmonary/Chest: Effort normal.  Abdominal: He exhibits no distension.  Neurological: He is alert.  Skin: Skin is warm and dry. Capillary refill takes less than 2 seconds.  Xerosis noted to bilateral hands and feet. No signs of infection or rash. No wound noted.   Edema, erythema noted to  the base of the cuticle of the left index finger. Area of induration no area of fluctuance. Nail and cuticle are intact. No warmth noted. No purulent discharge. Full ROM of the left index finger.  Psychiatric: He has a normal mood and affect.  Nursing note and vitals reviewed.    ED Treatments / Results  DIAGNOSTIC STUDIES:  Oxygen Saturation is 98% on RA, normal by my interpretation.    COORDINATION OF CARE:  3:20 PM Discussed treatment plan with pt at bedside and pt agreed to plan.   Labs (all labs ordered are listed, but only abnormal results are displayed) Labs  Reviewed - No data to display  EKG  EKG Interpretation None       Radiology No results found.  Procedures .Marland Kitchen.Incision and Drainage Date/Time: 04/16/2016 11:40 PM Performed by: Demetrios LollLEAPHART, KENNETH T Authorized by: Demetrios LollLEAPHART, KENNETH T   Consent:    Consent obtained:  Verbal   Consent given by:  Patient   Risks discussed:  Bleeding, damage to other organs, infection, incomplete drainage and pain   Alternatives discussed:  No treatment Location:    Type:  Abscess   Location:  Upper extremity   Upper extremity location:  Finger   Finger location:  L index finger (paronychia) Pre-procedure details:    Skin preparation:  Betadine and antiseptic wash Anesthesia (see MAR for exact dosages):    Anesthesia method:  Topical application   Topical anesthetic:  LET Procedure type:    Complexity:  Simple Procedure details:    Incision types:  Stab incision   Incision depth:  Subungual   Scalpel blade:  11   Wound management:  Probed and deloculated and irrigated with saline   Drainage:  Purulent and bloody   Drainage amount:  Scant   Wound treatment:  Wound left open   Packing materials:  None Post-procedure details:    Patient tolerance of procedure:  Tolerated well, no immediate complications Comments:     Pain improved at small amount of purulent drainage noted.   (including critical care time)  Medications Ordered in ED Medications  lidocaine-EPINEPHrine-tetracaine (LET) solution (3 mLs Topical Given 04/16/16 1600)  acetaminophen (TYLENOL) tablet 650 mg (650 mg Oral Given 04/16/16 1600)  cephALEXin (KEFLEX) capsule 500 mg (500 mg Oral Given 04/16/16 1720)     Initial Impression / Assessment and Plan / ED Course  I have reviewed the triage vital signs and the nursing notes.  Pertinent labs & imaging results that were available during my care of the patient were reviewed by me and considered in my medical decision making (see chart for details).  Clinical Course     Patient presents to ED with xerosis to bilat hand and feet with small paronychia to the left index finger. No area of fluctuance. Small amount of erythema and edema noted. I&d performed with small amount of purulent discharge expressed. Cleaned and irrigated.  Wound recheck in 2 days. Encouraged home warm soaks and flushing.  Mild signs of cellulitis is surrounding skin.  Will d/c to home with keflex. Encouraged lotion to hands and feet to help with xerosis. Pt is hemodynamically stable, in NAD, & able to ambulate in the ED. Pain has been managed & has no complaints prior to dc. Pt is comfortable with above plan and is stable for discharge at this time. All questions were answered prior to disposition. Strict return precautions for f/u to the ED were discussed.    Final Clinical Impressions(s) / ED Diagnoses  Final diagnoses:  Paronychia of finger of right hand  Xerosis of skin    New Prescriptions Discharge Medication List as of 04/16/2016  5:16 PM    START taking these medications   Details  cephALEXin (KEFLEX) 500 MG capsule Take 1 capsule (500 mg total) by mouth 3 (three) times daily., Starting Wed 04/16/2016, Print      I personally performed the services described in this documentation, which was scribed in my presence. The recorded information has been reviewed and is accurate.     Rise Mu, PA-C 04/16/16 2345    Doug Sou, MD 04/17/16 8119

## 2016-04-16 NOTE — Discharge Instructions (Signed)
I have drained your abscess on your finger. Please use warm soaks with mild soap at home. He may also use warm compresses to help with the pain. Continue to take Tylenol and Motrin for pain. I have given you a prescription for antibiotics to take 3 times a day for the next 5 days. Please follow-up with the primary care doctor for wound recheck. Return to the ED if he develops worsening symptoms including worsening redness, worsening pain, fevers or for any other reason. Please use lotion such as Cetaphil or Jurgens on your hands and feet to help with the dry skin.

## 2016-04-16 NOTE — ED Notes (Signed)
Patient was alert, oriented and stable upon discharge. RN went over AVS and patient had no further questions.  

## 2016-04-16 NOTE — ED Triage Notes (Signed)
Pt reports 1 week hx of pain and swelling in the nailbed of the 4th finger, l/hand. Cuticle swollen. No drainage noted Also reports 6 month hx of dry cracked skin on feet.

## 2016-06-25 DIAGNOSIS — L409 Psoriasis, unspecified: Secondary | ICD-10-CM | POA: Diagnosis not present

## 2016-08-16 DIAGNOSIS — H5203 Hypermetropia, bilateral: Secondary | ICD-10-CM | POA: Diagnosis not present

## 2016-09-17 ENCOUNTER — Encounter: Payer: Self-pay | Admitting: Podiatry

## 2016-09-17 ENCOUNTER — Ambulatory Visit: Payer: BLUE CROSS/BLUE SHIELD

## 2016-09-17 ENCOUNTER — Ambulatory Visit (INDEPENDENT_AMBULATORY_CARE_PROVIDER_SITE_OTHER): Payer: BLUE CROSS/BLUE SHIELD | Admitting: Podiatry

## 2016-09-17 VITALS — Resp 16 | Ht 71.0 in | Wt 155.0 lb

## 2016-09-17 DIAGNOSIS — M79672 Pain in left foot: Principal | ICD-10-CM

## 2016-09-17 DIAGNOSIS — B079 Viral wart, unspecified: Secondary | ICD-10-CM | POA: Diagnosis not present

## 2016-09-17 DIAGNOSIS — B351 Tinea unguium: Secondary | ICD-10-CM

## 2016-09-17 DIAGNOSIS — M79671 Pain in right foot: Secondary | ICD-10-CM

## 2016-09-17 LAB — HEPATIC FUNCTION PANEL
ALBUMIN: 3.5 g/dL — AB (ref 3.6–5.1)
ALT: 23 U/L (ref 9–46)
AST: 21 U/L (ref 10–35)
Alkaline Phosphatase: 47 U/L (ref 40–115)
BILIRUBIN INDIRECT: 0.9 mg/dL (ref 0.2–1.2)
Bilirubin, Direct: 0.2 mg/dL (ref <=0.2)
TOTAL PROTEIN: 5.3 g/dL — AB (ref 6.1–8.1)
Total Bilirubin: 1.1 mg/dL (ref 0.2–1.2)

## 2016-09-17 MED ORDER — TERBINAFINE HCL 250 MG PO TABS: 250.0000 mg | ORAL_TABLET | Freq: Every day | ORAL | 0 refills | Status: DC

## 2016-09-17 NOTE — Progress Notes (Signed)
   Subjective:    Patient ID: [name], male    DOB: 1955/12/26, 61 y.o.   MRN: [id number]  HPI Chief Complaint  Patient presents with  . Painful lesion    Left foot; 3rd toe-top of toe; pt stated, "Thinks work shoes caused lesion to appear"; x3 months  . Nail Problem    Bilateral; all nails; nail discoloration & thickened nails; pt stated, "needs all nails checked for nail fungus"; x2 yrs      Review of Systems  Musculoskeletal: Positive for arthralgias and back pain.  Skin: Positive for rash.  All other systems reviewed and are negative.      Objective:   Physical Exam        Assessment & Plan:

## 2016-09-19 ENCOUNTER — Telehealth: Payer: Self-pay | Admitting: *Deleted

## 2016-09-19 MED ORDER — TRAMADOL HCL 50 MG PO TABS: 50.0000 mg | ORAL_TABLET | Freq: Three times a day (TID) | ORAL | 0 refills | Status: DC | PRN

## 2016-09-19 NOTE — Telephone Encounter (Signed)
Pt states Dr. Charlsie Merlesegal trimmed something off the bottom of his foot and his foot is killing him. Pt states the shoe he is wearing is painful and he has been doing soaks and covering with a bandaid, the area looks inflamed without a blister. Pt requested a pain medication. I told pt to perform epsom salt soaks 2x daily for comfort and to decrease the inflammation and to cover the area with neosporin with lidocaine bandaid after each soak, take OTC Ibuprofen as package directs if tolerates and I would ask Dr. Ardelle AntonWagoner for a light pain medication. Dr. Ardelle AntonWagoner ordered Tramadol 50mt #15 one tablet every 8 hours prn pain, do not take and work, drive or operate machinery. I informed pt of Dr. Gabriel RungWagoner's orders and also gave 4 offloading pads and gave verbal instructions of use. Pt states understanding.

## 2016-09-22 NOTE — Progress Notes (Signed)
Subjective:    Patient ID: [name], male   DOB: 61 y.o.   MRN: [id number]   HPI patient states that the lesion started on the third digit left foot of several months duration and it's very tender and feels like it's a pin. Also has nail discoloration bilateral and is had this for 2 years    Review of Systems  All other systems reviewed and are negative.       Objective:  Physical Exam  Constitutional: He is oriented to person, place, and time.  Cardiovascular: Intact distal pulses.   Musculoskeletal: Normal range of motion.  Neurological: He is alert and oriented to person, place, and time.  Skin: Skin is warm and dry.  Nursing note and vitals reviewed.  neurovascular status intact muscle strength adequate range of motion within normal limits with patient noted to have a distal lesion third digit left that's painful when palpated and thick yellow brittle nailbeds 1-5 both feet that are difficult for him to wear shoe gear with at times and also seems to get him skin irritation. Found have good digital perfusion well oriented 3     Assessment:    Distal keratotic lesion digit 3 left that upon debridement show pinpoint bleeding consistent with probable verruca versus porokeratosis and also mycotic nail infection 1-5 both feet     Plan:    H&P condition reviewed debrided lesion and applied chemical agent consisting of immune agent to create response. Then applied sterile dressing and I am getting get hepatic function and go ahead and start him on oral antifungal therapy to benefit and I did explain the utilization of this and risk

## 2016-09-24 ENCOUNTER — Other Ambulatory Visit: Payer: Self-pay | Admitting: Podiatry

## 2016-09-27 ENCOUNTER — Other Ambulatory Visit: Payer: Self-pay | Admitting: Podiatry

## 2016-09-30 ENCOUNTER — Telehealth: Payer: Self-pay | Admitting: *Deleted

## 2016-09-30 NOTE — Telephone Encounter (Signed)
Unable to leave a message for pt to make an appt if continuing to have pain, [employer] number has been changed. Unable to contact pt on work phone the number has been disconnected.

## 2016-10-15 ENCOUNTER — Ambulatory Visit: Payer: BLUE CROSS/BLUE SHIELD | Admitting: Podiatry

## 2017-02-09 DIAGNOSIS — B079 Viral wart, unspecified: Secondary | ICD-10-CM | POA: Diagnosis not present

## 2017-02-09 DIAGNOSIS — B352 Tinea manuum: Secondary | ICD-10-CM | POA: Diagnosis not present

## 2017-03-04 DIAGNOSIS — G5790 Unspecified mononeuropathy of unspecified lower limb: Secondary | ICD-10-CM | POA: Diagnosis not present

## 2017-03-04 DIAGNOSIS — M2042 Other hammer toe(s) (acquired), left foot: Secondary | ICD-10-CM | POA: Diagnosis not present

## 2017-03-04 DIAGNOSIS — M2041 Other hammer toe(s) (acquired), right foot: Secondary | ICD-10-CM | POA: Diagnosis not present

## 2017-03-18 DIAGNOSIS — R202 Paresthesia of skin: Secondary | ICD-10-CM | POA: Diagnosis not present

## 2017-03-18 DIAGNOSIS — R51 Headache: Secondary | ICD-10-CM | POA: Diagnosis not present

## 2017-03-18 DIAGNOSIS — R531 Weakness: Secondary | ICD-10-CM | POA: Diagnosis not present

## 2017-03-18 DIAGNOSIS — M5412 Radiculopathy, cervical region: Secondary | ICD-10-CM | POA: Diagnosis not present

## 2017-03-18 DIAGNOSIS — M5417 Radiculopathy, lumbosacral region: Secondary | ICD-10-CM | POA: Diagnosis not present

## 2017-03-18 DIAGNOSIS — G5603 Carpal tunnel syndrome, bilateral upper limbs: Secondary | ICD-10-CM | POA: Diagnosis not present

## 2017-04-15 DIAGNOSIS — R202 Paresthesia of skin: Secondary | ICD-10-CM | POA: Diagnosis not present

## 2017-04-15 DIAGNOSIS — G5603 Carpal tunnel syndrome, bilateral upper limbs: Secondary | ICD-10-CM | POA: Diagnosis not present

## 2017-04-15 DIAGNOSIS — R51 Headache: Secondary | ICD-10-CM | POA: Diagnosis not present

## 2017-04-15 DIAGNOSIS — M5417 Radiculopathy, lumbosacral region: Secondary | ICD-10-CM | POA: Diagnosis not present

## 2017-04-15 DIAGNOSIS — M542 Cervicalgia: Secondary | ICD-10-CM | POA: Diagnosis not present

## 2017-05-01 DIAGNOSIS — M71572 Other bursitis, not elsewhere classified, left ankle and foot: Secondary | ICD-10-CM | POA: Diagnosis not present

## 2017-05-01 DIAGNOSIS — M2042 Other hammer toe(s) (acquired), left foot: Secondary | ICD-10-CM | POA: Diagnosis not present

## 2017-05-13 DIAGNOSIS — R200 Anesthesia of skin: Secondary | ICD-10-CM | POA: Diagnosis not present

## 2017-05-13 DIAGNOSIS — R270 Ataxia, unspecified: Secondary | ICD-10-CM | POA: Diagnosis not present

## 2017-05-13 DIAGNOSIS — M5417 Radiculopathy, lumbosacral region: Secondary | ICD-10-CM | POA: Diagnosis not present

## 2017-05-13 DIAGNOSIS — R531 Weakness: Secondary | ICD-10-CM | POA: Diagnosis not present

## 2017-05-13 DIAGNOSIS — M542 Cervicalgia: Secondary | ICD-10-CM | POA: Diagnosis not present

## 2017-05-27 DIAGNOSIS — M2042 Other hammer toe(s) (acquired), left foot: Secondary | ICD-10-CM | POA: Diagnosis not present

## 2017-05-27 DIAGNOSIS — M71572 Other bursitis, not elsewhere classified, left ankle and foot: Secondary | ICD-10-CM | POA: Diagnosis not present

## 2017-06-21 ENCOUNTER — Other Ambulatory Visit: Payer: Self-pay

## 2017-06-21 ENCOUNTER — Emergency Department (HOSPITAL_COMMUNITY)
Admission: EM | Admit: 2017-06-21 | Discharge: 2017-06-21 | Discharge status: Against Medical Advice | Payer: Self-pay | Attending: Emergency Medicine | Admitting: Emergency Medicine

## 2017-06-21 ENCOUNTER — Encounter (HOSPITAL_COMMUNITY): Payer: Self-pay

## 2017-06-21 DIAGNOSIS — Z5321 Procedure and treatment not carried out due to patient leaving prior to being seen by health care provider: Secondary | ICD-10-CM | POA: Insufficient documentation

## 2017-06-21 NOTE — ED Notes (Signed)
Did not answer when called 

## 2017-06-21 NOTE — ED Triage Notes (Signed)
We have called him five times; and area unable to locate him.

## 2017-06-21 NOTE — ED Triage Notes (Signed)
He c/o feeling a "bulge" with some discomfort at right inguinal area while at work [employer]. (three days ago). He also mentions left shoulder area pain, which is chronic and which has been dx as caused by cervical radiculopathy for which he has received "injections from a neurosurgeon". He ambulates without difficulty.

## 2017-07-08 DIAGNOSIS — M2042 Other hammer toe(s) (acquired), left foot: Secondary | ICD-10-CM | POA: Diagnosis not present

## 2017-07-10 ENCOUNTER — Encounter (HOSPITAL_COMMUNITY): Payer: Self-pay | Admitting: Emergency Medicine

## 2017-07-10 ENCOUNTER — Other Ambulatory Visit: Payer: Self-pay

## 2017-07-10 ENCOUNTER — Ambulatory Visit (HOSPITAL_COMMUNITY)
Admission: EM | Admit: 2017-07-10 | Discharge: 2017-07-10 | Disposition: A | Discharge status: Home / Self Care | Payer: BLUE CROSS/BLUE SHIELD | Attending: Emergency Medicine | Admitting: Emergency Medicine

## 2017-07-10 DIAGNOSIS — K4090 Unilateral inguinal hernia, without obstruction or gangrene, not specified as recurrent: Secondary | ICD-10-CM | POA: Diagnosis not present

## 2017-07-10 MED ORDER — IBUPROFEN 600 MG PO TABS: 600.0000 mg | ORAL_TABLET | Freq: Four times a day (QID) | ORAL | 0 refills | Status: DC | PRN

## 2017-07-10 NOTE — ED Provider Notes (Signed)
HPI  SUBJECTIVE:  [name] is a 62 y.o. male who presents with a possible right-sided inguinal hernia that is been present since 214.  States it started after doing some lifting at work.  He reports increased pain, swelling in the right inguinal region recently.  He describes the pain as soreness, states that occasionally shoots to the left flank.  States it became constant yesterday.  He is not sure if the hernia has changed in size.  Denies nausea, vomiting, fevers, testicular pain or swelling, back pain, urinary complaints.  He tried Advil 800 mg without improvement of symptoms.  Symptoms are worse with sitting down.  He is not sure if it is associated with Valsalva, lifting, movement.  He took ibuprofen within 6-8 hours of evaluation.  Past medical history of bilateral hernia repair as a child, diabetes, hypertension, other abdominal surgeries.  PMD: None.  He tried filing this under [employer]. initially, and has been evaluated by [facility] occupational health, who wrote him for light duty.    History reviewed. No pertinent past medical history.  Past Surgical History:  Procedure Laterality Date  . CERVICAL SPINE SURGERY    . CHEST TUBE INSERTION     pnx tx  . MANDIBLE FRACTURE SURGERY      Family History  Problem Relation Age of Onset  . Cancer Mother     Social History   Tobacco Use  . Smoking status: Passive Smoke Exposure - Never Smoker  . Smokeless tobacco: Never Used  Substance Use Topics  . Alcohol use: No  . Drug use: No    No current facility-administered medications for this encounter.   Current Outpatient Medications:  .  ibuprofen (ADVIL,MOTRIN) 600 MG tablet, Take 1 tablet (600 mg total) by mouth every 6 (six) hours as needed., Disp: 30 tablet, Rfl: 0  No Known Allergies   ROS  As noted in HPI.   Physical Exam  BP 136/76 (BP Location: Left Arm)   Pulse 78   Temp 98.3 F (36.8 C) (Oral)   Resp 18   SpO2 97%   Constitutional: Well developed,  well nourished, no acute distress Eyes:  EOMI, conjunctiva normal bilaterally HENT: Normocephalic, atraumatic,mucus membranes moist Respiratory: Normal inspiratory effort Cardiovascular: Normal rate GI: Normal appearance, soft, nontender, nondistended.  Negative Murphy, negative McBurney.  Active bowel sounds.  No guarding, rebound. GU: Positive mildly tender right inguinal reducible hernia.  No bowel sounds heard over the hernia.  No guarding, rebound.  Normal circumcised male, testes descended bilaterally.  No testicular swelling, tenderness.  No scrotal swelling, tenderness.  Patient declined chaperone skin: No rash, skin intact Musculoskeletal: no deformities Neurologic: Alert & oriented x 3, no focal neuro deficits Psychiatric: Speech and behavior appropriate   ED Course   Medications - No data to display  Orders Placed This Encounter  Procedures  . Ambulatory referral to General Surgery    Referral Priority:   Urgent    Referral Type:   Surgical    Referral Reason:   Specialty Services Required    Requested Specialty:   General Surgery    Number of Visits Requested:   1    No results found for this or any previous visit (from the past 24 hour(s)). No results found.  ED Clinical Impression  Inguinal hernia, right   ED Assessment/Plan  Patient with a reducible right inguinal hernia.  No evidence of incarceration or strangulation at this point in time.  Discussed with him that this does  need to be repaired as soon as possible.  [facility] and also give him Dr. Angelena Formhris White information, general surgeon on call.  Follow-up with them ASAP, to the ER if he gets worse.  Discussed  MDM, plan and followup with patient. Discussed sn/sx that should prompt return to the ED. patient agrees with plan.   Meds ordered this encounter  Medications  . ibuprofen (ADVIL,MOTRIN) 600 MG tablet    Sig: Take 1 tablet (600 mg total) by mouth every 6 (six) hours as  needed.    Dispense:  30 tablet    Refill:  0    *This clinic note was created using Scientist, clinical (histocompatibility and immunogenetics)Dragon dictation software. Therefore, there may be occasional mistakes despite careful proofreading.   ?   Domenick GongMortenson, Kyaira Trantham, MD 07/11/17 1510

## 2017-07-10 NOTE — Discharge Instructions (Signed)
600 mg of Tylenol with 1 g of Tylenol 3-4 times a day as needed.  Minimize heavy lifting.  Stool softeners if you are constipated.  Try not to strain while having a bowel movement.  Follow-up with Dr. Angelena Formhris White or [place] [facility] as soon as you possibly can.

## 2017-07-10 NOTE — ED Triage Notes (Signed)
[facility] with a complaint of a possible hernia that occurred at work. The patient stated that workers compensation has denied the claim.

## 2017-07-27 DIAGNOSIS — K4091 Unilateral inguinal hernia, without obstruction or gangrene, recurrent: Secondary | ICD-10-CM | POA: Diagnosis not present

## 2017-08-04 ENCOUNTER — Ambulatory Visit: Payer: Self-pay | Admitting: Surgery

## 2017-08-04 DIAGNOSIS — N401 Enlarged prostate with lower urinary tract symptoms: Secondary | ICD-10-CM | POA: Diagnosis not present

## 2017-08-04 DIAGNOSIS — K4091 Unilateral inguinal hernia, without obstruction or gangrene, recurrent: Secondary | ICD-10-CM | POA: Diagnosis not present

## 2017-08-04 DIAGNOSIS — Z01818 Encounter for other preprocedural examination: Secondary | ICD-10-CM | POA: Diagnosis not present

## 2017-08-04 DIAGNOSIS — R3911 Hesitancy of micturition: Secondary | ICD-10-CM | POA: Diagnosis not present

## 2017-08-04 NOTE — H&P (Signed)
[name] Documented: 08/04/2017 8:28 AM [facility] Patient #: [id number] DOB: 07/26/1955 Divorced / Language: [name] / Race: White Male  History of Present Illness Bryan Sportsman MD; 08/04/2017 8:52 AM) The patient is a 62 year old male who presents with an inguinal hernia. Note for "Inguinal hernia": ` ` ` Patient sent for surgical consultation at the request of Dr. Angelena Form  Chief Complaint: Recurrent inguinal hernia  The patient is a pleasant active gentleman who had open inguinal hernia repairs in 1980s. He thinks he may be used mesh. Was done mainly under local anesthetic. He notify us to some lifting at work he had some increased pain and swelling especially in his right groin. Concerned him. He went to the emergency room last month. Diagnosed with an inguinal hernia. Sent for surgical consultation. Somma and her partner. Confirmed a recurrent right inguinal hernia. He was referred to me given my expertise in laparoscopic approach is to hernias and recurrent inguinal hernias.  He does moderate intense lifting. Still has some pains there. Ibuprofen seems to help control. He is occasionally had some pains in the left groin since the last office visit. Moves his bowels every day. Can walk several hours without difficulty. Had a laparoscopic appendectomy by Dr. Cliffton May in our group in 2007 without incident. No nausea or vomiting. No history of skin infections. He does not smoke. He is not diabetic. No cardiac issues.  (Review of systems as stated in this history (HPI) or in the review of systems. Otherwise all other 12 point ROS are negative) ` ` `   Past Surgical History Bryan Sportsman, MD; 08/04/2017 8:51 AM) Appendectomy [2007]: lap appy - Dr [name]  Medication History [name], CMA; 08/04/2017 8:30 AM) Ibuprofen (600MG  Tablet, Oral) Active. No Current Medications (Taken starting 08/04/2017) Medications  Reconciled    Vitals ([name] CMA; 08/04/2017 8:29 AM) 08/04/2017 8:28 AM Weight: 152 lb Height: 71in Body Surface Area: 1.88 m Body Mass Index: 21.2 kg/m  Pulse: 84 (Regular)  BP: 128/74 (Sitting, Left Arm, Standard)      Physical Exam Bryan Sportsman MD; 08/04/2017 8:51 AM)  General Mental Status-Alert. General Appearance-Not in acute distress, Not Sickly. Orientation-Oriented X3. Hydration-Well hydrated. Voice-Normal.  Integumentary Global Assessment Upon inspection and palpation of skin surfaces of the - Axillae: non-tender, no inflammation or ulceration, no drainage. and Distribution of scalp and body hair is normal. General Characteristics Temperature - normal warmth is noted.  Head and Neck Head-normocephalic, atraumatic with no lesions or palpable masses. Face Global Assessment - atraumatic, no absence of expression. Neck Global Assessment - no abnormal movements, no bruit auscultated on the right, no bruit auscultated on the left, no decreased range of motion, non-tender. Trachea-midline. Thyroid Gland Characteristics - non-tender.  Eye Eyeball - Left-Extraocular movements intact, No Nystagmus. Eyeball - Right-Extraocular movements intact, No Nystagmus. Cornea - Left-No Hazy. Cornea - Right-No Hazy. Sclera/Conjunctiva - Left-No scleral icterus, No Discharge. Sclera/Conjunctiva - Right-No scleral icterus, No Discharge. Pupil - Left-Direct reaction to light normal. Pupil - Right-Direct reaction to light normal.  ENMT Ears Pinna - Left - no drainage observed, no generalized tenderness observed. Right - no drainage observed, no generalized tenderness observed. Nose and Sinuses External Inspection of the Nose - no destructive lesion observed. Inspection of the nares - Left - quiet respiration. Right - quiet respiration. Mouth and Throat Lips - Upper Lip - no fissures observed, no pallor noted. Lower Lip - no  fissures observed, no pallor  noted. Nasopharynx - no discharge present. Oral Cavity/Oropharynx - Tongue - no dryness observed. Oral Mucosa - no cyanosis observed. Hypopharynx - no evidence of airway distress observed.  Chest and Lung Exam Inspection Movements - Normal and Symmetrical. Accessory muscles - No use of accessory muscles in breathing. Palpation Palpation of the chest reveals - Non-tender. Auscultation Breath sounds - Normal and Clear.  Cardiovascular Auscultation Rhythm - Regular. Murmurs & Other Heart Sounds - Auscultation of the heart reveals - No Murmurs and No Systolic Clicks.  Abdomen Inspection Inspection of the abdomen reveals - No Visible peristalsis and No Abnormal pulsations. Umbilicus - No Bleeding, No Urine drainage. Palpation/Percussion Palpation and Percussion of the abdomen reveal - Soft, Non Tender, No Rebound tenderness, No Rigidity (guarding) and No Cutaneous hyperesthesia. Note: Abdomen soft. Nontender. Not distended. No umbilical or incisional hernias. No diastases. No guarding.  Male Genitourinary Sexual Maturity Tanner 5 - Adult hair pattern and Adult penile size and shape. Note: ` ` ` Obvious right groin bulge with a sensitive but reducible inguinal hernia to the groin. Left side sensitive at the external ring and mild bulging but no definite hernia there. Otherwise circumcised male with normal external male genitalia. Epididymides and testicles without any abnormalities or masses.  Peripheral Vascular Upper Extremity Inspection - Left - No Cyanotic nailbeds, Not Ischemic. Right - No Cyanotic nailbeds, Not Ischemic.  Neurologic Neurologic evaluation reveals -normal attention span and ability to concentrate, able to name objects and repeat phrases. Appropriate fund of knowledge , normal sensation and normal coordination. Mental Status Affect - not angry, not paranoid. Cranial Nerves-Normal  Bilaterally. Gait-Normal.  Neuropsychiatric Mental status exam performed with findings of-able to articulate well with normal speech/language, rate, volume and coherence, thought content normal with ability to perform basic computations and apply abstract reasoning and no evidence of hallucinations, delusions, obsessions or homicidal/suicidal ideation.  Musculoskeletal Global Assessment Spine, Ribs and Pelvis - no instability, subluxation or laxity. Right Upper Extremity - no instability, subluxation or laxity.  Lymphatic Head & Neck  General Head & Neck Lymphatics: Bilateral - Description - No Localized lymphadenopathy. Axillary  General Axillary Region: Bilateral - Description - No Localized lymphadenopathy. Femoral & Inguinal  Generalized Femoral & Inguinal Lymphatics: Left - Description - No Localized lymphadenopathy. Right - Description - No Localized lymphadenopathy.    Assessment & Plan Bryan Sportsman MD; 08/04/2017 8:54 AM)  RECURRENT INGUINAL HERNIA (K40.91) Impression: Pleasant active male with obvious right inguinal hernia. Recurrent from prior open repair in the 1980s. Some pain & sensitivity in the left side but much milder and no definite hernia there.  I think he would benefit from diagnostic laparoscopy and repair of any hernias found.  He is interested in proceeding. He would like to try and do it in early May from a work in [employer]. There are no warning signs of him not being able to wait a few extra weeks.   PREOP - ING HERNIA - ENCOUNTER FOR PREOPERATIVE EXAMINATION FOR GENERAL SURGICAL PROCEDURE (Z01.818)  Current Plans You are being scheduled for surgery- Our schedulers will call you.  You should hear from our office's scheduling department within 5 working days about the location, date, and time of surgery. We try to make accommodations for patient's preferences in scheduling surgery, but sometimes the OR schedule or the surgeon's  schedule prevents [place] from making those accommodations.  If you have not heard from our office [phone]) in 5 working days, call the office and ask for your surgeon's  nurse.  If you have other questions about your diagnosis, plan, or surgery, call the office and ask for your surgeon's nurse.  Written instructions provided The anatomy & physiology of the abdominal wall and pelvic floor was discussed. The pathophysiology of hernias in the inguinal and pelvic region was discussed. Natural history risks such as progressive enlargement, pain, incarceration, and strangulation was discussed. Contributors to complications such as smoking, obesity, diabetes, prior surgery, etc were discussed.  I feel the risks of no intervention will lead to serious problems that outweigh the operative risks; therefore, I recommended surgery to reduce and repair the hernia. I explained laparoscopic techniques with possible need for an open approach. I noted usual use of mesh to patch and/or buttress hernia repair  Risks such as bleeding, infection, abscess, need for further treatment, heart attack, death, and other risks were discussed. I noted a good likelihood this will help address the problem. Goals of post-operative recovery were discussed as well. Possibility that this will not correct all symptoms was explained. I stressed the importance of low-impact activity, aggressive pain control, avoiding constipation, & not pushing through pain to minimize risk of post-operative chronic pain or injury. Possibility of reherniation was discussed. We will work to minimize complications.  An educational handout further explaining the pathology & treatment options was given as well. Questions were answered. The patient expresses understanding & wishes to proceed with surgery.  Pt Education - Pamphlet Given - Laparoscopic Hernia Repair: discussed with patient and provided information. Pt Education - CCS Pain  Control (Zyara Riling) Pt Education - CCS Hernia Post-Op HCI (Pyper Olexa): discussed with patient and provided information. Pt Education - CCS Mesh education: discussed with patient and provided information.  URINARY HESITANCY DUE TO BENIGN PROSTATIC HYPERPLASIA (N40.1) Impression: Sounds like he does have some weaker stream and hesitancy and frequency. Not too severe.  Testing case, we'll place him on tamsulosin perioperatively to minimize the risk of urinary retention and other issues especially given the fact this is a recurrent hernia.  Current Plans Started Tamsulosin HCl 0.4 MG Oral Capsule, 1 (one) Capsule daily, #14, 14 days starting 08/04/2017, Ref. x2. Local Order: to shrink the prostate and help with urination. Start one week before surgery to minimize needing a catheter after surgery  Bryan SportsmanSteven C. Jalyne Brodzinski, M.D., F.A.C.S. Gastrointestinal and Minimally Invasive Surgery [facility] [address], [address] [place], [place] [id number] [phone] Main / Paging

## 2017-09-09 HISTORY — PX: HERNIA REPAIR: SHX51

## 2017-09-09 HISTORY — PX: INGUINAL HERNIA REPAIR: SUR1180

## 2017-09-10 DIAGNOSIS — D176 Benign lipomatous neoplasm of spermatic cord: Secondary | ICD-10-CM | POA: Diagnosis not present

## 2017-09-10 DIAGNOSIS — K4021 Bilateral inguinal hernia, without obstruction or gangrene, recurrent: Secondary | ICD-10-CM | POA: Diagnosis not present

## 2017-12-24 DIAGNOSIS — M545 Low back pain: Secondary | ICD-10-CM | POA: Diagnosis not present

## 2017-12-24 DIAGNOSIS — M9903 Segmental and somatic dysfunction of lumbar region: Secondary | ICD-10-CM | POA: Diagnosis not present

## 2017-12-24 DIAGNOSIS — M9904 Segmental and somatic dysfunction of sacral region: Secondary | ICD-10-CM | POA: Diagnosis not present

## 2017-12-24 DIAGNOSIS — M79662 Pain in left lower leg: Secondary | ICD-10-CM | POA: Diagnosis not present

## 2017-12-28 DIAGNOSIS — M545 Low back pain: Secondary | ICD-10-CM | POA: Diagnosis not present

## 2017-12-28 DIAGNOSIS — M79662 Pain in left lower leg: Secondary | ICD-10-CM | POA: Diagnosis not present

## 2017-12-28 DIAGNOSIS — M9904 Segmental and somatic dysfunction of sacral region: Secondary | ICD-10-CM | POA: Diagnosis not present

## 2017-12-28 DIAGNOSIS — M9903 Segmental and somatic dysfunction of lumbar region: Secondary | ICD-10-CM | POA: Diagnosis not present

## 2017-12-29 DIAGNOSIS — M9903 Segmental and somatic dysfunction of lumbar region: Secondary | ICD-10-CM | POA: Diagnosis not present

## 2017-12-29 DIAGNOSIS — M545 Low back pain: Secondary | ICD-10-CM | POA: Diagnosis not present

## 2017-12-29 DIAGNOSIS — M9904 Segmental and somatic dysfunction of sacral region: Secondary | ICD-10-CM | POA: Diagnosis not present

## 2017-12-29 DIAGNOSIS — M79662 Pain in left lower leg: Secondary | ICD-10-CM | POA: Diagnosis not present

## 2018-01-06 DIAGNOSIS — M9904 Segmental and somatic dysfunction of sacral region: Secondary | ICD-10-CM | POA: Diagnosis not present

## 2018-01-06 DIAGNOSIS — M9903 Segmental and somatic dysfunction of lumbar region: Secondary | ICD-10-CM | POA: Diagnosis not present

## 2018-01-06 DIAGNOSIS — M545 Low back pain: Secondary | ICD-10-CM | POA: Diagnosis not present

## 2018-01-06 DIAGNOSIS — M79662 Pain in left lower leg: Secondary | ICD-10-CM | POA: Diagnosis not present

## 2018-01-08 DIAGNOSIS — M545 Low back pain: Secondary | ICD-10-CM | POA: Diagnosis not present

## 2018-01-08 DIAGNOSIS — M9903 Segmental and somatic dysfunction of lumbar region: Secondary | ICD-10-CM | POA: Diagnosis not present

## 2018-01-08 DIAGNOSIS — M79662 Pain in left lower leg: Secondary | ICD-10-CM | POA: Diagnosis not present

## 2018-01-08 DIAGNOSIS — M9904 Segmental and somatic dysfunction of sacral region: Secondary | ICD-10-CM | POA: Diagnosis not present

## 2018-04-26 ENCOUNTER — Encounter (INDEPENDENT_AMBULATORY_CARE_PROVIDER_SITE_OTHER): Payer: Self-pay

## 2018-04-26 ENCOUNTER — Encounter: Payer: Self-pay | Admitting: Nurse Practitioner

## 2018-04-26 ENCOUNTER — Ambulatory Visit (INDEPENDENT_AMBULATORY_CARE_PROVIDER_SITE_OTHER): Payer: BLUE CROSS/BLUE SHIELD | Admitting: Nurse Practitioner

## 2018-04-26 VITALS — BP 140/80 | HR 63 | Ht 71.0 in | Wt 159.0 lb

## 2018-04-26 DIAGNOSIS — Z1211 Encounter for screening for malignant neoplasm of colon: Secondary | ICD-10-CM

## 2018-04-26 DIAGNOSIS — Z13220 Encounter for screening for lipoid disorders: Secondary | ICD-10-CM

## 2018-04-26 DIAGNOSIS — Z9189 Other specified personal risk factors, not elsewhere classified: Secondary | ICD-10-CM

## 2018-04-26 DIAGNOSIS — R030 Elevated blood-pressure reading, without diagnosis of hypertension: Secondary | ICD-10-CM | POA: Diagnosis not present

## 2018-04-26 DIAGNOSIS — R21 Rash and other nonspecific skin eruption: Secondary | ICD-10-CM | POA: Diagnosis not present

## 2018-04-26 DIAGNOSIS — Z125 Encounter for screening for malignant neoplasm of prostate: Secondary | ICD-10-CM

## 2018-04-26 DIAGNOSIS — Z0000 Encounter for general adult medical examination without abnormal findings: Secondary | ICD-10-CM | POA: Insufficient documentation

## 2018-04-26 DIAGNOSIS — M5442 Lumbago with sciatica, left side: Secondary | ICD-10-CM | POA: Diagnosis not present

## 2018-04-26 DIAGNOSIS — Z1159 Encounter for screening for other viral diseases: Secondary | ICD-10-CM

## 2018-04-26 DIAGNOSIS — G8929 Other chronic pain: Secondary | ICD-10-CM

## 2018-04-26 MED ORDER — MELOXICAM 7.5 MG PO TABS: 7.5000 mg | ORAL_TABLET | Freq: Every day | ORAL | 0 refills | Status: DC

## 2018-04-26 NOTE — Progress Notes (Signed)
[name] is a 62 y.o. male with the following history as recorded in EpicCare:  Patient Active Problem List   Diagnosis Date Noted  . Chronic midline low back pain with left-sided sciatica 04/26/2018  . Healthcare maintenance 04/26/2018    Current Outpatient Medications  Medication Sig Dispense Refill  . ibuprofen (ADVIL,MOTRIN) 600 MG tablet Take 1 tablet (600 mg total) by mouth every 6 (six) hours as needed. 30 tablet 0  . meloxicam (MOBIC) 7.5 MG tablet Take 1 tablet (7.5 mg total) by mouth daily. 30 tablet 0   No current facility-administered medications for this visit.     Allergies: Patient has no known allergies.  Past Medical History:  Diagnosis Date  . Low back pain     Past Surgical History:  Procedure Laterality Date  . APPENDECTOMY    . CERVICAL SPINE SURGERY    . CHEST TUBE INSERTION     pnx tx  . HERNIA REPAIR  09/09/2017   inguinal  . MANDIBLE FRACTURE SURGERY      Family History  Problem Relation Age of Onset  . Cancer Mother   . Breast cancer Sister   . Early death Paternal Grandfather     Social History   Tobacco Use  . Smoking status: Former Games developermoker  . Smokeless tobacco: Never Used  Substance Use Topics  . Alcohol use: No     Subjective:  [name] is here today to establish care as a new patient to our practice, no prior PCP. Aside from primary care, he is not routinely followed by any specialists and He is not currently maintained on any daily medications. He says he wants to get routine labs checked today, he would also like to discuss back pain and a rash. We will discuss his elevated BP reading as well.   Back pain- This is not a new problem, he reports chronic lower back pain for years, saw neurosurgery years ago with spinal injection which seemed to help for about 3 years, but pain has increased again over past year. He describes as aching, stiffness in lower back, occurs daily, worse in am when waking and worse with movement. Sometimes  the pain radiates down left leg. He has been going to chiropractor for back pain this year, but not really sure its helping, he says the chiropractor did xray of his lower back last month and was told the xray was not remarkable. He denies fevers, chills, bowel or bladder changes, falls . He has been taking ibuprofen daily which helps some.  Rash- this is not a new problem, he reports dry scaly skin to palms of hands and anterior legs, ongoing for some time now, better with lotion, worse with dry cold weather. He was seen by a dermatologist in the past and "given a bunch of creams' but they did not help. He would like to have a referral to another dermatologist for second opinion.  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   Objective:  Vitals:   04/26/18 0850 04/26/18 0955  BP: (!) 148/76 140/80  Pulse: 63   SpO2: 95%   Weight: 159 lb (72.1 kg)   Height: 5\' 11"  (1.803 m)     General: Well developed, well nourished, in no acute  distress  Skin : Warm and dry. Dry scaly skin to palms of hands, anterior lower extremities. Head: Normocephalic and atraumatic  Eyes: Sclera and conjunctiva clear; pupils round and reactive to light; extraocular movements intact  Oropharynx: Pink, supple. No suspicious lesions  Neck: Supple without thyromegaly Lungs: Respirations unlabored; clear to auscultation bilaterally without wheeze, rales, rhonchi  CVS exam: normal rate, regular rhythm, normal S1, S2, no murmurs, rubs, clicks or gallops.  Musculoskeletal: No deformities; no active joint inflammation     Lumbar back: He exhibits normal range of motion, no tenderness, no swelling and no deformity.  Extremities: No edema, cyanosis Vessels: Symmetric bilaterally   Neurologic: Alert and oriented; speech intact; face symmetrical; moves all extremities well; CNII-XII intact without focal deficit  Psychiatric: Normal mood and affect.  Assessment:  1. Chronic midline low back pain with left-sided sciatica   2. Elevated blood pressure reading   3. Rash   4. Screening for cholesterol level   5. Screening for prostate cancer   6. Screening for colon cancer   7. Encounter for hepatitis C virus screening test for high risk patient   8. Healthcare maintenance     Plan:   Elevated blood pressure reading Improved on second reading He will continue to monitor BP at least twice weekly and F/U for any elevated readings > 140/90 - CBC; Future - Comprehensive metabolic panel; Future - Lipid panel; Future - TSH; Future  Rash - Ambulatory referral to Dermatology   Return in about 6 months (around 10/26/2018) for routine follow up.  Orders Placed This Encounter  Procedures  . CBC    Standing Status:   Future    Standing Expiration Date:   04/27/2019  . Comprehensive metabolic panel    Standing Status:   Future    Standing Expiration Date:   04/27/2019  . Lipid panel    Standing Status:   Future    Standing Expiration Date:   04/27/2019  . PSA    Standing Status:   Future    Standing Expiration Date:   04/27/2019  . Hepatitis C antibody    Standing Status:   Future    Standing Expiration Date:   05/27/2018  . TSH    Standing Status:   Future    Standing Expiration Date:   04/27/2019  . Ambulatory referral to Dermatology    Referral Priority:   Routine    Referral Type:   Consultation    Referral Reason:   Specialty Services Required    Requested Specialty:   Dermatology    Number of Visits Requested:   1  . Ambulatory referral to Gastroenterology    Referral Priority:   Routine    Referral Type:   Consultation    Referral Reason:   Specialty Services Required    Number of Visits Requested:   1    Requested Prescriptions   Signed  Prescriptions Disp Refills  . meloxicam (MOBIC) 7.5 MG tablet 30 tablet 0    Sig: Take 1 tablet (7.5 mg total) by mouth daily.

## 2018-04-26 NOTE — Assessment & Plan Note (Signed)
Screening for cholesterol level He is fasting - Lipid panel; Future  Screening for prostate cancer - PSA; Future  Screening for colon cancer - Ambulatory referral to Gastroenterology  Encounter for hepatitis C virus screening test for high risk patient - Hepatitis C antibody; Future

## 2018-04-26 NOTE — Patient Instructions (Addendum)
Head downstairs for lab work  Smith InternationalStart mobic once daily for lower back pain. Do not take other NSAIDS- aleve, naproxen, ibuprofen with mobic Please schedule a follow up appointment for further evaluation here with Dr Katrinka BlazingSmith or Dr Jordan LikesSchmitz, our sports medicine providers  Please try to check your blood pressure once daily or at least a few times a week, at the same time each day, and keep a log. Please follow up for blood pressure readings over 140/90   Back Exercises If you have pain in your back, do these exercises 2-3 times each day or as told by your doctor. When the pain goes away, do the exercises once each day, but repeat the steps more times for each exercise (do more repetitions). If you do not have pain in your back, do these exercises once each day or as told by your doctor. Exercises Single Knee to Chest Do these steps 3-5 times in a row for each leg: 1. Lie on your back on a firm bed or the floor with your legs stretched out. 2. Bring one knee to your chest. 3. Hold your knee to your chest by grabbing your knee or thigh. 4. Pull on your knee until you feel a gentle stretch in your lower back. 5. Keep doing the stretch for 10-30 seconds. 6. Slowly let go of your leg and straighten it. Pelvic Tilt Do these steps 5-10 times in a row: 1. Lie on your back on a firm bed or the floor with your legs stretched out. 2. Bend your knees so they point up to the ceiling. Your feet should be flat on the floor. 3. Tighten your lower belly (abdomen) muscles to press your lower back against the floor. This will make your tailbone point up to the ceiling instead of pointing down to your feet or the floor. 4. Stay in this position for 5-10 seconds while you gently tighten your muscles and breathe evenly. Cat-Cow Do these steps until your lower back bends more easily: 1. Get on your hands and knees on a firm surface. Keep your hands under your shoulders, and keep your knees under your hips. You may put  padding under your knees. 2. Let your head hang down, and make your tailbone point down to the floor so your lower back is round like the back of a cat. 3. Stay in this position for 5 seconds. 4. Slowly lift your head and make your tailbone point up to the ceiling so your back hangs low (sags) like the back of a cow. 5. Stay in this position for 5 seconds.  Press-Ups Do these steps 5-10 times in a row: 1. Lie on your belly (face-down) on the floor. 2. Place your hands near your head, about shoulder-width apart. 3. While you keep your back relaxed and keep your hips on the floor, slowly straighten your arms to raise the top half of your body and lift your shoulders. Do not use your back muscles. To make yourself more comfortable, you may change where you place your hands. 4. Stay in this position for 5 seconds. 5. Slowly return to lying flat on the floor.  Bridges Do these steps 10 times in a row: 1. Lie on your back on a firm surface. 2. Bend your knees so they point up to the ceiling. Your feet should be flat on the floor. 3. Tighten your butt muscles and lift your butt off of the floor until your waist is almost as high as your  knees. If you do not feel the muscles working in your butt and the back of your thighs, slide your feet 1-2 inches farther away from your butt. 4. Stay in this position for 3-5 seconds. 5. Slowly lower your butt to the floor, and let your butt muscles relax. If this exercise is too easy, try doing it with your arms crossed over your chest. Belly Crunches Do these steps 5-10 times in a row: 1. Lie on your back on a firm bed or the floor with your legs stretched out. 2. Bend your knees so they point up to the ceiling. Your feet should be flat on the floor. 3. Cross your arms over your chest. 4. Tip your chin a little bit toward your chest but do not bend your neck. 5. Tighten your belly muscles and slowly raise your chest just enough to lift your shoulder blades a  tiny bit off of the floor. 6. Slowly lower your chest and your head to the floor. Back Lifts Do these steps 5-10 times in a row: 1. Lie on your belly (face-down) with your arms at your sides, and rest your forehead on the floor. 2. Tighten the muscles in your legs and your butt. 3. Slowly lift your chest off of the floor while you keep your hips on the floor. Keep the back of your head in line with the curve in your back. Look at the floor while you do this. 4. Stay in this position for 3-5 seconds. 5. Slowly lower your chest and your face to the floor. Contact a doctor if:  Your back pain gets a lot worse when you do an exercise.  Your back pain does not lessen 2 hours after you exercise. If you have any of these problems, stop doing the exercises. Do not do them again unless your doctor says it is okay. Get help right away if:  You have sudden, very bad back pain. If this happens, stop doing the exercises. Do not do them again unless your doctor says it is okay. This information is not intended to replace advice given to you by your health care provider. Make sure you discuss any questions you have with your health care provider. Document Released: 05/24/2010 Document Revised: 01/13/2018 Document Reviewed: 06/15/2014 Elsevier Interactive Patient Education  [employer]2019 [employer] Inc[employer]

## 2018-04-26 NOTE — Assessment & Plan Note (Signed)
mobic course given-dosing, side effects discussed, instructed not to combine with other NSAIDS Home management, daily exercises, red flags and return precautions including when to seek immediate care discussed and printed on AVS We discussed referral to sports medicine for further evaluation and he is agreeable, he will schedule with our sports med providers here in clinic - meloxicam (MOBIC) 7.5 MG tablet; Take 1 tablet (7.5 mg total) by mouth daily.  Dispense: 30 tablet; Refill: 0

## 2018-05-06 ENCOUNTER — Encounter: Payer: Self-pay | Admitting: Gastroenterology

## 2018-05-06 ENCOUNTER — Other Ambulatory Visit (INDEPENDENT_AMBULATORY_CARE_PROVIDER_SITE_OTHER): Payer: BLUE CROSS/BLUE SHIELD

## 2018-05-06 DIAGNOSIS — Z125 Encounter for screening for malignant neoplasm of prostate: Secondary | ICD-10-CM | POA: Diagnosis not present

## 2018-05-06 DIAGNOSIS — Z9189 Other specified personal risk factors, not elsewhere classified: Secondary | ICD-10-CM

## 2018-05-06 DIAGNOSIS — Z1159 Encounter for screening for other viral diseases: Secondary | ICD-10-CM

## 2018-05-06 DIAGNOSIS — R030 Elevated blood-pressure reading, without diagnosis of hypertension: Secondary | ICD-10-CM | POA: Diagnosis not present

## 2018-05-06 DIAGNOSIS — Z13220 Encounter for screening for lipoid disorders: Secondary | ICD-10-CM | POA: Diagnosis not present

## 2018-05-06 LAB — PSA: PSA: 2.29 ng/mL (ref 0.10–4.00)

## 2018-05-06 LAB — COMPREHENSIVE METABOLIC PANEL
ALT: 35 U/L (ref 0–53)
AST: 26 U/L (ref 0–37)
Albumin: 4 g/dL (ref 3.5–5.2)
Alkaline Phosphatase: 50 U/L (ref 39–117)
BUN: 16 mg/dL (ref 6–23)
CO2: 29 mEq/L (ref 19–32)
CREATININE: 0.8 mg/dL (ref 0.40–1.50)
Calcium: 8.5 mg/dL (ref 8.4–10.5)
Chloride: 104 mEq/L (ref 96–112)
GFR: 104.12 mL/min (ref >60.00)
GLUCOSE: 92 mg/dL (ref 70–99)
Potassium: 3.9 mEq/L (ref 3.5–5.1)
Sodium: 139 mEq/L (ref 135–145)
Total Bilirubin: 1.3 mg/dL — ABNORMAL HIGH (ref 0.2–1.2)
Total Protein: 5.7 g/dL — ABNORMAL LOW (ref 6.0–8.3)

## 2018-05-06 LAB — CBC
HCT: 44.4 % (ref 39.0–52.0)
Hemoglobin: 15.5 g/dL (ref 13.0–17.0)
MCHC: 34.9 g/dL (ref 30.0–36.0)
MCV: 91.1 fl (ref 78.0–100.0)
Platelets: 261 10*3/uL (ref 150.0–400.0)
RBC: 4.87 Mil/uL (ref 4.22–5.81)
RDW: 14.2 % (ref 11.5–15.5)
WBC: 9.3 10*3/uL (ref 4.0–10.5)

## 2018-05-06 LAB — LIPID PANEL
Cholesterol: 184 mg/dL (ref 0–200)
HDL: 44.6 mg/dL (ref >39.00)
LDL Cholesterol: 123 mg/dL — ABNORMAL HIGH (ref 0–99)
NonHDL: 139.86
Total CHOL/HDL Ratio: 4
Triglycerides: 85 mg/dL (ref 0.0–149.0)
VLDL: 17 mg/dL (ref 0.0–40.0)

## 2018-05-06 LAB — TSH: TSH: 1.33 u[IU]/mL (ref 0.35–4.50)

## 2018-05-07 ENCOUNTER — Ambulatory Visit (INDEPENDENT_AMBULATORY_CARE_PROVIDER_SITE_OTHER)
Admission: RE | Admit: 2018-05-07 | Discharge: 2018-05-07 | Disposition: A | Discharge status: Home / Self Care | Payer: BLUE CROSS/BLUE SHIELD | Source: Ambulatory Visit | Attending: Family Medicine | Admitting: Family Medicine

## 2018-05-07 ENCOUNTER — Ambulatory Visit (INDEPENDENT_AMBULATORY_CARE_PROVIDER_SITE_OTHER): Payer: BLUE CROSS/BLUE SHIELD | Admitting: Family Medicine

## 2018-05-07 ENCOUNTER — Encounter: Payer: Self-pay | Admitting: Family Medicine

## 2018-05-07 VITALS — BP 130/84 | HR 84 | Resp 16 | Ht 71.0 in | Wt 158.0 lb

## 2018-05-07 DIAGNOSIS — M5442 Lumbago with sciatica, left side: Secondary | ICD-10-CM

## 2018-05-07 DIAGNOSIS — M545 Low back pain: Secondary | ICD-10-CM | POA: Diagnosis not present

## 2018-05-07 DIAGNOSIS — G8929 Other chronic pain: Secondary | ICD-10-CM | POA: Diagnosis not present

## 2018-05-07 LAB — HEPATITIS C ANTIBODY
Hepatitis C Ab: NONREACTIVE
SIGNAL TO CUT-OFF: 0.02 (ref <1.00)

## 2018-05-07 MED ORDER — PREDNISONE 5 MG PO TABS: ORAL_TABLET | ORAL | 0 refills | Status: DC

## 2018-05-07 MED ORDER — METHOCARBAMOL 500 MG PO TABS: 500.0000 mg | ORAL_TABLET | Freq: Two times a day (BID) | ORAL | 0 refills | Status: DC | PRN

## 2018-05-07 NOTE — Progress Notes (Signed)
[name] - 63 y.o. male MRN [id number]  Date of birth: 1955-09-29  SUBJECTIVE:  Including CC & ROS.  No chief complaint on file.   [name] is a 63 y.o. male that is presenting with low back pain.  This is a chronic problem that he has dealt with for years.  He has used a Land in the past.  Tried ibuprofen.  Feels the pain across the bilateral lower back.  Denies any radicular symptoms.  Pain is intermittent in nature.  Is a sharp and squeezing type pain.  Denies any urinary incontinence or saddle anesthesia.  He drinks a lot of caffeine throughout the day.    Review of Systems  Constitutional: Negative for fever.  HENT: Negative for congestion.   Respiratory: Negative for cough.   Cardiovascular: Negative for chest pain.  Gastrointestinal: Negative for abdominal pain.  Musculoskeletal: Positive for back pain.  Skin: Negative for color change.  Neurological: Negative for weakness.  Hematological: Negative for adenopathy.  Psychiatric/Behavioral: Negative for agitation.    HISTORY: Past Medical, Surgical, Social, and Family History Reviewed & Updated per EMR.   Pertinent Historical Findings include:  Past Medical History:  Diagnosis Date  . Low back pain     Past Surgical History:  Procedure Laterality Date  . APPENDECTOMY    . CERVICAL SPINE SURGERY    . CHEST TUBE INSERTION     pnx tx  . HERNIA REPAIR  09/09/2017   inguinal  . MANDIBLE FRACTURE SURGERY      No Known Allergies  Family History  Problem Relation Age of Onset  . Cancer Mother   . Breast cancer Sister   . Early death Paternal Grandfather      Social History   Socioeconomic History  . Marital status: Divorced    Spouse name: Not on file  . Number of children: Not on file  . Years of education: Not on file  . Highest education level: Not on file  Occupational History  . Not on file  Social Needs  . Financial resource strain: Not on file  . Food insecurity:    Worry:  Not on file    Inability: Not on file  . Transportation needs:    Medical: Not on file    Non-medical: Not on file  Tobacco Use  . Smoking status: Former Games developer  . Smokeless tobacco: Never Used  Substance and Sexual Activity  . Alcohol use: No  . Drug use: No  . Sexual activity: Not on file  Lifestyle  . Physical activity:    Days per week: Not on file    Minutes per session: Not on file  . Stress: Not on file  Relationships  . Social connections:    Talks on phone: Not on file    Gets together: Not on file    Attends religious service: Not on file    Active member of club or organization: Not on file    Attends meetings of clubs or organizations: Not on file    Relationship status: Not on file  . Intimate partner violence:    Fear of current or ex partner: Not on file    Emotionally abused: Not on file    Physically abused: Not on file    Forced sexual activity: Not on file  Other Topics Concern  . Not on file  Social History Narrative  . Not on file     PHYSICAL EXAM:  VS: BP 130/84  Pulse 84   Resp 16   Ht 5\' 11"  (1.803 m)   Wt 158 lb (71.7 kg)   SpO2 98%   BMI 22.04 kg/m  Physical Exam Gen: NAD, alert, cooperative with exam, well-appearing ENT: normal lips, normal nasal mucosa,  Eye: normal EOM, normal conjunctiva and lids CV:  no edema, +2 pedal pulses   Resp: no accessory muscle use, non-labored,  Skin: no rashes, no areas of induration  Neuro: normal tone, normal sensation to touch Psych:  normal insight, alert and oriented MSK:  Back: No tenderness to palpation of the midline lumbar spine. Some tenderness palpation of the paraspinal muscles of lower back Normal internal and external rotation of the hips. Normal strength resistance with hip flexion Normal strength resistance with knee extension and flexion. Normal plantarflexion and dorsiflexion. Normal gait. Negative straight leg raise bilaterally Neurovascular intact     ASSESSMENT &  PLAN:   Chronic midline low back pain with left-sided sciatica Seems to have a component of a spasm with some radicular symptoms.  -X-ray -Muscle relaxer and prednisone -Can consider gabapentin -If no improvement consider physical therapy or trigger point injections. -Counseled on home exercise therapy and supportive care

## 2018-05-07 NOTE — Patient Instructions (Addendum)
Nice to meet you  Please try the prednisone. Please do not take ibuprofen while on the prednisone  Please take the muscle relaxer after the prednisone. You can take this with the ibuprofen  Please try heat on the area  Please try the exercises  I will call you with the results from today  Please see me back in 3-4 weeks if no better.

## 2018-05-10 ENCOUNTER — Other Ambulatory Visit: Payer: Self-pay | Admitting: Nurse Practitioner

## 2018-05-10 DIAGNOSIS — R899 Unspecified abnormal finding in specimens from other organs, systems and tissues: Secondary | ICD-10-CM

## 2018-05-10 MED ORDER — ATORVASTATIN CALCIUM 10 MG PO TABS: 10.0000 mg | ORAL_TABLET | Freq: Every day | ORAL | 1 refills | Status: DC

## 2018-05-10 NOTE — Assessment & Plan Note (Signed)
Seems to have a component of a spasm with some radicular symptoms.  -X-ray -Muscle relaxer and prednisone -Can consider gabapentin -If no improvement consider physical therapy or trigger point injections. -Counseled on home exercise therapy and supportive care

## 2018-05-11 ENCOUNTER — Telehealth: Payer: Self-pay | Admitting: *Deleted

## 2018-05-11 ENCOUNTER — Telehealth: Payer: Self-pay | Admitting: Family Medicine

## 2018-05-11 NOTE — Telephone Encounter (Signed)
Patient returned call for results:  Left VM for patient. If he calls back please have him speak with a nurse/CMA and inform that his back xrays show degenerative changes. The [facility] can report results to patient.   If any questions then please take the best time and phone number to call and I will try to call him back.   Myra Rude, MD [facility] 05/11/2018, 9:49 AM  Patient notified of provider message- he is off work today and would like a call back if possible- he did report he has not picked up medication due to he does not get paid until next week.

## 2018-05-11 NOTE — Telephone Encounter (Signed)
Left VM for patient. If he calls back please have him speak with a nurse/CMA and inform that his back xrays show degenerative changes. The [facility] can report results to patient.   If any questions then please take the best time and phone number to call and I will try to call him back.   Myra Rude, MD [facility] 05/11/2018, 9:49 AM

## 2018-05-12 NOTE — Telephone Encounter (Signed)
Spoke with patient about xray.   Myra Rude, MD [facility] 05/12/2018, 10:07 AM

## 2018-06-04 ENCOUNTER — Encounter: Payer: Self-pay | Admitting: Family Medicine

## 2018-06-04 ENCOUNTER — Other Ambulatory Visit (INDEPENDENT_AMBULATORY_CARE_PROVIDER_SITE_OTHER): Payer: BLUE CROSS/BLUE SHIELD

## 2018-06-04 ENCOUNTER — Ambulatory Visit (INDEPENDENT_AMBULATORY_CARE_PROVIDER_SITE_OTHER): Payer: BLUE CROSS/BLUE SHIELD | Admitting: Family Medicine

## 2018-06-04 VITALS — BP 142/88 | HR 77 | Ht 71.0 in | Wt 155.0 lb

## 2018-06-04 DIAGNOSIS — G8929 Other chronic pain: Secondary | ICD-10-CM | POA: Diagnosis not present

## 2018-06-04 DIAGNOSIS — R252 Cramp and spasm: Secondary | ICD-10-CM | POA: Insufficient documentation

## 2018-06-04 DIAGNOSIS — M5442 Lumbago with sciatica, left side: Secondary | ICD-10-CM | POA: Diagnosis not present

## 2018-06-04 LAB — IRON,TIBC AND FERRITIN PANEL
%SAT: 25 % (calc) (ref 20–48)
Ferritin: 36 ng/mL (ref 24–380)
IRON: 95 ug/dL (ref 50–180)
TIBC: 379 mcg/dL (calc) (ref 250–425)

## 2018-06-04 LAB — MAGNESIUM: Magnesium: 1.8 mg/dL (ref 1.5–2.5)

## 2018-06-04 NOTE — Patient Instructions (Signed)
Good to see you  Please take the mobic after the prednisone is done  Please try the lifting belt  Please try heat and massage on the lower back  Please try the exercises Please see me back in 4-6 weeks.

## 2018-06-04 NOTE — Assessment & Plan Note (Signed)
Has significant cramping that is been ongoing.  He drinks several caffeinated beverages per day and limited water intake.  Likely related more to this. -Magnesium and iron testing. -Counseled on hydration

## 2018-06-04 NOTE — Progress Notes (Signed)
[name] - [age] y.o. male MRN [id number]  Date of birth: [birth year]  SUBJECTIVE:  Including CC & ROS.  Chief Complaint  Patient presents with  . Follow-up    low back pain    [name] is a 63 y.o. male that is following up for his lower back pain.  He has just started his medications yesterday.  Has started the prednisone and takes a muscle relaxer at night.  Still has the pain intermittently.  Pain is worse in the morning when he feels stiff.  No radicular symptoms.  Seems to improve when he starts moving around.  Does a lot of lifting at work.  He is cognizant on how to lift..  Has leg cramping on a daily basis and usually at night.  He feels it in his upper and lower legs.  He drinks several drinks of caffeine a day.  He tries to drink water but this is limited.   Independent review of the lumbar spine x-ray from 1/3 shows mild degenerative changes of the lumbar spine.  Review of Systems  Constitutional: Negative for fever.  HENT: Negative for congestion.   Respiratory: Negative for cough.   Cardiovascular: Negative for chest pain.  Gastrointestinal: Negative for abdominal pain.  Musculoskeletal: Positive for back pain.  Skin: Negative for color change.    HISTORY: Past Medical, Surgical, Social, and Family History Reviewed & Updated per EMR.   Pertinent Historical Findings include:  Past Medical History:  Diagnosis Date  . Low back pain     Past Surgical History:  Procedure Laterality Date  . APPENDECTOMY    . CERVICAL SPINE SURGERY    . CHEST TUBE INSERTION     pnx tx  . HERNIA REPAIR  09/09/2017   inguinal  . MANDIBLE FRACTURE SURGERY      No Known Allergies  Family History  Problem Relation Age of Onset  . Cancer Mother   . Breast cancer Sister   . Early death Paternal Grandfather      Social History   Socioeconomic History  . Marital status: Divorced    Spouse name: Not on file  . Number of children: Not on file  . Years of education:  Not on file  . Highest education level: Not on file  Occupational History  . Not on file  Social Needs  . Financial resource strain: Not on file  . Food insecurity:    Worry: Not on file    Inability: Not on file  . Transportation needs:    Medical: Not on file    Non-medical: Not on file  Tobacco Use  . Smoking status: Former Games developermoker  . Smokeless tobacco: Never Used  Substance and Sexual Activity  . Alcohol use: No  . Drug use: No  . Sexual activity: Not on file  Lifestyle  . Physical activity:    Days per week: Not on file    Minutes per session: Not on file  . Stress: Not on file  Relationships  . Social connections:    Talks on phone: Not on file    Gets together: Not on file    Attends religious service: Not on file    Active member of club or organization: Not on file    Attends meetings of clubs or organizations: Not on file    Relationship status: Not on file  . Intimate partner violence:    Fear of current or ex partner: Not on file    Emotionally  abused: Not on file    Physically abused: Not on file    Forced sexual activity: Not on file  Other Topics Concern  . Not on file  Social History Narrative  . Not on file     PHYSICAL EXAM:  VS: BP (!) 142/88   Pulse 77   Ht 5\' 11"  (1.803 m)   Wt 155 lb (70.3 kg)   SpO2 98%   BMI 21.62 kg/m  Physical Exam Gen: NAD, alert, cooperative with exam, well-appearing ENT: normal lips, normal nasal mucosa,  Eye: normal EOM, normal conjunctiva and lids CV:  no edema, +2 pedal pulses   Resp: no accessory muscle use, non-labored,  Skin: no rashes, no areas of induration  Neuro: normal tone, normal sensation to touch Psych:  normal insight, alert and oriented MSK:  Back:  No midline tenderness to pain of the lumbar spine. Some mild tenderness to palpation of the paraspinal muscles on the lumbar left and right. Hamstring on the right is more tight than on the left. Normal internal and external rotation of the  hips. Normal knee flexion and extension Normal strength resistance with plantarflexion and dorsiflexion. Negative straight leg raise bilaterally. Neurovascular intact      ASSESSMENT & PLAN:   Chronic midline low back pain with left-sided sciatica Has less of radicular symptoms today more of the pain associated in the lower back.  Has just been starting the medications that were prescribed last time.  Has a fairly physical job with lots of lifting and bending.  Still appears to be more muscular in nature. -Continue medications and counseled on the use. -Continue home exercise program and supportive care. -Counseled on proper lifting techniques and possibly a lifting belt. -If no improvement consider physical therapy  Leg cramping Has significant cramping that is been ongoing.  He drinks several caffeinated beverages per day and limited water intake.  Likely related more to this. -Magnesium and iron testing. -Counseled on hydration

## 2018-06-04 NOTE — Assessment & Plan Note (Signed)
Has less of radicular symptoms today more of the pain associated in the lower back.  Has just been starting the medications that were prescribed last time.  Has a fairly physical job with lots of lifting and bending.  Still appears to be more muscular in nature. -Continue medications and counseled on the use. -Continue home exercise program and supportive care. -Counseled on proper lifting techniques and possibly a lifting belt. -If no improvement consider physical therapy

## 2018-06-07 ENCOUNTER — Telehealth: Payer: Self-pay | Admitting: Family Medicine

## 2018-06-07 NOTE — Telephone Encounter (Signed)
Left VM for patient. If he calls back please have him speak with a nurse/CMA and inform that his Magnesium is normal. His ferritin is on the low side of normal. We could consider adding iron to his regimen to see if that helps with the cramps. The [facility] can report results to patient.   If any questions then please take the best time and phone number to call and I will try to call him back.   Myra Rude, MD [facility] 06/07/2018, 4:58 PM  '

## 2018-06-09 ENCOUNTER — Other Ambulatory Visit: Payer: Self-pay

## 2018-06-09 ENCOUNTER — Ambulatory Visit (AMBULATORY_SURGERY_CENTER): Payer: Self-pay | Admitting: *Deleted

## 2018-06-09 ENCOUNTER — Encounter: Payer: Self-pay | Admitting: Gastroenterology

## 2018-06-09 VITALS — Ht 71.0 in | Wt 152.6 lb

## 2018-06-09 DIAGNOSIS — Z1211 Encounter for screening for malignant neoplasm of colon: Secondary | ICD-10-CM

## 2018-06-09 MED ORDER — SUPREP BOWEL PREP KIT 17.5-3.13-1.6 GM/177ML PO SOLN: 1.0000 | Freq: Once | ORAL | 0 refills | Status: AC

## 2018-06-09 NOTE — Progress Notes (Signed)
No egg or soy allergy known to patient  No issues with past sedation with any surgeries  or procedures, no intubation problems  No diet pills per patient No home 02 use per patient  No blood thinners per patient  Pt denies issues with constipation  No A fib or A flutter  EMMI video sent to pt's e mail  

## 2018-06-09 NOTE — Telephone Encounter (Signed)
Pt given recommendations per notes of Dr. Jordan Likes on 06/07/18. Pt verbalized understanding. Pt is agreeable to adding iron to his regimen and wants to know if he needs to take an over the counter supplement or if he would be given a prescription.

## 2018-06-10 NOTE — Telephone Encounter (Signed)
Left VM for patient. If he calls back please have him speak with a nurse/CMA and inform that he could start with an over the counter and if he doesn't notice an improvement then we could send a prescription. The [facility] can report results to patient.   If any questions then please take the best time and phone number to call and I will try to call him back.   Myra Rude, MD [facility] 06/10/2018, 10:51 AM

## 2018-06-11 NOTE — Telephone Encounter (Signed)
Patient notified of message- he will try iron supplement and call back if it does not help.

## 2018-06-14 ENCOUNTER — Telehealth: Payer: Self-pay | Admitting: Nurse Practitioner

## 2018-06-14 NOTE — Telephone Encounter (Signed)
Copied from CRM [id number]. Topic: Quick Communication - See Telephone Encounter >> Jun 14, 2018 11:08 AM Aretta Nip wrote: CRM for notification. See Telephone encounter for: 06/14/18. PT have issue with this med. Causing nausea, stop taking or call in something else, return call meloxicam (MOBIC) 7.5 MG tablet ph [phone] after 1:00 pm

## 2018-06-23 ENCOUNTER — Encounter: Payer: Self-pay | Admitting: Gastroenterology

## 2018-06-23 ENCOUNTER — Telehealth: Payer: Self-pay

## 2018-06-23 ENCOUNTER — Ambulatory Visit: Payer: BLUE CROSS/BLUE SHIELD | Admitting: Gastroenterology

## 2018-06-23 VITALS — BP 154/94 | HR 69 | Temp 97.3°F | Ht 71.0 in | Wt 155.0 lb

## 2018-06-23 DIAGNOSIS — T884XXA Failed or difficult intubation, initial encounter: Secondary | ICD-10-CM | POA: Insufficient documentation

## 2018-06-23 DIAGNOSIS — Z1211 Encounter for screening for malignant neoplasm of colon: Secondary | ICD-10-CM

## 2018-06-23 MED ORDER — SODIUM CHLORIDE 0.9 % IV SOLN: 500.0000 mL | Freq: Once | INTRAVENOUS | Status: DC

## 2018-06-23 NOTE — Progress Notes (Signed)
Pt's states no medical or surgical changes since previsit or office visit. 

## 2018-06-23 NOTE — Progress Notes (Signed)
Patients colonoscopy was cancelled today because of his history of having a difficult airway with an awake intubation. After talking with the doctor and CRNA, it was discussed that for safety reasons the patient would need to be rescheduled at the hospital for this procedure. The patient and his care partner were advised of this and agreed and understood. Nurse notified downstairs and will schedule this procedure for April when the schedule comes out.

## 2018-06-23 NOTE — Telephone Encounter (Signed)
Patient came to the [facility] for a scheduled screening colonoscopy on 06/23/2018. Once admitted the CRNA discovered that he was a very difficult airway with a history of an awake intubation. This procedure was cancelled at the [facility] and tried to be rescheduled today at [facility] with no success. Dr. Myrtie Neither wants patient to rescheduled at the hospital at his earliest convenience for a routine screening colonoscopy. Nurse notified in office of this request. I explained the situation with the patient and care partner and both agreed and understood.

## 2018-06-23 NOTE — Telephone Encounter (Signed)
[name],    Please contact this patient when the April schedule is released, and offer him my [facility] outpatient endoscopy block for this routine screening colonoscopy.      Hopefully, this can be arranged over the phone to try and save him a visit to office (since his procedure had to be canceled after he had already prepped).  - HD

## 2018-07-02 ENCOUNTER — Ambulatory Visit (INDEPENDENT_AMBULATORY_CARE_PROVIDER_SITE_OTHER): Payer: BLUE CROSS/BLUE SHIELD | Admitting: Family Medicine

## 2018-07-02 ENCOUNTER — Encounter: Payer: Self-pay | Admitting: Family Medicine

## 2018-07-02 VITALS — BP 140/76 | HR 69 | Resp 16 | Ht 71.0 in | Wt 158.0 lb

## 2018-07-02 DIAGNOSIS — G8929 Other chronic pain: Secondary | ICD-10-CM

## 2018-07-02 DIAGNOSIS — M5442 Lumbago with sciatica, left side: Secondary | ICD-10-CM | POA: Diagnosis not present
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
## 2018-07-02 NOTE — Assessment & Plan Note (Signed)
Seems to be worsening of the more radicular symptoms today.  Has had limited improvement with medications. -Referral to physical therapy. -MRI to evaluate for nerve impingement. -Counseled on supportive care

## 2018-07-02 NOTE — Patient Instructions (Signed)
Good to see you  They will give you a call about setting up physical therapy  You will get a call to schedule the MRI. I will call you after it is completed to decide the next step

## 2018-07-06 ENCOUNTER — Other Ambulatory Visit: Payer: Self-pay

## 2018-07-06 DIAGNOSIS — Z1211 Encounter for screening for malignant neoplasm of colon: Secondary | ICD-10-CM

## 2018-07-06 NOTE — Telephone Encounter (Signed)
Pt scheduled for colon at [facility] 08/17/18@9 :30am, pt to arrive there at 8am. Attempted to call pt with appt but phone rings then goes to a fast busy signal. Will try again.

## 2018-07-08 MED ORDER — NA SULFATE-K SULFATE-MG SULF 17.5-3.13-1.6 GM/177ML PO SOLN: 1.0000 | Freq: Once | ORAL | 0 refills | Status: AC

## 2018-07-08 NOTE — Telephone Encounter (Signed)
After multiple attempts have been unable to reach pt by phone. Appt information and prep instructions mailed to pt. Script sent to pharmacy.

## 2018-07-11 ENCOUNTER — Ambulatory Visit
Admission: RE | Admit: 2018-07-11 | Discharge: 2018-07-11 | Disposition: A | Discharge status: Home / Self Care | Payer: BLUE CROSS/BLUE SHIELD | Source: Ambulatory Visit | Attending: Family Medicine | Admitting: Family Medicine

## 2018-07-11 DIAGNOSIS — M5442 Lumbago with sciatica, left side: Principal | ICD-10-CM

## 2018-07-11 DIAGNOSIS — G8929 Other chronic pain: Secondary | ICD-10-CM

## 2018-07-11 DIAGNOSIS — M48061 Spinal stenosis, lumbar region without neurogenic claudication: Secondary | ICD-10-CM | POA: Diagnosis not present

## 2018-07-13 ENCOUNTER — Telehealth: Payer: Self-pay | Admitting: Family Medicine

## 2018-07-13 NOTE — Telephone Encounter (Signed)
Unable to leave VM for patient. If he calls back please have him speak with a nurse/CMA and inform that his MRI shows a possible nerve irritation at S1 which could be responsible for his symptoms. We could try an epidural. The [facility] can report results to patient.   If any questions then please take the best time and phone number to call and I will try to call him back.   Myra Rude, MD [facility] 07/13/2018, 9:54 AM

## 2018-07-16 ENCOUNTER — Telehealth: Payer: Self-pay | Admitting: Nurse Practitioner

## 2018-07-16 DIAGNOSIS — G8929 Other chronic pain: Secondary | ICD-10-CM

## 2018-07-16 DIAGNOSIS — M5442 Lumbago with sciatica, left side: Principal | ICD-10-CM

## 2018-07-16 NOTE — Telephone Encounter (Signed)
Pt given  results per notes of Dr Jordan Likes on 07/13/2018. Pt verbalized understanding. Pt would like to try Epidural

## 2018-07-21 NOTE — Telephone Encounter (Signed)
Ordered epidural   Myra Rude, MD [facility] 07/21/2018, 9:31 AM

## 2018-07-21 NOTE — Addendum Note (Signed)
Addended by: Myra Rude on: 07/21/2018 09:31 AM   Modules accepted: Orders

## 2018-07-22 ENCOUNTER — Telehealth: Payer: Self-pay

## 2018-07-22 NOTE — Telephone Encounter (Signed)
Tried to reach patient. The voicemail box is full and cannot leave a message. Will try again later.

## 2018-07-22 NOTE — Telephone Encounter (Signed)
Patient is scheduled for 4-14-220 for a colonoscopy at [facility] endo

## 2018-07-22 NOTE — Telephone Encounter (Signed)
Screening colonoscopy being done at the hospital for history of difficult airway.  Due to current COVID-19 restrictions on routine procedures, please cancel this procedure for April 14 and place a recall for June 2020.  Please contact patient.

## 2018-07-26 ENCOUNTER — Other Ambulatory Visit: Payer: Self-pay | Admitting: *Deleted

## 2018-07-26 MED ORDER — ATORVASTATIN CALCIUM 10 MG PO TABS: 10.0000 mg | ORAL_TABLET | Freq: Every day | ORAL | 1 refills | Status: DC

## 2018-07-26 NOTE — Telephone Encounter (Signed)
Tried calling pt. Voicemail still full. Letter mailed to pt informing that Hospital Colon had to be cancelled due to COVID-19. Asked pt to call the office with a good contact number. Also, a recall has been placed by Sheralyn Boatman for a Colon at [facility] for June 2020.

## 2018-08-17 ENCOUNTER — Ambulatory Visit (HOSPITAL_COMMUNITY): Admit: 2018-08-17 | Payer: BLUE CROSS/BLUE SHIELD | Admitting: Gastroenterology

## 2018-08-17 ENCOUNTER — Encounter (HOSPITAL_COMMUNITY): Payer: Self-pay

## 2018-08-17 SURGERY — COLONOSCOPY WITH PROPOFOL
Anesthesia: Monitor Anesthesia Care

## 2018-09-30 ENCOUNTER — Telehealth: Payer: Self-pay

## 2018-09-30 NOTE — Telephone Encounter (Signed)
Tried to call patient. Mailbox is full and cannot leave a voicemail. Hospital endo schedule is now open for 6-15- 2020

## 2018-10-06 ENCOUNTER — Other Ambulatory Visit: Payer: Self-pay

## 2018-10-06 ENCOUNTER — Ambulatory Visit
Admission: RE | Admit: 2018-10-06 | Discharge: 2018-10-06 | Disposition: A | Discharge status: Home / Self Care | Payer: BLUE CROSS/BLUE SHIELD | Source: Ambulatory Visit | Attending: Family Medicine | Admitting: Family Medicine

## 2018-10-06 DIAGNOSIS — M4727 Other spondylosis with radiculopathy, lumbosacral region: Secondary | ICD-10-CM | POA: Diagnosis not present

## 2018-10-06 DIAGNOSIS — G8929 Other chronic pain: Secondary | ICD-10-CM

## 2018-10-06 MED ORDER — IOPAMIDOL (ISOVUE-M 200) INJECTION 41%
1.0000 mL | Freq: Once | INTRAMUSCULAR | Status: AC
  Administered 2018-10-06: 1 mL via EPIDURAL

## 2018-10-06 MED ORDER — METHYLPREDNISOLONE ACETATE 40 MG/ML INJ SUSP (RADIOLOG
120.0000 mg | Freq: Once | INTRAMUSCULAR | Status: AC
  Administered 2018-10-06: 120 mg via EPIDURAL

## 2018-10-06 NOTE — Discharge Instructions (Signed)

## 2018-10-14 NOTE — Telephone Encounter (Signed)
Patient at this time wants to wait to schedule his hospital case due to the COVID 19 restriction and the quarantine restrictions.

## 2018-10-28 ENCOUNTER — Ambulatory Visit: Payer: BLUE CROSS/BLUE SHIELD | Admitting: Nurse Practitioner

## 2018-11-09 ENCOUNTER — Ambulatory Visit (INDEPENDENT_AMBULATORY_CARE_PROVIDER_SITE_OTHER): Payer: BC Managed Care – PPO | Admitting: Family Medicine

## 2018-11-09 ENCOUNTER — Other Ambulatory Visit: Payer: Self-pay

## 2018-11-09 ENCOUNTER — Encounter: Payer: Self-pay | Admitting: Family Medicine

## 2018-11-09 VITALS — BP 147/92 | HR 67 | Ht 71.0 in | Wt 155.0 lb

## 2018-11-09 DIAGNOSIS — G8929 Other chronic pain: Secondary | ICD-10-CM

## 2018-11-09 DIAGNOSIS — M5442 Lumbago with sciatica, left side: Secondary | ICD-10-CM | POA: Diagnosis not present

## 2018-11-09 MED ORDER — METHOCARBAMOL 500 MG PO TABS: 500.0000 mg | ORAL_TABLET | Freq: Two times a day (BID) | ORAL | 1 refills | Status: DC | PRN

## 2018-11-09 MED ORDER — IBUPROFEN-FAMOTIDINE 800-26.6 MG PO TABS: 1.0000 | ORAL_TABLET | Freq: Three times a day (TID) | ORAL | 3 refills | Status: DC
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
## 2018-11-09 NOTE — Patient Instructions (Signed)
Good to see you Please try physical therapy  Please try heat on the lower back  Please try the duexis instead of taking regular advil  Please send me a message in MyChart with any questions or updates.  Please see me back in 4 weeks.   --Dr. Raeford Razor

## 2018-11-10 NOTE — Assessment & Plan Note (Signed)
Acute on chronic exacerbation of his back pain.  Seems to be more concentrated in the lumbar area and related to spasm as opposed to his sciatica. -Duexis and Robaxin. -Referral to physical therapy. -If no improvement may need to consider epidural or facet injections.

## 2018-11-17 DIAGNOSIS — M6281 Muscle weakness (generalized): Secondary | ICD-10-CM | POA: Diagnosis not present

## 2018-11-17 DIAGNOSIS — R262 Difficulty in walking, not elsewhere classified: Secondary | ICD-10-CM | POA: Diagnosis not present

## 2018-11-17 DIAGNOSIS — M545 Low back pain: Secondary | ICD-10-CM | POA: Diagnosis not present

## 2018-11-17 DIAGNOSIS — M5416 Radiculopathy, lumbar region: Secondary | ICD-10-CM | POA: Diagnosis not present

## 2018-11-18 DIAGNOSIS — L600 Ingrowing nail: Secondary | ICD-10-CM | POA: Diagnosis not present

## 2018-11-19 DIAGNOSIS — M545 Low back pain: Secondary | ICD-10-CM | POA: Diagnosis not present

## 2018-11-19 DIAGNOSIS — M6281 Muscle weakness (generalized): Secondary | ICD-10-CM | POA: Diagnosis not present

## 2018-11-19 DIAGNOSIS — R262 Difficulty in walking, not elsewhere classified: Secondary | ICD-10-CM | POA: Diagnosis not present

## 2018-11-19 DIAGNOSIS — M5416 Radiculopathy, lumbar region: Secondary | ICD-10-CM | POA: Diagnosis not present

## 2018-11-23 DIAGNOSIS — R262 Difficulty in walking, not elsewhere classified: Secondary | ICD-10-CM | POA: Diagnosis not present

## 2018-11-23 DIAGNOSIS — M545 Low back pain: Secondary | ICD-10-CM | POA: Diagnosis not present

## 2018-11-23 DIAGNOSIS — M5416 Radiculopathy, lumbar region: Secondary | ICD-10-CM | POA: Diagnosis not present

## 2018-11-23 DIAGNOSIS — M6281 Muscle weakness (generalized): Secondary | ICD-10-CM | POA: Diagnosis not present

## 2018-11-26 NOTE — Telephone Encounter (Signed)
No voicemail and no answer on numbers listed in Epic and the [phone] is a wrong number.

## 2018-12-01 DIAGNOSIS — M545 Low back pain: Secondary | ICD-10-CM | POA: Diagnosis not present

## 2018-12-01 DIAGNOSIS — R262 Difficulty in walking, not elsewhere classified: Secondary | ICD-10-CM | POA: Diagnosis not present

## 2018-12-01 DIAGNOSIS — M5416 Radiculopathy, lumbar region: Secondary | ICD-10-CM | POA: Diagnosis not present

## 2018-12-01 DIAGNOSIS — L600 Ingrowing nail: Secondary | ICD-10-CM | POA: Diagnosis not present

## 2018-12-01 DIAGNOSIS — M6281 Muscle weakness (generalized): Secondary | ICD-10-CM | POA: Diagnosis not present

## 2018-12-07 ENCOUNTER — Encounter: Payer: Self-pay | Admitting: Family Medicine

## 2018-12-07 ENCOUNTER — Ambulatory Visit (INDEPENDENT_AMBULATORY_CARE_PROVIDER_SITE_OTHER): Payer: BC Managed Care – PPO | Admitting: Family Medicine

## 2018-12-07 ENCOUNTER — Other Ambulatory Visit: Payer: Self-pay

## 2018-12-07 VITALS — BP 137/69 | HR 71 | Ht 71.0 in | Wt 158.0 lb

## 2018-12-07 DIAGNOSIS — M5442 Lumbago with sciatica, left side: Secondary | ICD-10-CM | POA: Diagnosis not present

## 2018-12-07 DIAGNOSIS — G8929 Other chronic pain: Secondary | ICD-10-CM

## 2018-12-07 NOTE — Patient Instructions (Signed)
Good to see you Please try compression on your thigh  Please try heat on the lower back  Please continue physical therapy  You will get a call to schedule the facet injections  Please send me a message in MyChart with any questions or updates.  Please see [place] back after your facet injections or make a virtual visit.   --Dr. Raeford Razor
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
## 2018-12-07 NOTE — Assessment & Plan Note (Signed)
Acute exacerbation of his underlying chronic back pain.  Today seems mostly localized to the lower back.  Does have it on the left but bilateral at times.  MRI was revealing for mild facet changes.  Could be related to facet joints. -Continue physical therapy. -Can take ibuprofen or Duexis counseled on the use. -Counseled on home exercise therapy and supportive care. -Try facet injections L5-S1 -Can continue muscle relaxer. -May need to consider Elavil or nortriptyline

## 2018-12-15 ENCOUNTER — Other Ambulatory Visit: Payer: Self-pay | Admitting: Family Medicine

## 2018-12-15 DIAGNOSIS — M5442 Lumbago with sciatica, left side: Secondary | ICD-10-CM

## 2018-12-15 DIAGNOSIS — M6281 Muscle weakness (generalized): Secondary | ICD-10-CM | POA: Diagnosis not present

## 2018-12-15 DIAGNOSIS — M5416 Radiculopathy, lumbar region: Secondary | ICD-10-CM | POA: Diagnosis not present

## 2018-12-15 DIAGNOSIS — M545 Low back pain: Secondary | ICD-10-CM | POA: Diagnosis not present

## 2018-12-15 DIAGNOSIS — R262 Difficulty in walking, not elsewhere classified: Secondary | ICD-10-CM | POA: Diagnosis not present

## 2018-12-15 DIAGNOSIS — G8929 Other chronic pain: Secondary | ICD-10-CM

## 2018-12-15 DIAGNOSIS — L600 Ingrowing nail: Secondary | ICD-10-CM | POA: Diagnosis not present

## 2018-12-16 ENCOUNTER — Ambulatory Visit
Admission: RE | Admit: 2018-12-16 | Discharge: 2018-12-16 | Disposition: A | Discharge status: Home / Self Care | Payer: BC Managed Care – PPO | Source: Ambulatory Visit | Attending: Family Medicine | Admitting: Family Medicine

## 2018-12-16 ENCOUNTER — Inpatient Hospital Stay: Admission: RE | Admit: 2018-12-16 | Payer: BC Managed Care – PPO | Source: Ambulatory Visit

## 2018-12-16 DIAGNOSIS — M5442 Lumbago with sciatica, left side: Secondary | ICD-10-CM

## 2018-12-16 DIAGNOSIS — G8929 Other chronic pain: Secondary | ICD-10-CM

## 2018-12-16 DIAGNOSIS — M545 Low back pain: Secondary | ICD-10-CM | POA: Diagnosis not present

## 2018-12-16 MED ORDER — METHYLPREDNISOLONE ACETATE 40 MG/ML INJ SUSP (RADIOLOG
120.0000 mg | Freq: Once | INTRAMUSCULAR | Status: AC
  Administered 2018-12-16: 14:00:00 120 mg via INTRA_ARTICULAR

## 2018-12-16 MED ORDER — IOPAMIDOL (ISOVUE-M 200) INJECTION 41%
1.0000 mL | Freq: Once | INTRAMUSCULAR | Status: AC
  Administered 2018-12-16: 1 mL via INTRA_ARTICULAR

## 2018-12-16 NOTE — Telephone Encounter (Signed)
Pt. states he cannot schedule for Sept hospital schedule  at this time due to the COVID prescreen and quarantine restrictions.

## 2018-12-16 NOTE — Discharge Instructions (Signed)

## 2018-12-20 DIAGNOSIS — M5416 Radiculopathy, lumbar region: Secondary | ICD-10-CM | POA: Diagnosis not present

## 2018-12-20 DIAGNOSIS — M6281 Muscle weakness (generalized): Secondary | ICD-10-CM | POA: Diagnosis not present

## 2018-12-20 DIAGNOSIS — M545 Low back pain: Secondary | ICD-10-CM | POA: Diagnosis not present

## 2018-12-20 DIAGNOSIS — R262 Difficulty in walking, not elsewhere classified: Secondary | ICD-10-CM | POA: Diagnosis not present

## 2018-12-21 DIAGNOSIS — R262 Difficulty in walking, not elsewhere classified: Secondary | ICD-10-CM | POA: Diagnosis not present

## 2018-12-21 DIAGNOSIS — M6281 Muscle weakness (generalized): Secondary | ICD-10-CM | POA: Diagnosis not present

## 2018-12-21 DIAGNOSIS — M545 Low back pain: Secondary | ICD-10-CM | POA: Diagnosis not present

## 2018-12-21 DIAGNOSIS — M5416 Radiculopathy, lumbar region: Secondary | ICD-10-CM | POA: Diagnosis not present

## 2018-12-31 ENCOUNTER — Other Ambulatory Visit: Payer: Self-pay | Admitting: Family Medicine

## 2018-12-31 DIAGNOSIS — G8929 Other chronic pain: Secondary | ICD-10-CM

## 2019-01-12 NOTE — Telephone Encounter (Signed)
Left message to return call 

## 2019-03-07 NOTE — Telephone Encounter (Signed)
Left detailed message concerning the last hospital slot available on 12/22. Requested a return call if the patient wanted to be scheduled.

## 2019-03-11 ENCOUNTER — Other Ambulatory Visit (INDEPENDENT_AMBULATORY_CARE_PROVIDER_SITE_OTHER): Payer: BC Managed Care – PPO

## 2019-03-11 ENCOUNTER — Ambulatory Visit (INDEPENDENT_AMBULATORY_CARE_PROVIDER_SITE_OTHER): Payer: BC Managed Care – PPO | Admitting: Family

## 2019-03-11 ENCOUNTER — Telehealth: Payer: Self-pay

## 2019-03-11 ENCOUNTER — Encounter: Payer: Self-pay | Admitting: Family

## 2019-03-11 ENCOUNTER — Other Ambulatory Visit: Payer: Self-pay

## 2019-03-11 VITALS — BP 120/80 | HR 79 | Temp 98.1°F | Ht 71.0 in | Wt 160.0 lb

## 2019-03-11 DIAGNOSIS — N529 Male erectile dysfunction, unspecified: Secondary | ICD-10-CM

## 2019-03-11 DIAGNOSIS — Z1211 Encounter for screening for malignant neoplasm of colon: Secondary | ICD-10-CM

## 2019-03-11 DIAGNOSIS — R252 Cramp and spasm: Secondary | ICD-10-CM

## 2019-03-11 DIAGNOSIS — N400 Enlarged prostate without lower urinary tract symptoms: Secondary | ICD-10-CM

## 2019-03-11 DIAGNOSIS — M5442 Lumbago with sciatica, left side: Secondary | ICD-10-CM

## 2019-03-11 DIAGNOSIS — E785 Hyperlipidemia, unspecified: Secondary | ICD-10-CM

## 2019-03-11 DIAGNOSIS — G8929 Other chronic pain: Secondary | ICD-10-CM

## 2019-03-11 LAB — LIPID PANEL
Cholesterol: 188 mg/dL (ref 0–200)
HDL: 44.7 mg/dL (ref >39.00)
LDL Cholesterol: 128 mg/dL — ABNORMAL HIGH (ref 0–99)
NonHDL: 142.98
Total CHOL/HDL Ratio: 4
Triglycerides: 77 mg/dL (ref 0.0–149.0)
VLDL: 15.4 mg/dL (ref 0.0–40.0)

## 2019-03-11 LAB — COMPREHENSIVE METABOLIC PANEL
ALT: 26 U/L (ref 0–53)
AST: 23 U/L (ref 0–37)
Albumin: 3.7 g/dL (ref 3.5–5.2)
Alkaline Phosphatase: 41 U/L (ref 39–117)
BUN: 25 mg/dL — ABNORMAL HIGH (ref 6–23)
CO2: 31 mEq/L (ref 19–32)
Calcium: 8.5 mg/dL (ref 8.4–10.5)
Chloride: 104 mEq/L (ref 96–112)
Creatinine, Ser: 0.81 mg/dL (ref 0.40–1.50)
GFR: 96.3 mL/min (ref >60.00)
Glucose, Bld: 98 mg/dL (ref 70–99)
Potassium: 4.5 mEq/L (ref 3.5–5.1)
Sodium: 139 mEq/L (ref 135–145)
Total Bilirubin: 0.7 mg/dL (ref 0.2–1.2)
Total Protein: 5.6 g/dL — ABNORMAL LOW (ref 6.0–8.3)

## 2019-03-11 LAB — MAGNESIUM: Magnesium: 1.8 mg/dL (ref 1.5–2.5)

## 2019-03-11 LAB — VITAMIN B12: Vitamin B-12: 931 pg/mL — ABNORMAL HIGH (ref 211–911)

## 2019-03-11 LAB — CK: Total CK: 208 U/L (ref 7–232)

## 2019-03-11 LAB — VITAMIN D 25 HYDROXY (VIT D DEFICIENCY, FRACTURES): VITD: 11.9 ng/mL — ABNORMAL LOW (ref 30.00–100.00)

## 2019-03-11 MED ORDER — SILDENAFIL CITRATE 100 MG PO TABS: 50.0000 mg | ORAL_TABLET | Freq: Every day | ORAL | 3 refills | Status: DC | PRN

## 2019-03-11 MED ORDER — METHOCARBAMOL 500 MG PO TABS: 500.0000 mg | ORAL_TABLET | Freq: Three times a day (TID) | ORAL | 1 refills | Status: DC | PRN

## 2019-03-11 MED ORDER — TAMSULOSIN HCL 0.4 MG PO CAPS: 0.4000 mg | ORAL_CAPSULE | Freq: Every day | ORAL | 3 refills | Status: DC
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
## 2019-03-14 ENCOUNTER — Other Ambulatory Visit: Payer: Self-pay

## 2019-03-14 DIAGNOSIS — Z1211 Encounter for screening for malignant neoplasm of colon: Secondary | ICD-10-CM

## 2019-03-14 NOTE — Telephone Encounter (Signed)
Ordered today 

## 2019-03-23 ENCOUNTER — Telehealth: Payer: Self-pay | Admitting: Family

## 2019-03-23 NOTE — Telephone Encounter (Signed)
Copied from [place] [id number]. Topic: Quick Communication - Rx Refill/Question >> Mar 23, 2019  2:48 PM Izola Price, [place] A wrote: Medication: Vitamin D (Patient stated that he was recently seen and all medication were called in except his vitamin d supplement he was instructed to take for the next 12 weeks.)  Has the patient contacted their pharmacy? Yes (Agent: If no, request that the patient contact the pharmacy for the refill.) (Agent: If yes, when and what did the pharmacy advise?)Contact PCP  Preferred Pharmacy (with phone number or street name):   [id number] - [place], [place] - [address] [phone] (Phone) [phone] (Fax)    Agent: Please be advised that RX refills may take up to 3 business days. We ask that you follow-up with your pharmacy.

## 2019-03-23 NOTE — Telephone Encounter (Signed)
Vitamin D not current medication list

## 2019-03-24 DIAGNOSIS — G8929 Other chronic pain: Secondary | ICD-10-CM | POA: Insufficient documentation

## 2019-03-24 DIAGNOSIS — M545 Low back pain: Secondary | ICD-10-CM | POA: Diagnosis not present

## 2019-03-24 DIAGNOSIS — R030 Elevated blood-pressure reading, without diagnosis of hypertension: Secondary | ICD-10-CM | POA: Diagnosis not present

## 2019-03-25 ENCOUNTER — Other Ambulatory Visit: Payer: Self-pay | Admitting: Family

## 2019-03-25 DIAGNOSIS — Z1211 Encounter for screening for malignant neoplasm of colon: Secondary | ICD-10-CM | POA: Diagnosis not present

## 2019-03-25 MED ORDER — VITAMIN D (ERGOCALCIFEROL) 1.25 MG (50000 UNIT) PO CAPS: 50000.0000 [IU] | ORAL_CAPSULE | ORAL | 0 refills | Status: DC

## 2019-03-30 LAB — COLOGUARD: Cologuard: NEGATIVE

## 2019-04-12 ENCOUNTER — Encounter: Payer: Self-pay | Admitting: Family

## 2019-04-12 NOTE — Progress Notes (Signed)
Outside notes received. Information abstracted. Notes sent to scan.  

## 2019-04-26 ENCOUNTER — Other Ambulatory Visit: Payer: Self-pay | Admitting: Family

## 2019-04-26 ENCOUNTER — Ambulatory Visit (INDEPENDENT_AMBULATORY_CARE_PROVIDER_SITE_OTHER): Payer: BC Managed Care – PPO | Admitting: Family

## 2019-04-26 ENCOUNTER — Encounter: Payer: Self-pay | Admitting: Family

## 2019-04-26 ENCOUNTER — Other Ambulatory Visit: Payer: Self-pay

## 2019-04-26 VITALS — BP 140/82 | HR 72 | Temp 98.1°F | Ht 71.0 in | Wt 150.4 lb

## 2019-04-26 DIAGNOSIS — M5442 Lumbago with sciatica, left side: Secondary | ICD-10-CM | POA: Diagnosis not present

## 2019-04-26 DIAGNOSIS — G8929 Other chronic pain: Secondary | ICD-10-CM

## 2019-04-26 DIAGNOSIS — M25512 Pain in left shoulder: Secondary | ICD-10-CM | POA: Diagnosis not present

## 2019-04-26 MED ORDER — METHOCARBAMOL 500 MG PO TABS: 500.0000 mg | ORAL_TABLET | Freq: Three times a day (TID) | ORAL | 1 refills | Status: DC | PRN

## 2019-04-26 MED ORDER — KETOROLAC TROMETHAMINE 30 MG/ML IJ SOLN
30.0000 mg | Freq: Once | INTRAMUSCULAR | Status: AC
  Administered 2019-04-26: 30 mg via INTRAMUSCULAR
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
## 2019-04-26 NOTE — Patient Instructions (Signed)
Muscle Cramps and Spasms Muscle cramps and spasms occur when a muscle or muscles tighten and you have no control over this tightening (involuntary muscle contraction). They are a common problem and can develop in any muscle. The most common place is in the calf muscles of the leg. Muscle cramps and muscle spasms are both involuntary muscle contractions, but there are some differences between the two:  Muscle cramps are painful. They come and go and may last for a few seconds or up to 15 minutes. Muscle cramps are often more forceful and last longer than muscle spasms.  Muscle spasms may or may not be painful. They may also last just a few seconds or much longer. Certain medical conditions, such as diabetes or Parkinson's disease, can make it more likely to develop cramps or spasms. However, cramps or spasms are usually not caused by a serious underlying problem. Common causes include:  Doing more physical work or exercise than your body is ready for (overexertion).  Overuse from repeating certain movements too many times.  Remaining in a certain position for a long period of time.  Improper preparation, form, or technique while playing a sport or doing an activity.  Dehydration.  Injury.  Side effects of some medicines.  Abnormally low levels of the salts and minerals in your blood (electrolytes), especially potassium and calcium. This could happen if you are taking water pills (diuretics) or if you are pregnant. In many cases, the cause of muscle cramps or spasms is not known. Follow these instructions at home: Managing pain and stiffness      Try massaging, stretching, and relaxing the affected muscle. Do this for several minutes at a time.  If directed, apply heat to tight or tense muscles as often as told by your health care provider. Use the heat source that your health care provider recommends, such as a moist heat pack or a heating pad. ? Place a towel between your skin and  the heat source. ? Leave the heat on for 20-30 minutes. ? Remove the heat if your skin turns bright red. This is especially important if you are unable to feel pain, heat, or cold. You may have a greater risk of getting burned.  If directed, put ice on the affected area. This may help if you are sore or have pain after a cramp or spasm. ? Put ice in a plastic bag. ? Place a towel between your skin and the bag. ? Leavethe ice on for 20 minutes, 2-3 times a day.  Try taking hot showers or baths to help relax tight muscles. Eating and drinking  Drink enough fluid to keep your urine pale yellow. Staying well hydrated may help prevent cramps or spasms.  Eat a healthy diet that includes plenty of nutrients to help your muscles function. A healthy diet includes fruits and vegetables, lean protein, whole grains, and low-fat or nonfat dairy products. General instructions  If you are having frequent cramps, avoid intense exercise for several days.  Take over-the-counter and prescription medicines only as told by your health care provider.  Pay attention to any changes in your symptoms.  Keep all follow-up visits as told by your health care provider. This is important. Contact a health care provider if:  Your cramps or spasms get more severe or happen more often.  Your cramps or spasms do not improve over time. Summary  Muscle cramps and spasms occur when a muscle or muscles tighten and you have no control over this   tightening (involuntary muscle contraction).  The most common place for cramps or spasms to occur is in the calf muscles of the leg.  Massaging, stretching, and relaxing the affected muscle may relieve the cramp or spasm.  Drink enough fluid to keep your urine pale yellow. Staying well hydrated may help prevent cramps or spasms. This information is not intended to replace advice given to you by your health care provider. Make sure you discuss any questions you have with your  health care provider. Document Released: 10/11/2001 Document Revised: 09/14/2017 Document Reviewed: 09/14/2017 Elsevier Patient Education  2020 [employer]  

## 2019-04-27 ENCOUNTER — Ambulatory Visit (INDEPENDENT_AMBULATORY_CARE_PROVIDER_SITE_OTHER): Payer: BC Managed Care – PPO

## 2019-04-27 ENCOUNTER — Other Ambulatory Visit: Payer: Self-pay

## 2019-04-27 ENCOUNTER — Encounter: Payer: Self-pay | Admitting: Family Medicine

## 2019-04-27 ENCOUNTER — Ambulatory Visit (INDEPENDENT_AMBULATORY_CARE_PROVIDER_SITE_OTHER): Payer: BC Managed Care – PPO | Admitting: Family Medicine

## 2019-04-27 VITALS — BP 126/86 | HR 78 | Ht 71.0 in | Wt 149.0 lb

## 2019-04-27 DIAGNOSIS — R079 Chest pain, unspecified: Secondary | ICD-10-CM

## 2019-04-27 DIAGNOSIS — M546 Pain in thoracic spine: Secondary | ICD-10-CM | POA: Diagnosis not present

## 2019-04-27 DIAGNOSIS — J984 Other disorders of lung: Secondary | ICD-10-CM | POA: Diagnosis not present

## 2019-04-27 DIAGNOSIS — G8929 Other chronic pain: Secondary | ICD-10-CM

## 2019-04-27 MED ORDER — PREDNISONE 50 MG PO TABS: 50.0000 mg | ORAL_TABLET | Freq: Every day | ORAL | 0 refills | Status: DC

## 2019-04-27 NOTE — Patient Instructions (Addendum)
Good to see you  Ice 20 minutes 2 times daily. Usually after activity and before bed. Exercises 3 times a week.  pennsaid pinkie amount topically 2 times daily as needed.  Prednisone daily for 5 days  Take the muscle relaxer she gave you at night  Chest xray today  If significantly worsening of pain over the weekend you will need to go to emergency room
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
## 2019-04-28 ENCOUNTER — Encounter: Payer: Self-pay | Admitting: Family Medicine

## 2019-04-28 DIAGNOSIS — M546 Pain in thoracic spine: Secondary | ICD-10-CM | POA: Insufficient documentation

## 2019-04-28 NOTE — Assessment & Plan Note (Signed)
Appeared to be acute on chronic with some trigger points noted today.  Trigger point injections given, patient responded well to this and did have some relief of pain almost immediately.  Discussed with patient the that secondary to the amount of pain that he is showing that there is significant number of different things in the differential.  We discussed if this continues or seems to be worsening he needs to seek medical attention secondary to potentially gastrointestinal, or even vascular pathology that could be contributing.  Patient has had flares from this previously and has seen other providers now with acute exacerbations did make me feel like it was musculoskeletal as well as the response to the injections.  Patient has oral anti-inflammatories but at the moment we will also sent in prednisone, hopefully patient will make good response and help with the pain.  Patient knows if worsening pain to seek medical attention immediately.

## 2019-06-15 ENCOUNTER — Other Ambulatory Visit: Payer: Self-pay | Admitting: Family
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
## 2019-07-05 ENCOUNTER — Ambulatory Visit (INDEPENDENT_AMBULATORY_CARE_PROVIDER_SITE_OTHER): Payer: BC Managed Care – PPO | Admitting: Family Medicine

## 2019-07-05 ENCOUNTER — Other Ambulatory Visit: Payer: Self-pay

## 2019-07-05 ENCOUNTER — Encounter: Payer: Self-pay | Admitting: Family Medicine

## 2019-07-05 VITALS — BP 160/90 | HR 73 | Ht 71.0 in | Wt 147.0 lb

## 2019-07-05 DIAGNOSIS — G8929 Other chronic pain: Secondary | ICD-10-CM | POA: Diagnosis not present

## 2019-07-05 DIAGNOSIS — M545 Low back pain, unspecified: Secondary | ICD-10-CM

## 2019-07-05 DIAGNOSIS — M5442 Lumbago with sciatica, left side: Secondary | ICD-10-CM | POA: Diagnosis not present

## 2019-07-05 MED ORDER — PREDNISONE 50 MG PO TABS: ORAL_TABLET | ORAL | 0 refills | Status: DC

## 2019-07-05 MED ORDER — HYDROXYZINE HCL 10 MG PO TABS: 10.0000 mg | ORAL_TABLET | Freq: Three times a day (TID) | ORAL | 0 refills | Status: DC | PRN

## 2019-07-05 MED ORDER — METHYLPREDNISOLONE ACETATE 80 MG/ML IJ SUSP
80.0000 mg | Freq: Once | INTRAMUSCULAR | Status: AC
  Administered 2019-07-05: 80 mg via INTRAMUSCULAR

## 2019-07-05 MED ORDER — KETOROLAC TROMETHAMINE 60 MG/2ML IM SOLN
60.0000 mg | Freq: Once | INTRAMUSCULAR | Status: AC
  Administered 2019-07-05: 15:00:00 60 mg via INTRAMUSCULAR

## 2019-07-05 NOTE — Patient Instructions (Addendum)
Prednisone for 5 days and hydroxyzine  PT [address] for the back See me again in 4 weeks

## 2019-07-05 NOTE — Assessment & Plan Note (Addendum)
Continues have symptoms and seems that some underlying anxiety is likely contributing to some of this as well.  Prednisone given today, discussed holding other possibilities at the moment.  Patient will hold anti-inflammatories, discussed which activities to do which was to avoid.  Restart formal physical therapy secondary to this exacerbation and chronic conditions.  Social determinants of health including patient wants to avoid any surgical intervention.  Patient could be a candidate for more facet injections last ones were in August.  Follow-up again in a week.

## 2019-07-14 ENCOUNTER — Ambulatory Visit: Payer: BC Managed Care – PPO

## 2019-07-15 ENCOUNTER — Other Ambulatory Visit: Payer: Self-pay

## 2019-07-15 ENCOUNTER — Ambulatory Visit (INDEPENDENT_AMBULATORY_CARE_PROVIDER_SITE_OTHER): Payer: BC Managed Care – PPO | Admitting: Family Medicine

## 2019-07-15 ENCOUNTER — Encounter: Payer: Self-pay | Admitting: Family Medicine

## 2019-07-15 VITALS — BP 122/90 | HR 90 | Ht 71.0 in | Wt 148.0 lb

## 2019-07-15 DIAGNOSIS — S39012A Strain of muscle, fascia and tendon of lower back, initial encounter: Secondary | ICD-10-CM

## 2019-07-15 DIAGNOSIS — M5442 Lumbago with sciatica, left side: Secondary | ICD-10-CM | POA: Diagnosis not present

## 2019-07-15 DIAGNOSIS — G8929 Other chronic pain: Secondary | ICD-10-CM

## 2019-07-15 MED ORDER — KETOROLAC TROMETHAMINE 60 MG/2ML IM SOLN
60.0000 mg | Freq: Once | INTRAMUSCULAR | Status: AC
  Administered 2019-07-15: 60 mg via INTRAMUSCULAR

## 2019-07-15 MED ORDER — BACLOFEN 10 MG PO TABS: 10.0000 mg | ORAL_TABLET | Freq: Three times a day (TID) | ORAL | 1 refills | Status: DC | PRN

## 2019-07-15 MED ORDER — METHYLPREDNISOLONE ACETATE 80 MG/ML IJ SUSP
80.0000 mg | Freq: Once | INTRAMUSCULAR | Status: AC
  Administered 2019-07-15: 80 mg via INTRAMUSCULAR

## 2019-07-15 NOTE — Progress Notes (Signed)
I, [name], LAT, ATC, am serving as scribe for Dr. Lynne Leader.  [name] is a 64 y.o. male who presents to [place] at [facility] today for f/u of thoracic back pain.  He was last seen by Dr. Tamala Julian on 07/05/19 for worsening back pain and radiating pain into his L LE.  He was prescribed prednisone and hydroxyzine and given an IM Toradol and Depo 80 injection.  He was referred to outpatient PT and advised to f/u in one week.  He has had a prior epidural in June and facet injections of August of 2020.  Since his last visit, pt reports he recently put breaks on his car due to feeling good after his visit with Dr. Tamala Julian. Started with sore leg pain and now he has back pain. Canceled his PT appointment due to pain. Yesterday morning he couldn't move or get out of pain. Using heat, epsom salt bath and ice. Finished prednisone and muscle relaxer. Has been taking Ibuprofen 4 200mg  tablets at a time. ROM is limited.    Diagnostic imaging: L-spine XR- 05/07/18; L-spine MRI- 07/11/18;  Pertinent review of systems: No fevers or chills  Relevant historical information: Hyperlipidemia on atorvastatin   Exam:  BP 122/90 (BP Location: Left Arm, Patient Position: Sitting)   Pulse 90   Ht 5\' 11"  (1.803 m)   Wt 148 lb (67.1 kg)   SpO2 97%   BMI 20.64 kg/m  General: Well Developed, well nourished, and in no acute distress.    MSK: L-spine: Nontender midline.  Tender palpation bilateral lumbar paraspinal musculature.  Paraspinal muscles are rigid with decreased motion. Lumbar motion significant decreased flexion mildly decreased rotation lateral flexion extension. Lower extremity strength is intact distally.    Lab and Radiology Results  EXAM: MRI LUMBAR SPINE WITHOUT CONTRAST  TECHNIQUE: Multiplanar, multisequence MR imaging of the lumbar spine was performed. No intravenous contrast was administered.  COMPARISON:  Lumbar spine radiograph May 07, 2018 and MRI  lumbar spine January 07, 2007  FINDINGS: SEGMENTATION: For the purposes of this report, the last well-formed intervertebral disc is reported as L5-S1.  ALIGNMENT: Maintained lumbar lordosis. No malalignment.  VERTEBRAE:Vertebral bodies are intact. Minimal L1-2, L4-5 and L5-S1 disc height loss with multilevel mild disc desiccation. Multilevel mild chronic discogenic endplate changes and old Schmorl's nodes. Acute L1 and L4 inferior endplate Schmorl's nodes.  CONUS MEDULLARIS AND CAUDA EQUINA: Conus medullaris terminates at L1 and demonstrates normal morphology and signal characteristics. Cauda equina is normal.  PARASPINAL AND OTHER SOFT TISSUES: Included prevertebral and paraspinal soft tissues are normal.  DISC LEVELS:  T12-L1: No disc bulge, canal stenosis nor neural foraminal narrowing.  L1-2: Small broad-based disc bulge asymmetric to the LEFT. No canal stenosis or neural foraminal narrowing.  L2-3: No disc bulge, canal stenosis nor neural foraminal narrowing. Mild RIGHT facet arthropathy.  L3-4: Annular bulging. Mild facet arthropathy and ligamentum flavum redundancy without canal stenosis or neural foraminal narrowing.  L4-5: Small broad-based disc bulge. Annular fissure. Interval reabsorption of disc extrusion with minimal LEFT ventral epidural granulation tissue. Minimal facet arthropathy and ligamentum flavum redundancy without canal stenosis. Mild bilateral neural foraminal narrowing.  L5-S1: Small LEFT central disc protrusion and annular fissure. Encroachment upon the traversing LEFT S1 nerve. Mild facet arthropathy. No canal stenosis. Minimal neural foraminal narrowing.  IMPRESSION: 1. Degenerative change of the lumbar spine. Multiple acute Schmorl's nodes. 2. No canal stenosis. Encroachment upon the traversing LEFT S1 nerve. 3. Minimal to mild L4-5  and L5-S1 neural foraminal narrowing.   Electronically Signed   By: Awilda Metro  M.D.   On: 07/11/2018 23:17 I, Clementeen Graham, personally (independently) visualized and performed the interpretation of the images attached in this note.    Assessment and Plan: 64 y.o. male with low back pain.  Patient has acute exacerbation of his chronic low back pain.  He has significant degenerative changes seen on MRI from about 2 years ago.  Current pain is mostly due to lumbosacral strain and spasm.  He will benefit from physical therapy that was previously ordered.  Additionally will try using baclofen heating pad and TENS unit.  Recheck back with myself or Dr. Katrinka Blazing in about a month.  Return sooner if needed.  Additionally discussed chronic low back pain.  Patient had some temporary relief with previous facet injections.  Discussed that we may proceed with repeat injection or potentially even proceeding to medial branch ablation if his pain is not sufficiently controlled once we get the lumbar sacral spasm controlled.  Toradol 60 and Depo-Medrol 80 injections IM given today prior to discharge.  PDMP not reviewed this encounter. No orders of the defined types were placed in this encounter.  Meds ordered this encounter  Medications  . baclofen (LIORESAL) 10 MG tablet    Sig: Take 1 tablet (10 mg total) by mouth 3 (three) times daily as needed for muscle spasms.    Dispense:  60 each    Refill:  1  . ketorolac (TORADOL) injection 60 mg  . methylPREDNISolone acetate (DEPO-MEDROL) injection 80 mg     Discussed warning signs or symptoms. Please see discharge instructions. Patient expresses understanding.   The above documentation has been reviewed and is accurate and complete Clementeen Graham   Total encounter time 30 minutes including charting time date of service. Discussed plan for current pain and as well as plan for chronic pain

## 2019-07-15 NOTE — Patient Instructions (Addendum)
Thank you for coming in today. Attend PT Use heating pad and TENS.   Recheck with me or Dr Katrinka Blazing in about 3-4 weeks.    TENS UNIT: This is helpful for muscle pain and spasm.   Search and Purchase a TENS 7000 2nd edition at  www.tenspros.com or www.Amazon.com It should be less than $30.     TENS unit instructions: Do not shower or bathe with the unit on Turn the unit off before removing electrodes or batteries If the electrodes lose stickiness add a drop of water to the electrodes after they are disconnected from the unit and place on plastic sheet. If you continued to have difficulty, call the TENS unit company to purchase more electrodes. Do not apply lotion on the skin area prior to use. Make sure the skin is clean and dry as this will help prolong the life of the electrodes. After use, always check skin for unusual red areas, rash or other skin difficulties. If there are any skin problems, does not apply electrodes to the same area. Never remove the electrodes from the unit by pulling the wires. Do not use the TENS unit or electrodes other than as directed. Do not change electrode placement without consultating your therapist or physician. Keep 2 fingers with between each electrode. Wear time ratio is 2:1, on to off times.    For example on for 30 minutes off for 15 minutes and then on for 30 minutes off for 15 minutes

## 2019-08-02 NOTE — Telephone Encounter (Signed)
Left a detailed voicemail about hospital available time on 09-20-2019. Asked him to call and let us know either way what his decision was.

## 2019-08-10 ENCOUNTER — Ambulatory Visit: Payer: BC Managed Care – PPO | Admitting: Family Medicine

## 2019-08-10 DIAGNOSIS — Z0289 Encounter for other administrative examinations: Secondary | ICD-10-CM
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
## 2019-08-24 ENCOUNTER — Ambulatory Visit: Payer: BC Managed Care – PPO | Admitting: Family Medicine

## 2019-08-30 ENCOUNTER — Other Ambulatory Visit: Payer: Self-pay | Admitting: Family

## 2019-08-30 DIAGNOSIS — M5442 Lumbago with sciatica, left side: Secondary | ICD-10-CM

## 2019-08-30 DIAGNOSIS — G8929 Other chronic pain: Secondary | ICD-10-CM

## 2019-09-14 ENCOUNTER — Ambulatory Visit: Payer: BC Managed Care – PPO | Admitting: Podiatry

## 2019-09-21 ENCOUNTER — Ambulatory Visit (INDEPENDENT_AMBULATORY_CARE_PROVIDER_SITE_OTHER): Payer: BC Managed Care – PPO | Admitting: Family

## 2019-09-21 ENCOUNTER — Ambulatory Visit (INDEPENDENT_AMBULATORY_CARE_PROVIDER_SITE_OTHER): Payer: BC Managed Care – PPO | Admitting: Podiatry

## 2019-09-21 ENCOUNTER — Other Ambulatory Visit: Payer: Self-pay

## 2019-09-21 ENCOUNTER — Other Ambulatory Visit: Payer: Self-pay | Admitting: Family

## 2019-09-21 ENCOUNTER — Encounter: Payer: Self-pay | Admitting: Podiatry

## 2019-09-21 ENCOUNTER — Encounter: Payer: Self-pay | Admitting: Family

## 2019-09-21 VITALS — Temp 97.6°F

## 2019-09-21 VITALS — BP 120/86 | HR 96 | Temp 98.0°F | Ht 71.0 in | Wt 152.8 lb

## 2019-09-21 DIAGNOSIS — M549 Dorsalgia, unspecified: Secondary | ICD-10-CM

## 2019-09-21 DIAGNOSIS — E559 Vitamin D deficiency, unspecified: Secondary | ICD-10-CM

## 2019-09-21 DIAGNOSIS — L600 Ingrowing nail: Secondary | ICD-10-CM | POA: Diagnosis not present

## 2019-09-21 DIAGNOSIS — L84 Corns and callosities: Secondary | ICD-10-CM | POA: Diagnosis not present

## 2019-09-21 DIAGNOSIS — M2041 Other hammer toe(s) (acquired), right foot: Secondary | ICD-10-CM | POA: Diagnosis not present

## 2019-09-21 DIAGNOSIS — Z113 Encounter for screening for infections with a predominantly sexual mode of transmission: Secondary | ICD-10-CM

## 2019-09-21 LAB — VITAMIN D 25 HYDROXY (VIT D DEFICIENCY, FRACTURES): VITD: 17.13 ng/mL — ABNORMAL LOW (ref 30.00–100.00)

## 2019-09-21 MED ORDER — BACLOFEN 10 MG PO TABS: 10.0000 mg | ORAL_TABLET | Freq: Three times a day (TID) | ORAL | 1 refills | Status: DC | PRN

## 2019-09-21 MED ORDER — TRIAMCINOLONE ACETONIDE 0.1 % EX CREA: 1.0000 "application " | TOPICAL_CREAM | Freq: Two times a day (BID) | CUTANEOUS | 0 refills | Status: DC

## 2019-09-21 MED ORDER — IBUPROFEN 800 MG PO TABS: 800.0000 mg | ORAL_TABLET | Freq: Three times a day (TID) | ORAL | 1 refills | Status: DC | PRN

## 2019-09-21 MED ORDER — FAMOTIDINE 20 MG PO TABS: 20.0000 mg | ORAL_TABLET | Freq: Two times a day (BID) | ORAL | 1 refills | Status: DC

## 2019-09-21 NOTE — Progress Notes (Signed)
Subjective:   Patient ID: [name], male   DOB: 64 y.o.   MRN: [id number]   HPI Patient presents with a lesion at the end of his third toe right and chronic nail disease 1-5 both feet that he is tried to take care of himself over the years but it is becoming increasingly hard he cannot cut them to get ingrown into the corners and he knows he needs to have them removed someday   ROS      Objective:  Physical Exam  Neurovascular status was found to be intact with patient found to have a keratotic lesion distal third digit right over left foot that is painful when pressed and chronic nail disease 1-5 both feet that are thick dystrophic and hard for him to cut himself     Assessment:  Keratotic lesion secondary to pressure with mycotic nail infection 1-5 both feet that is been chronic in nature     Plan:  H&P conditions reviewed and I discussed the nails and I do think given the long-term chronic nature of these of failure to respond to previous medicines that removal would be indicated and I discussed permanent procedures.  He wants to get this done but needs to hold off currently and will be seen back for this and today I debrided the lesions and we will see the results of this and applied padding to the toe
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
## 2019-09-22 LAB — RPR: RPR Ser Ql: NONREACTIVE

## 2019-09-23 ENCOUNTER — Other Ambulatory Visit: Payer: Self-pay | Admitting: Family

## 2019-09-23 MED ORDER — VITAMIN D (ERGOCALCIFEROL) 1.25 MG (50000 UNIT) PO CAPS: 50000.0000 [IU] | ORAL_CAPSULE | ORAL | 0 refills | Status: DC

## 2019-10-14 ENCOUNTER — Ambulatory Visit (INDEPENDENT_AMBULATORY_CARE_PROVIDER_SITE_OTHER): Payer: BC Managed Care – PPO

## 2019-10-14 ENCOUNTER — Ambulatory Visit (INDEPENDENT_AMBULATORY_CARE_PROVIDER_SITE_OTHER): Payer: BC Managed Care – PPO | Admitting: Podiatry

## 2019-10-14 ENCOUNTER — Other Ambulatory Visit: Payer: Self-pay

## 2019-10-14 ENCOUNTER — Other Ambulatory Visit: Payer: Self-pay | Admitting: Podiatry

## 2019-10-14 ENCOUNTER — Encounter: Payer: Self-pay | Admitting: Podiatry

## 2019-10-14 VITALS — Temp 97.3°F

## 2019-10-14 DIAGNOSIS — L600 Ingrowing nail: Secondary | ICD-10-CM | POA: Diagnosis not present

## 2019-10-14 DIAGNOSIS — M722 Plantar fascial fibromatosis: Secondary | ICD-10-CM

## 2019-10-14 DIAGNOSIS — M79671 Pain in right foot: Secondary | ICD-10-CM

## 2019-10-14 MED ORDER — NEOMYCIN-POLYMYXIN-HC 3.5-10000-1 OT SOLN
OTIC | 0 refills | Status: DC
Start: 2019-10-14 — End: 2021-03-07

## 2019-10-14 NOTE — Patient Instructions (Signed)

## 2019-10-16 NOTE — Progress Notes (Signed)
Subjective:   Patient ID: [name], male   DOB: 64 y.o.   MRN: [id number]   HPI Patient presents stating I need all of my nails taken off them to focus on the right 1 first and then I will do the left one next.  States that they become very tender and hard to wear shoe gear with and also his feet hurt in general   ROS      Objective:  Physical Exam  Neurovascular status intact with thick yellow incurvated brittle nails 1-5 both feet that he cannot cut ingrown's in the corner and even sure by professionals does not relieve discomfort.  Moderate flattening of the arch bilateral     Assessment:  Chronic nail disease 1-5 both feet with pain along with chronic foot structural pain bilateral     Plan:  H&P discussed both conditions x-rays done and today I recommended nail removal.  We are going to do the right foot first and I anesthetized the right digits 1 through 5 with approximate 300 mg of Xylocaine Marcaine mixture.  Sterile prep applied to all nailbeds.  I then went ahead and did compression of all nailbeds and digits and exposed the toes and the nails and then removed nails 1-3 4 and 5 on the right foot.  I then individually exposed and applied chemical consisting of phenol followed by alcohol 5 applications to the hallux for application to adjacent digits applied alcohol afterwards for lavage and I then went ahead and applied sterile dressings.  Instructed leaving it dressings on for 24 hours but take them off earlier if any drainage were to occur or if any pain were to occur and patient is encouraged to keep the foot elevated.  X-rays indicate moderate spurring but no indication stress fracture or advanced arthritis condition

## 2019-11-02 ENCOUNTER — Other Ambulatory Visit: Payer: Self-pay | Admitting: Family

## 2019-11-11 ENCOUNTER — Ambulatory Visit: Payer: BC Managed Care – PPO | Admitting: Podiatry

## 2019-11-14 ENCOUNTER — Other Ambulatory Visit: Payer: Self-pay | Admitting: Family

## 2019-11-17 NOTE — Telephone Encounter (Signed)
Left a voicemail with details about the next hospital date of 01-23-2020.

## 2019-11-21 ENCOUNTER — Other Ambulatory Visit: Payer: Self-pay

## 2019-11-21 ENCOUNTER — Encounter: Payer: Self-pay | Admitting: Podiatry

## 2019-11-21 ENCOUNTER — Ambulatory Visit (INDEPENDENT_AMBULATORY_CARE_PROVIDER_SITE_OTHER): Payer: BC Managed Care – PPO | Admitting: Podiatry

## 2019-11-21 DIAGNOSIS — L600 Ingrowing nail: Secondary | ICD-10-CM

## 2019-11-21 NOTE — Patient Instructions (Signed)

## 2019-11-21 NOTE — Progress Notes (Signed)
Subjective:   Patient ID: [name], male   DOB: 64 y.o.   MRN: [id number]   HPI Patient states the nails on my right are doing really well and they need to get the nails on my left removed due to pain and deformity   ROS      Objective:  Physical Exam  Neurovascular status intact with nail site right crusted over doing well with the left hallux second third and fourth nails are incurvated thick and painful     Assessment:  Doing well post nail care right with chronic nail disease with pain of 4 nails left     Plan:  H&P reviewed both conditions and recommended removal of the nails left.  Explained procedure risk and patient wants surgery and today I infiltrated with 180 mg like Marcaine mixture sterile preps applied all 4 nails and using sterile instrumentation remove the foreign nails and exposed root and applied phenol 5 applications to the hallux for application to the other nails phenol 30 seconds followed by alcohol lavage sterile dressings of all toes.  Gave instructions on soaks and reappoint and instructed on leaving dressings on 24 hours but take them off earlier if any other kind of pathology were to occur

## 2019-11-24 NOTE — Telephone Encounter (Signed)
Pt is requesting a call back to schedule his colonoscopy at this hospital, pt is requesting another day besides 01/23/2020

## 2019-11-25 NOTE — Telephone Encounter (Signed)
I spoke to [name]. He cannot do the date offered (01-23-2020). Advised it could be Oct or Nov for next available date. Pt asked we reach out to him when those dates are available.

## 2019-12-06 ENCOUNTER — Other Ambulatory Visit: Payer: Self-pay | Admitting: Family

## 2019-12-21 ENCOUNTER — Other Ambulatory Visit: Payer: Self-pay | Admitting: Family

## 2020-02-23 ENCOUNTER — Other Ambulatory Visit: Payer: Self-pay | Admitting: Family

## 2020-02-29 ENCOUNTER — Other Ambulatory Visit: Payer: Self-pay | Admitting: Internal Medicine

## 2020-04-13 ENCOUNTER — Other Ambulatory Visit: Payer: Self-pay | Admitting: Family

## 2020-04-23 ENCOUNTER — Other Ambulatory Visit: Payer: Self-pay | Admitting: Family

## 2020-05-26 ENCOUNTER — Other Ambulatory Visit: Payer: Self-pay | Admitting: Family

## 2020-06-20 ENCOUNTER — Other Ambulatory Visit: Payer: Self-pay | Admitting: Family

## 2020-07-08 ENCOUNTER — Other Ambulatory Visit: Payer: Self-pay | Admitting: Family

## 2020-07-09 ENCOUNTER — Other Ambulatory Visit: Payer: Self-pay | Admitting: Family

## 2020-07-09 MED ORDER — SILDENAFIL CITRATE 100 MG PO TABS: 50.0000 mg | ORAL_TABLET | Freq: Every day | ORAL | 1 refills | Status: DC | PRN

## 2020-08-02 ENCOUNTER — Other Ambulatory Visit: Payer: Self-pay | Admitting: Family

## 2020-08-20 ENCOUNTER — Other Ambulatory Visit: Payer: Self-pay | Admitting: Family

## 2020-08-23 ENCOUNTER — Other Ambulatory Visit: Payer: Self-pay | Admitting: Family

## 2020-08-24 ENCOUNTER — Other Ambulatory Visit: Payer: Self-pay | Admitting: Family

## 2020-09-03 ENCOUNTER — Other Ambulatory Visit: Payer: Self-pay | Admitting: Family

## 2020-09-12 ENCOUNTER — Other Ambulatory Visit: Payer: Self-pay | Admitting: Family

## 2020-10-04 ENCOUNTER — Other Ambulatory Visit: Payer: Self-pay | Admitting: Family

## 2020-10-04 NOTE — Telephone Encounter (Signed)
I have called the pt and relayed the providers message via Voice mail.   He is to find a new provider at [facility].

## 2020-10-04 NOTE — Telephone Encounter (Signed)
Please let him know that he needs to get established with new PCP. I last saw him in 09/2019. He can see whoever is taking on new patients at [facility].

## 2020-10-29 ENCOUNTER — Other Ambulatory Visit: Payer: Self-pay | Admitting: Family

## 2020-10-30 ENCOUNTER — Telehealth: Payer: Self-pay | Admitting: Family

## 2020-10-30 ENCOUNTER — Other Ambulatory Visit: Payer: Self-pay | Admitting: Family

## 2020-10-30 NOTE — Telephone Encounter (Signed)
I have called pt to relay message below, no answer so I left a message to call back.

## 2020-10-30 NOTE — Telephone Encounter (Signed)
What is his plan for continuation of care? He has not been seen since 09/2019. This is last refill for him.

## 2020-10-31 ENCOUNTER — Other Ambulatory Visit: Payer: Self-pay | Admitting: Family

## 2020-10-31 NOTE — Telephone Encounter (Signed)
Patient states he doesn't have health insurance currently and is looking for some coverage , but  he will get on medicaid in January but still wants to see Vernona Rieger as PCP

## 2020-10-31 NOTE — Telephone Encounter (Signed)
I have attempted to call pt and there was no answer so I left a message to call back.  

## 2020-10-31 NOTE — Telephone Encounter (Signed)
Attempted to call pt to see if he was coming to [facility] with [facility]ger or going to stay at [facility]. Left a couple of messages and will try again once more.

## 2020-11-01 NOTE — Telephone Encounter (Signed)
Mailbox is full.  Closing message out since we have left 2 messages already.

## 2020-11-04 ENCOUNTER — Other Ambulatory Visit: Payer: Self-pay | Admitting: Family

## 2020-11-14 ENCOUNTER — Telehealth: Payer: Self-pay

## 2020-11-14 NOTE — Telephone Encounter (Signed)
Called pt to reschedule screening colonoscopy at [facility] for August with available providers (Dr. Orvan Falconer and Dr. Tomasa Rand). Unable to LVM d/t VM being full.

## 2020-11-23 ENCOUNTER — Other Ambulatory Visit: Payer: Self-pay | Admitting: Family

## 2020-12-12 ENCOUNTER — Other Ambulatory Visit: Payer: Self-pay | Admitting: Family

## 2021-01-31 ENCOUNTER — Encounter (HOSPITAL_COMMUNITY): Payer: Self-pay | Admitting: Emergency Medicine

## 2021-01-31 ENCOUNTER — Emergency Department (HOSPITAL_COMMUNITY)
Admission: EM | Admit: 2021-01-31 | Discharge: 2021-01-31 | Discharge status: Against Medical Advice | Payer: Self-pay | Attending: Emergency Medicine | Admitting: Emergency Medicine

## 2021-01-31 ENCOUNTER — Emergency Department (HOSPITAL_COMMUNITY): Payer: Self-pay

## 2021-01-31 DIAGNOSIS — R110 Nausea: Secondary | ICD-10-CM | POA: Insufficient documentation

## 2021-01-31 DIAGNOSIS — R1011 Right upper quadrant pain: Secondary | ICD-10-CM | POA: Insufficient documentation

## 2021-01-31 DIAGNOSIS — Z5321 Procedure and treatment not carried out due to patient leaving prior to being seen by health care provider: Secondary | ICD-10-CM | POA: Insufficient documentation

## 2021-01-31 LAB — CBC WITH DIFFERENTIAL/PLATELET
Abs Immature Granulocytes: 0.04 10*3/uL (ref 0.00–0.07)
Basophils Absolute: 0 10*3/uL (ref 0.0–0.1)
Basophils Relative: 1 %
Eosinophils Absolute: 0.2 10*3/uL (ref 0.0–0.5)
Eosinophils Relative: 3 %
HCT: 45.1 % (ref 39.0–52.0)
Hemoglobin: 15.4 g/dL (ref 13.0–17.0)
Immature Granulocytes: 1 %
Lymphocytes Relative: 17 %
Lymphs Abs: 1.3 10*3/uL (ref 0.7–4.0)
MCH: 31.9 pg (ref 26.0–34.0)
MCHC: 34.1 g/dL (ref 30.0–36.0)
MCV: 93.4 fL (ref 80.0–100.0)
Monocytes Absolute: 0.8 10*3/uL (ref 0.1–1.0)
Monocytes Relative: 11 %
Neutro Abs: 5 10*3/uL (ref 1.7–7.7)
Neutrophils Relative %: 67 %
Platelets: 255 10*3/uL (ref 150–400)
RBC: 4.83 MIL/uL (ref 4.22–5.81)
RDW: 13.6 % (ref 11.5–15.5)
WBC: 7.3 10*3/uL (ref 4.0–10.5)
nRBC: 0 % (ref 0.0–0.2)

## 2021-01-31 LAB — COMPREHENSIVE METABOLIC PANEL
ALT: 36 U/L (ref 0–44)
AST: 29 U/L (ref 15–41)
Albumin: 3.8 g/dL (ref 3.5–5.0)
Alkaline Phosphatase: 56 U/L (ref 38–126)
Anion gap: 7 (ref 5–15)
BUN: 12 mg/dL (ref 8–23)
CO2: 27 mmol/L (ref 22–32)
Calcium: 8.8 mg/dL — ABNORMAL LOW (ref 8.9–10.3)
Chloride: 104 mmol/L (ref 98–111)
Creatinine, Ser: 0.87 mg/dL (ref 0.61–1.24)
GFR, Estimated: >60 mL/min (ref >60)
Glucose, Bld: 100 mg/dL — ABNORMAL HIGH (ref 70–99)
Potassium: 4.2 mmol/L (ref 3.5–5.1)
Sodium: 138 mmol/L (ref 135–145)
Total Bilirubin: 1.8 mg/dL — ABNORMAL HIGH (ref 0.3–1.2)
Total Protein: 6.1 g/dL — ABNORMAL LOW (ref 6.5–8.1)

## 2021-01-31 LAB — LIPASE, BLOOD: Lipase: 31 U/L (ref 11–51)

## 2021-01-31 NOTE — ED Notes (Signed)
Patient states he will go lay down and come back in the morning for his results. States pain is too severe to sit and wait.

## 2021-01-31 NOTE — ED Provider Notes (Signed)
Emergency Medicine Provider Triage Evaluation Note  [name] , a 65 y.o. male  was evaluated in triage.  Pt complains of intermittent right upper quadrant abdominal pain that began 2 days ago.  He states he was at work when he had a sharp cramping sensation in the right upper quadrant.  He reports associated nausea but denies any vomiting, fever, chills, urinary complaints, diarrhea, constipation, chest pain, and shortness of breath.  No other better or worse with food.  Is not improved with ibuprofen.  Review of Systems  Positive:  Negative: See above   Physical Exam  BP (!) 142/91   Pulse 64   Temp 98.1 F (36.7 C) (Oral)   Resp 16   Ht 5\' 11"  (1.803 m)   Wt 72.6 kg   SpO2 99%   BMI 22.32 kg/m  Gen:   Awake, no distress   Resp:  Normal effort  MSK:   Moves extremities without difficulty  Other:  Mild RUQ tenderness   Medical Decision Making  Medically screening exam initiated at 3:41 PM.  Appropriate orders placed.  DEONDREA MARKOS was informed that the remainder of the evaluation will be completed by another provider, this initial triage assessment does not replace that evaluation, and the importance of remaining in the ED until their evaluation is complete.     [name] [place], PA-C 01/31/21 1543    02/02/21, MD 02/02/21 [phone]

## 2021-01-31 NOTE — ED Triage Notes (Signed)
Pt endorses RUQ shooting pain for 2 days. Some nausea this morning. Last BM today.

## 2021-02-01 ENCOUNTER — Emergency Department (HOSPITAL_COMMUNITY)
Admission: EM | Admit: 2021-02-01 | Discharge: 2021-02-01 | Disposition: A | Discharge status: Home / Self Care | Payer: Self-pay | Attending: Emergency Medicine | Admitting: Emergency Medicine

## 2021-02-01 ENCOUNTER — Encounter (HOSPITAL_COMMUNITY): Payer: Self-pay

## 2021-02-01 ENCOUNTER — Ambulatory Visit (HOSPITAL_COMMUNITY): Admission: EM | Admit: 2021-02-01 | Discharge: 2021-02-01 | Payer: Self-pay

## 2021-02-01 ENCOUNTER — Other Ambulatory Visit: Payer: Self-pay

## 2021-02-01 ENCOUNTER — Emergency Department (HOSPITAL_COMMUNITY): Payer: Self-pay

## 2021-02-01 DIAGNOSIS — Z87891 Personal history of nicotine dependence: Secondary | ICD-10-CM | POA: Insufficient documentation

## 2021-02-01 DIAGNOSIS — R1011 Right upper quadrant pain: Secondary | ICD-10-CM

## 2021-02-01 DIAGNOSIS — K5730 Diverticulosis of large intestine without perforation or abscess without bleeding: Secondary | ICD-10-CM | POA: Insufficient documentation

## 2021-02-01 DIAGNOSIS — R17 Unspecified jaundice: Secondary | ICD-10-CM | POA: Insufficient documentation

## 2021-02-01 LAB — COMPREHENSIVE METABOLIC PANEL
ALT: 30 U/L (ref 0–44)
AST: 27 U/L (ref 15–41)
Albumin: 3.5 g/dL (ref 3.5–5.0)
Alkaline Phosphatase: 53 U/L (ref 38–126)
Anion gap: 7 (ref 5–15)
BUN: 13 mg/dL (ref 8–23)
CO2: 26 mmol/L (ref 22–32)
Calcium: 8.9 mg/dL (ref 8.9–10.3)
Chloride: 105 mmol/L (ref 98–111)
Creatinine, Ser: 0.8 mg/dL (ref 0.61–1.24)
GFR, Estimated: >60 mL/min (ref >60)
Glucose, Bld: 87 mg/dL (ref 70–99)
Potassium: 4.7 mmol/L (ref 3.5–5.1)
Sodium: 138 mmol/L (ref 135–145)
Total Bilirubin: 1.7 mg/dL — ABNORMAL HIGH (ref 0.3–1.2)
Total Protein: 5.8 g/dL — ABNORMAL LOW (ref 6.5–8.1)

## 2021-02-01 LAB — CBC WITH DIFFERENTIAL/PLATELET
Abs Immature Granulocytes: 0.03 10*3/uL (ref 0.00–0.07)
Basophils Absolute: 0 10*3/uL (ref 0.0–0.1)
Basophils Relative: 0 %
Eosinophils Absolute: 0.2 10*3/uL (ref 0.0–0.5)
Eosinophils Relative: 2 %
HCT: 44.6 % (ref 39.0–52.0)
Hemoglobin: 15.3 g/dL (ref 13.0–17.0)
Immature Granulocytes: 0 %
Lymphocytes Relative: 14 %
Lymphs Abs: 1.3 10*3/uL (ref 0.7–4.0)
MCH: 31.9 pg (ref 26.0–34.0)
MCHC: 34.3 g/dL (ref 30.0–36.0)
MCV: 92.9 fL (ref 80.0–100.0)
Monocytes Absolute: 0.7 10*3/uL (ref 0.1–1.0)
Monocytes Relative: 8 %
Neutro Abs: 6.7 10*3/uL (ref 1.7–7.7)
Neutrophils Relative %: 76 %
Platelets: 232 10*3/uL (ref 150–400)
RBC: 4.8 MIL/uL (ref 4.22–5.81)
RDW: 13.5 % (ref 11.5–15.5)
WBC: 9 10*3/uL (ref 4.0–10.5)
nRBC: 0 % (ref 0.0–0.2)

## 2021-02-01 LAB — LIPASE, BLOOD: Lipase: 44 U/L (ref 11–51)

## 2021-02-01 MED ORDER — ONDANSETRON HCL 4 MG/2ML IJ SOLN
4.0000 mg | Freq: Once | INTRAMUSCULAR | Status: AC
  Administered 2021-02-01: 4 mg via INTRAVENOUS
  Filled 2021-02-01: qty 2

## 2021-02-01 MED ORDER — IOHEXOL 350 MG/ML SOLN
100.0000 mL | Freq: Once | INTRAVENOUS | Status: AC | PRN
  Administered 2021-02-01: 100 mL via INTRAVENOUS

## 2021-02-01 MED ORDER — LIDOCAINE 5 % EX PTCH: 1.0000 | MEDICATED_PATCH | CUTANEOUS | 0 refills | Status: DC

## 2021-02-01 MED ORDER — LIDOCAINE 5 % EX PTCH
1.0000 | MEDICATED_PATCH | CUTANEOUS | Status: DC
  Administered 2021-02-01: 1 via TRANSDERMAL
  Filled 2021-02-01: qty 1

## 2021-02-01 MED ORDER — MORPHINE SULFATE (PF) 4 MG/ML IV SOLN
4.0000 mg | Freq: Once | INTRAVENOUS | Status: AC
Start: 2021-02-01 — End: 2021-02-01
  Administered 2021-02-01: 4 mg via INTRAVENOUS
  Filled 2021-02-01: qty 1

## 2021-02-01 NOTE — Discharge Instructions (Addendum)
Follow-up with your primary care doctor in 1 week if symptoms not resolved.  Return to the ED with new or worsening symptoms including vomiting, diarrhea, fever, chest pain, or shortness of breath.  Take Tylenol and Motrin, alternating every 4 hours for pain.  Use topical lidocaine patch as prescribed.

## 2021-02-01 NOTE — ED Notes (Signed)
Patient is being discharged from the Urgent Care and sent to the Emergency Department via personal vehicle . Per Provider Erma Pinto, patient is in need of higher level of care due to RUQ abdominal pain. Patient is aware and verbalizes understanding of plan of care.   Vitals:   02/01/21 0940  BP: (!) 178/148  Pulse: 73  Resp: 17  Temp: 97.9 F (36.6 C)  SpO2: 97%

## 2021-02-01 NOTE — Discharge Instructions (Addendum)
Report to ED after leaving this office for further evaluation.
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
## 2021-02-01 NOTE — ED Triage Notes (Signed)
Pt here POV with c/o of right abdominal pain. Denies N/V. Sharp pain comes and goes. Imaging and blood work done yesterday.

## 2021-02-01 NOTE — ED Triage Notes (Signed)
Pt presents with ongoing RUQ abdominal pain X 3 days; pt believes it is his gall bladder.  Was seen in ED yesterday.

## 2021-02-01 NOTE — ED Provider Notes (Signed)
  Physical Exam  BP (!) 168/78 (BP Location: Left Arm)   Pulse 61   Temp 97.9 F (36.6 C) (Oral)   Resp 17   SpO2 97%   Physical Exam Vitals and nursing note reviewed.  Constitutional:      Appearance: He is well-developed.  HENT:     Head: Normocephalic and atraumatic.  Eyes:     Conjunctiva/sclera: Conjunctivae normal.  Cardiovascular:     Rate and Rhythm: Normal rate and regular rhythm.     Heart sounds: No murmur heard. Pulmonary:     Effort: Pulmonary effort is normal. No respiratory distress.     Breath sounds: Normal breath sounds.  Abdominal:     Palpations: Abdomen is soft.     Tenderness: There is abdominal tenderness in the right upper quadrant. There is no guarding or rebound. Negative signs include Murphy's sign.  Musculoskeletal:     Cervical back: Neck supple.  Skin:    General: Skin is warm and dry.  Neurological:     Mental Status: He is alert.    ED Course/Procedures   Clinical Course as of 02/01/21 1841  Caleen Essex Feb 01, 2021  772 65 year old male with complaint of right upper quadrant pain.  Patient was seen in this ED yesterday for same, had labs completed showing elevated bilirubin, ultrasound of the right upper quadrant was unremarkable, specifically no gallstones or gallbladder wall thickening.  Normal LFTs, normal white blood cell count. Patient is found to have right upper quadrant tenderness on exam today.  Will repeat labs to compare to yesterday's labs.  CT ordered in triage as recommended by urgent care. Care signed out at change of shift pending results. [LM]    Clinical Course User Index [LM] Jeannie Fend, PA-C    Procedures  MDM  Patient is stable and well-appearing on repeat exam.  He does have tenderness in the right upper quadrant but without rebound or guarding.  Negative Murphy sign.  Also has tenderness with palpation over the ribs in that area consistent with a most likely musculoskeletal pain.  Total bilirubin very mildly  elevated at 1.7 but stable from prior.  Labs including CBC, CMP and lipase were otherwise unremarkable.  CT scan without acute intra-abdominal pathology including biliary disease.  Right upper quadrant ultrasound performed yesterday without any evidence of gallstones or sludge, unlikely symptomatic cholelithiasis.  Patient is stable for discharge home.  Encouraged him to follow-up with his PCP if symptoms do not resolve.  Advised Tylenol and Motrin alternating every 4 hours for pain and prescribed a lidocaine patch.  Advised patient to rest and avoid physical activity in the next 5 to 7 days.  Discharged home in stable condition.         Doran Clay, MD 02/01/21 1842    Rozelle Logan, DO 02/01/21 2245

## 2021-02-01 NOTE — ED Provider Notes (Signed)
Emergency Medicine Provider Triage Evaluation Note  [name] , a 65 y.o. male  was evaluated in triage.  Pt complains of continued sharp pain to RUQ for the past week. Pt was seen here yesterday for same - medically screened; had labs done with a slightly elevated t bili at 1.8. RUQ Ultrasound normal. He left prior to being seen. HE went to UC today and was told to come back here for CT scan. NO nausea, vomiting, fevers, diarrhea.   Review of Systems  Positive: + RUQ pain Negative: - fevers, chills, nausea, vomiting  Physical Exam  BP (!) 171/81 (BP Location: Right Arm)   Pulse 69   Temp 97.8 F (36.6 C) (Oral)   Resp 16   SpO2 99%  Gen:   Awake, no distress   Resp:  Normal effort  MSK:   Moves extremities without difficulty  Other:  + RUQ TTP. No rash appreciated to skin.   Medical Decision Making  Medically screening exam initiated at 10:51 AM.  Appropriate orders placed.  DELFIN SQUILLACE was informed that the remainder of the evaluation will be completed by another provider, this initial triage assessment does not replace that evaluation, and the importance of remaining in the ED until their evaluation is complete.  Remainder of labs unremarkable yesterday. I do not feel he needs repeat labs. CT ordered. He denies pleuritic chest pain. No sob. Doubt PE.    Tanda Rockers, PA-C 02/01/21 1057    Arby Barrette, MD 02/20/21 [phone]

## 2021-02-05 ENCOUNTER — Encounter (HOSPITAL_COMMUNITY): Payer: Self-pay | Admitting: Emergency Medicine

## 2021-02-05 ENCOUNTER — Emergency Department (HOSPITAL_COMMUNITY)
Admission: EM | Admit: 2021-02-05 | Discharge: 2021-02-06 | Disposition: A | Discharge status: Home / Self Care | Payer: Self-pay | Attending: Emergency Medicine | Admitting: Emergency Medicine

## 2021-02-05 ENCOUNTER — Other Ambulatory Visit: Payer: Self-pay

## 2021-02-05 ENCOUNTER — Emergency Department (HOSPITAL_COMMUNITY): Payer: Self-pay

## 2021-02-05 DIAGNOSIS — R112 Nausea with vomiting, unspecified: Secondary | ICD-10-CM | POA: Insufficient documentation

## 2021-02-05 DIAGNOSIS — Z5321 Procedure and treatment not carried out due to patient leaving prior to being seen by health care provider: Secondary | ICD-10-CM | POA: Insufficient documentation

## 2021-02-05 DIAGNOSIS — R1013 Epigastric pain: Secondary | ICD-10-CM | POA: Insufficient documentation

## 2021-02-05 DIAGNOSIS — R079 Chest pain, unspecified: Secondary | ICD-10-CM

## 2021-02-05 DIAGNOSIS — R1011 Right upper quadrant pain: Secondary | ICD-10-CM | POA: Insufficient documentation

## 2021-02-05 LAB — URINALYSIS, ROUTINE W REFLEX MICROSCOPIC
Bilirubin Urine: NEGATIVE
Glucose, UA: NEGATIVE mg/dL
Hgb urine dipstick: NEGATIVE
Ketones, ur: 5 mg/dL — AB
Leukocytes,Ua: NEGATIVE
Nitrite: NEGATIVE
Protein, ur: NEGATIVE mg/dL
Specific Gravity, Urine: 1.008 (ref 1.005–1.030)
pH: 6 (ref 5.0–8.0)

## 2021-02-05 LAB — COMPREHENSIVE METABOLIC PANEL
ALT: 28 U/L (ref 0–44)
AST: 29 U/L (ref 15–41)
Albumin: 3.8 g/dL (ref 3.5–5.0)
Alkaline Phosphatase: 65 U/L (ref 38–126)
Anion gap: 7 (ref 5–15)
BUN: 14 mg/dL (ref 8–23)
CO2: 29 mmol/L (ref 22–32)
Calcium: 9.2 mg/dL (ref 8.9–10.3)
Chloride: 101 mmol/L (ref 98–111)
Creatinine, Ser: 0.93 mg/dL (ref 0.61–1.24)
GFR, Estimated: >60 mL/min (ref >60)
Glucose, Bld: 104 mg/dL — ABNORMAL HIGH (ref 70–99)
Potassium: 4.4 mmol/L (ref 3.5–5.1)
Sodium: 137 mmol/L (ref 135–145)
Total Bilirubin: 1.7 mg/dL — ABNORMAL HIGH (ref 0.3–1.2)
Total Protein: 6.5 g/dL (ref 6.5–8.1)

## 2021-02-05 LAB — CBC WITH DIFFERENTIAL/PLATELET
Abs Immature Granulocytes: 0.05 10*3/uL (ref 0.00–0.07)
Basophils Absolute: 0 10*3/uL (ref 0.0–0.1)
Basophils Relative: 1 %
Eosinophils Absolute: 0.2 10*3/uL (ref 0.0–0.5)
Eosinophils Relative: 2 %
HCT: 45.2 % (ref 39.0–52.0)
Hemoglobin: 15.3 g/dL (ref 13.0–17.0)
Immature Granulocytes: 1 %
Lymphocytes Relative: 13 %
Lymphs Abs: 1 10*3/uL (ref 0.7–4.0)
MCH: 31.8 pg (ref 26.0–34.0)
MCHC: 33.8 g/dL (ref 30.0–36.0)
MCV: 94 fL (ref 80.0–100.0)
Monocytes Absolute: 0.9 10*3/uL (ref 0.1–1.0)
Monocytes Relative: 11 %
Neutro Abs: 6 10*3/uL (ref 1.7–7.7)
Neutrophils Relative %: 72 %
Platelets: 265 10*3/uL (ref 150–400)
RBC: 4.81 MIL/uL (ref 4.22–5.81)
RDW: 13.6 % (ref 11.5–15.5)
WBC: 8.2 10*3/uL (ref 4.0–10.5)
nRBC: 0 % (ref 0.0–0.2)

## 2021-02-05 LAB — TROPONIN I (HIGH SENSITIVITY)
Troponin I (High Sensitivity): 30 ng/L — ABNORMAL HIGH (ref <18)
Troponin I (High Sensitivity): 29 ng/L — ABNORMAL HIGH (ref <18)

## 2021-02-05 LAB — LIPASE, BLOOD: Lipase: 22 U/L (ref 11–51)

## 2021-02-05 MED ORDER — SODIUM CHLORIDE 0.9 % IV BOLUS: 500.0000 mL | Freq: Once | INTRAVENOUS | Status: DC

## 2021-02-05 MED ORDER — PANTOPRAZOLE SODIUM 40 MG IV SOLR: 40.0000 mg | Freq: Once | INTRAVENOUS | Status: DC

## 2021-02-05 MED ORDER — ALUM & MAG HYDROXIDE-SIMETH 200-200-20 MG/5ML PO SUSP
30.0000 mL | Freq: Once | ORAL | Status: AC
  Administered 2021-02-05: 30 mL via ORAL
  Filled 2021-02-05: qty 30

## 2021-02-05 MED ORDER — LIDOCAINE VISCOUS HCL 2 % MT SOLN
15.0000 mL | Freq: Once | OROMUCOSAL | Status: AC
  Administered 2021-02-05: 15 mL via ORAL
  Filled 2021-02-05: qty 15

## 2021-02-05 NOTE — ED Provider Notes (Signed)
Emergency Medicine Provider Triage Evaluation Note  [name] , a 65 y.o. male  was evaluated in triage.   Patient reports intermittent epigastric and right upper quadrant abdominal pain for the past 1 week, no clear inciting event.  Patient reports he was seen here earlier this week, no clear cause was found after CT scan and ultrasound.  Patient reports pain worsening over the past 24 hours and he has had 1 episode of nonbloody/nonbilious emesis.  He reports that prior to his episode of emesis he had a to hot dog and had an energy drink and feels this may have exacerbated his symptoms.  Review of Systems  Positive: Abdominal pain, nausea, vomiting Negative: Fever, chills, fall, injury, chest pain/shortness of breath, cough/hemoptysis, dysuria/hematuria or any additional concerns.  Physical Exam  BP (!) 166/79 (BP Location: Left Arm)   Pulse 80   Temp 98.4 F (36.9 C) (Oral)   Resp 18   SpO2 99%  Gen:   Awake, no distress   Resp:  Normal effort  MSK:   Moves extremities without difficulty  Other:  RUQ and epigastric TTP.  Abdomen is soft, no peritoneal signs.  Medical Decision Making  Medically screening exam initiated at 2:56 PM.  Appropriate orders placed.  [name] was informed that the remainder of the evaluation will be completed by another provider, this initial triage assessment does not replace that evaluation, and the importance of remaining in the ED until their evaluation is complete.  Brief chart review shows patient was seen in ER 01/31/2021 for RUQ abdominal pain, ultrasound of the right upper quadrant was negative at that visit.  Patient then returned on 02/01/2021, CT abdomen pelvis with contrast at that visit showed no acute findings.  On my evaluation today patient remains tender in the right upper quadrant but no peritoneal signs.  Possibly GERD with right upper quadrant pain and epigastric pain worsening after eating hot dog and energy drink.  I have  ordered repeat labs including CBC, CMP, lipase and urinalysis.  I have considered additional causes of epigastric pain in a 65 year old male he has no chest pain or shortness of breath but will add on a troponin to ensure this not abnormal presentation of ACS.  EKG and chest x-ray also ordered.  I have ordered IV Protonix as well as a GI cocktail to help with symptoms.  Note: Portions of this report may have been transcribed using voice recognition software. Every effort was made to ensure accuracy; however, inadvertent computerized transcription errors may still be present.    Elizabeth Palau 02/05/21 1506    Wynetta Fines, MD 02/06/21 [phone]

## 2021-02-05 NOTE — ED Triage Notes (Signed)
Patient here with complaint of intermittent stabbing abdominal pain that started approximately one week ago. Reports pain became more frequent over the last week. Also complains of nausea and vomiting.

## 2021-02-05 NOTE — ED Notes (Signed)
Pt stated that he was leaving and coming back in the am

## 2021-02-07 ENCOUNTER — Other Ambulatory Visit: Payer: Self-pay

## 2021-02-07 ENCOUNTER — Emergency Department (HOSPITAL_COMMUNITY)
Admission: EM | Admit: 2021-02-07 | Discharge: 2021-02-08 | Disposition: A | Discharge status: Home / Self Care | Payer: Self-pay | Attending: Student | Admitting: Student

## 2021-02-07 ENCOUNTER — Encounter (HOSPITAL_COMMUNITY): Payer: Self-pay | Admitting: *Deleted

## 2021-02-07 DIAGNOSIS — Z5321 Procedure and treatment not carried out due to patient leaving prior to being seen by health care provider: Secondary | ICD-10-CM | POA: Insufficient documentation

## 2021-02-07 DIAGNOSIS — R109 Unspecified abdominal pain: Secondary | ICD-10-CM | POA: Insufficient documentation

## 2021-02-07 DIAGNOSIS — R21 Rash and other nonspecific skin eruption: Secondary | ICD-10-CM | POA: Insufficient documentation

## 2021-02-07 LAB — CBC WITH DIFFERENTIAL/PLATELET
Abs Immature Granulocytes: 0.06 10*3/uL (ref 0.00–0.07)
Basophils Absolute: 0.1 10*3/uL (ref 0.0–0.1)
Basophils Relative: 1 %
Eosinophils Absolute: 0.2 10*3/uL (ref 0.0–0.5)
Eosinophils Relative: 3 %
HCT: 47.5 % (ref 39.0–52.0)
Hemoglobin: 16.3 g/dL (ref 13.0–17.0)
Immature Granulocytes: 1 %
Lymphocytes Relative: 15 %
Lymphs Abs: 1.3 10*3/uL (ref 0.7–4.0)
MCH: 32.4 pg (ref 26.0–34.0)
MCHC: 34.3 g/dL (ref 30.0–36.0)
MCV: 94.4 fL (ref 80.0–100.0)
Monocytes Absolute: 1 10*3/uL (ref 0.1–1.0)
Monocytes Relative: 11 %
Neutro Abs: 6 10*3/uL (ref 1.7–7.7)
Neutrophils Relative %: 69 %
Platelets: 266 10*3/uL (ref 150–400)
RBC: 5.03 MIL/uL (ref 4.22–5.81)
RDW: 13.3 % (ref 11.5–15.5)
WBC: 8.6 10*3/uL (ref 4.0–10.5)
nRBC: 0 % (ref 0.0–0.2)

## 2021-02-07 LAB — COMPREHENSIVE METABOLIC PANEL
ALT: 26 U/L (ref 0–44)
AST: 24 U/L (ref 15–41)
Albumin: 4.1 g/dL (ref 3.5–5.0)
Alkaline Phosphatase: 66 U/L (ref 38–126)
Anion gap: 8 (ref 5–15)
BUN: 12 mg/dL (ref 8–23)
CO2: 26 mmol/L (ref 22–32)
Calcium: 9.2 mg/dL (ref 8.9–10.3)
Chloride: 103 mmol/L (ref 98–111)
Creatinine, Ser: 0.88 mg/dL (ref 0.61–1.24)
GFR, Estimated: >60 mL/min (ref >60)
Glucose, Bld: 90 mg/dL (ref 70–99)
Potassium: 4.3 mmol/L (ref 3.5–5.1)
Sodium: 137 mmol/L (ref 135–145)
Total Bilirubin: 1.9 mg/dL — ABNORMAL HIGH (ref 0.3–1.2)
Total Protein: 6.8 g/dL (ref 6.5–8.1)

## 2021-02-07 LAB — LIPASE, BLOOD: Lipase: 32 U/L (ref 11–51)

## 2021-02-07 MED ORDER — OXYCODONE-ACETAMINOPHEN 5-325 MG PO TABS
1.0000 | ORAL_TABLET | ORAL | Status: DC | PRN
  Administered 2021-02-07: 1 via ORAL
  Filled 2021-02-07: qty 1

## 2021-02-07 NOTE — ED Triage Notes (Signed)
Pt was seen here for worsening R side pain.  Rash noted.

## 2021-02-07 NOTE — ED Provider Notes (Signed)
Emergency Medicine Provider Triage Evaluation Note  [name] , a 65 y.o. male  was evaluated in triage.  Pt complains of right sided mid back/upper quadrant pain. Initially intermittent, now constant, worse with eating, movement, and several other things. No alleviating factors. Scratched it earlier because it was itchy. Seen in the ED for same. He is concerned this is his gallbladder. Vomited once the other day.   Review of Systems  Positive: Back pain, abdominal pain, vomiting.  Negative: Fever, hematemesis, melena, hematochezia.   Physical Exam  BP (!) 160/79   Pulse 77   Temp 97.8 F (36.6 C)   Resp 17   SpO2 99%  Gen:   Awake, no distress   Resp:  Normal effort  Other:   Mild RUQ abdominal tenderness and right thoracic paraspinal muscle tenderness. Patient does have degree of rash noted to this area as well.        Medical Decision Making  Medically screening exam initiated at 10:34 PM.  Appropriate orders placed.  MARBIN OLSHEFSKI was informed that the remainder of the evaluation will be completed by another provider, this initial triage assessment does not replace that evaluation, and the importance of remaining in the ED until their evaluation is complete.  Patient with RUQ/right thoracic back pain x 2 weeks Seen in ER 01/31/2021 for RUQ abdominal pain, ultrasound of the right upper quadrant was negative at that visit.  Patient then returned on 02/01/2021, CT abdomen pelvis with contrast at that visit showed no acute findings. Seen in the ED again 02/05/21- flat cardiac enzymes.   Patient has a degree of a rash to the area of pain, he states he was scratching here earlier, considering shingles as diagnoses, he remains concerned regarding his gallbladder- labs ordered.      Cherly Anderson, PA-C 02/07/21 2247    Melene Plan, DO 02/08/21 1615

## 2021-02-08 NOTE — ED Notes (Addendum)
Pt  left AMA Pt was explained the wait time

## 2021-03-07 ENCOUNTER — Encounter (HOSPITAL_COMMUNITY): Payer: Self-pay | Admitting: *Deleted

## 2021-03-07 ENCOUNTER — Emergency Department (HOSPITAL_COMMUNITY)
Admission: EM | Admit: 2021-03-07 | Discharge: 2021-03-07 | Disposition: A | Discharge status: Home / Self Care | Payer: Self-pay | Attending: Emergency Medicine | Admitting: Emergency Medicine

## 2021-03-07 ENCOUNTER — Emergency Department (HOSPITAL_COMMUNITY): Payer: Self-pay

## 2021-03-07 DIAGNOSIS — Z87891 Personal history of nicotine dependence: Secondary | ICD-10-CM | POA: Insufficient documentation

## 2021-03-07 DIAGNOSIS — R109 Unspecified abdominal pain: Secondary | ICD-10-CM | POA: Insufficient documentation

## 2021-03-07 DIAGNOSIS — R079 Chest pain, unspecified: Secondary | ICD-10-CM

## 2021-03-07 DIAGNOSIS — B0229 Other postherpetic nervous system involvement: Secondary | ICD-10-CM

## 2021-03-07 LAB — CBC
HCT: 42.4 % (ref 39.0–52.0)
Hemoglobin: 14.7 g/dL (ref 13.0–17.0)
MCH: 33.1 pg (ref 26.0–34.0)
MCHC: 34.7 g/dL (ref 30.0–36.0)
MCV: 95.5 fL (ref 80.0–100.0)
Platelets: 222 10*3/uL (ref 150–400)
RBC: 4.44 MIL/uL (ref 4.22–5.81)
RDW: 14.5 % (ref 11.5–15.5)
WBC: 10 10*3/uL (ref 4.0–10.5)
nRBC: 0 % (ref 0.0–0.2)

## 2021-03-07 LAB — D-DIMER, QUANTITATIVE: D-Dimer, Quant: <0.27 ug/mL-FEU (ref 0.00–0.50)

## 2021-03-07 LAB — BASIC METABOLIC PANEL
Anion gap: 8 (ref 5–15)
BUN: 15 mg/dL (ref 8–23)
CO2: 24 mmol/L (ref 22–32)
Calcium: 9.2 mg/dL (ref 8.9–10.3)
Chloride: 106 mmol/L (ref 98–111)
Creatinine, Ser: 0.65 mg/dL (ref 0.61–1.24)
GFR, Estimated: >60 mL/min (ref >60)
Glucose, Bld: 94 mg/dL (ref 70–99)
Potassium: 3.9 mmol/L (ref 3.5–5.1)
Sodium: 138 mmol/L (ref 135–145)

## 2021-03-07 LAB — HEPATIC FUNCTION PANEL
ALT: 33 U/L (ref 0–44)
AST: 28 U/L (ref 15–41)
Albumin: 4.2 g/dL (ref 3.5–5.0)
Alkaline Phosphatase: 64 U/L (ref 38–126)
Bilirubin, Direct: 0.1 mg/dL (ref 0.0–0.2)
Indirect Bilirubin: 0.6 mg/dL (ref 0.3–0.9)
Total Bilirubin: 0.7 mg/dL (ref 0.3–1.2)
Total Protein: 6.9 g/dL (ref 6.5–8.1)

## 2021-03-07 LAB — TROPONIN I (HIGH SENSITIVITY)
Troponin I (High Sensitivity): 35 ng/L — ABNORMAL HIGH (ref <18)
Troponin I (High Sensitivity): 35 ng/L — ABNORMAL HIGH (ref <18)

## 2021-03-07 MED ORDER — KETOROLAC TROMETHAMINE 60 MG/2ML IM SOLN
60.0000 mg | Freq: Once | INTRAMUSCULAR | Status: AC
  Administered 2021-03-07: 60 mg via INTRAMUSCULAR
  Filled 2021-03-07: qty 2

## 2021-03-07 MED ORDER — MELOXICAM 15 MG PO TABS: 15.0000 mg | ORAL_TABLET | Freq: Every day | ORAL | 0 refills | Status: DC

## 2021-03-07 NOTE — ED Triage Notes (Signed)
Pain in right rib cage for six weeks

## 2021-03-07 NOTE — ED Provider Notes (Signed)
Well-appearing 65 year old male presents approximately 1 month after developing a rash and tenderness to his right thoracic area, there are well-documented pictures in the media tab of the electronic medical record showing that the patient likely had shingles.  His symptoms have been very classic for that, his vital signs are minimally hypertensive, he is still having pain though the rash is resolved.  Work-up negative here.  Will give Toradol, sent home with anti-inflammatory and a short course of prednisone, patient agreeable.  Stable for discharge`  Medical screening examination/treatment/procedure(s) were conducted as a shared visit with non-physician practitioner(s) and myself.  I personally evaluated the patient during the encounter.  Clinical Impression:   Final diagnoses:  Right-sided chest pain         Eber Hong, MD 03/09/21 1424

## 2021-03-07 NOTE — Discharge Instructions (Addendum)
Follow up with your Physicain for rehceck  
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
## 2021-03-14 ENCOUNTER — Emergency Department (HOSPITAL_COMMUNITY)
Admission: EM | Admit: 2021-03-14 | Discharge: 2021-03-14 | Disposition: A | Discharge status: Home / Self Care | Payer: Self-pay | Attending: Emergency Medicine | Admitting: Emergency Medicine

## 2021-03-14 ENCOUNTER — Other Ambulatory Visit: Payer: Self-pay

## 2021-03-14 ENCOUNTER — Encounter (HOSPITAL_COMMUNITY): Payer: Self-pay

## 2021-03-14 DIAGNOSIS — Z87891 Personal history of nicotine dependence: Secondary | ICD-10-CM | POA: Insufficient documentation

## 2021-03-14 DIAGNOSIS — Z79899 Other long term (current) drug therapy: Secondary | ICD-10-CM | POA: Insufficient documentation

## 2021-03-14 DIAGNOSIS — F419 Anxiety disorder, unspecified: Secondary | ICD-10-CM

## 2021-03-14 DIAGNOSIS — I10 Essential (primary) hypertension: Secondary | ICD-10-CM

## 2021-03-14 LAB — CBC WITH DIFFERENTIAL/PLATELET
Abs Immature Granulocytes: 0.04 10*3/uL (ref 0.00–0.07)
Basophils Absolute: 0 10*3/uL (ref 0.0–0.1)
Basophils Relative: 1 %
Eosinophils Absolute: 0.2 10*3/uL (ref 0.0–0.5)
Eosinophils Relative: 3 %
HCT: 43 % (ref 39.0–52.0)
Hemoglobin: 14.9 g/dL (ref 13.0–17.0)
Immature Granulocytes: 1 %
Lymphocytes Relative: 18 %
Lymphs Abs: 1 10*3/uL (ref 0.7–4.0)
MCH: 33.2 pg (ref 26.0–34.0)
MCHC: 34.7 g/dL (ref 30.0–36.0)
MCV: 95.8 fL (ref 80.0–100.0)
Monocytes Absolute: 0.9 10*3/uL (ref 0.1–1.0)
Monocytes Relative: 16 %
Neutro Abs: 3.3 10*3/uL (ref 1.7–7.7)
Neutrophils Relative %: 61 %
Platelets: 275 10*3/uL (ref 150–400)
RBC: 4.49 MIL/uL (ref 4.22–5.81)
RDW: 14.4 % (ref 11.5–15.5)
WBC: 5.3 10*3/uL (ref 4.0–10.5)
nRBC: 0 % (ref 0.0–0.2)

## 2021-03-14 LAB — TROPONIN I (HIGH SENSITIVITY): Troponin I (High Sensitivity): 34 ng/L — ABNORMAL HIGH (ref <18)

## 2021-03-14 LAB — BASIC METABOLIC PANEL
Anion gap: 8 (ref 5–15)
BUN: 17 mg/dL (ref 8–23)
CO2: 28 mmol/L (ref 22–32)
Calcium: 9.1 mg/dL (ref 8.9–10.3)
Chloride: 105 mmol/L (ref 98–111)
Creatinine, Ser: 0.81 mg/dL (ref 0.61–1.24)
GFR, Estimated: >60 mL/min (ref >60)
Glucose, Bld: 94 mg/dL (ref 70–99)
Potassium: 4.3 mmol/L (ref 3.5–5.1)
Sodium: 141 mmol/L (ref 135–145)

## 2021-03-14 MED ORDER — HYDROCHLOROTHIAZIDE 25 MG PO TABS: 25.0000 mg | ORAL_TABLET | Freq: Every day | ORAL | 0 refills | Status: DC

## 2021-03-14 MED ORDER — LORAZEPAM 0.5 MG PO TABS: 0.5000 mg | ORAL_TABLET | Freq: Three times a day (TID) | ORAL | 0 refills | Status: DC | PRN
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
## 2021-03-14 NOTE — ED Triage Notes (Signed)
Pt presents to Ed with multiple complaints, pt says he feels like his heart is racing, pt says he thinks he may have anxiety, worries about everything, has a lot of stress right now, says he has checked his BP 8- 10 times today and it has been high and he is worried about that. Pt also reports he has shingles to right side. This nurse did deep breathing exercises with pt and pt did calm down a little.

## 2021-03-18 ENCOUNTER — Other Ambulatory Visit: Payer: Self-pay

## 2021-03-18 ENCOUNTER — Ambulatory Visit: Payer: Self-pay

## 2021-03-18 ENCOUNTER — Encounter (HOSPITAL_COMMUNITY): Payer: Self-pay | Admitting: *Deleted

## 2021-03-18 ENCOUNTER — Emergency Department (HOSPITAL_COMMUNITY)
Admission: EM | Admit: 2021-03-18 | Discharge: 2021-03-19 | Disposition: A | Discharge status: Home / Self Care | Payer: Self-pay | Attending: Emergency Medicine | Admitting: Emergency Medicine

## 2021-03-18 DIAGNOSIS — Z79899 Other long term (current) drug therapy: Secondary | ICD-10-CM | POA: Insufficient documentation

## 2021-03-18 DIAGNOSIS — F419 Anxiety disorder, unspecified: Secondary | ICD-10-CM | POA: Insufficient documentation

## 2021-03-18 DIAGNOSIS — I10 Essential (primary) hypertension: Secondary | ICD-10-CM | POA: Insufficient documentation

## 2021-03-18 DIAGNOSIS — Z87891 Personal history of nicotine dependence: Secondary | ICD-10-CM | POA: Insufficient documentation

## 2021-03-18 NOTE — Telephone Encounter (Signed)
Pt called in stating his BP is 205/92 and he just took it 10 mins ago at [facility] using the automatic cuff. He denies having any other symptoms except for feeling like his heart is racing. He reports being admitted 4 days ago to hospital for HTN and they started him on a new BP med. He has been taking the med every day since and its not lowering the BP. He also states that he has shingles. Per hospital note, they prescribed Ativan for anxiety and he has taken that as well but only gave 5 so he is already out of those. Advised pt since no PCP, need to go to ED to be seen. Pt states hes going to pay rent and take dog home and will head to [facility] ED. Care advice given and pt verbalized understanding. No other questions/concerns noted.    Reason for Disposition  [1] Systolic BP  >= 200 OR Diastolic >= 120 AND [2] having NO cardiac or neurologic symptoms  Answer Assessment - Initial Assessment Questions 1. BLOOD PRESSURE: "What is the blood pressure?" "Did you take at least two measurements 5 minutes apart?"     205/92 2. ONSET: "When did you take your blood pressure?"     10 mins ago  3. HOW: "How did you obtain the blood pressure?" (e.g., visiting nurse, automatic home BP monitor)     Automatic arm cuff at [facility] 4. HISTORY: "Do you have a history of high blood pressure?"     No 5. MEDICATIONS: "Are you taking any medications for blood pressure?" "Have you missed any doses recently?"     Started 4 days ago and gave new BP med 6. OTHER SYMPTOMS: "Do you have any symptoms?" (e.g., headache, chest pain, blurred vision, difficulty breathing, weakness)     no 7. PREGNANCY: "Is there any chance you are pregnant?" "When was your last menstrual period?"     NA  Protocols used: Blood Pressure - High-A-AH

## 2021-03-18 NOTE — ED Triage Notes (Signed)
Pt states he has been taking BP meds for 4 days and today he took his BP and it was 205/80; pt denies any sob, chest pain or headache currently; but states he did have a headache earlier today; pt was diagnosed with shingles last week

## 2021-03-19 MED ORDER — LISINOPRIL 20 MG PO TABS: 20.0000 mg | ORAL_TABLET | Freq: Every day | ORAL | 0 refills | Status: DC
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
## 2021-03-19 NOTE — Discharge Instructions (Signed)
You were seen today for ongoing elevated blood pressures.  Keep a log of your blood pressures at home.  You only need to seek treatment if you are having significant symptoms such as chest pain or headache or neurologic symptoms.  Add lisinopril to your regimen.  Establish primary care as planned.  You will need repeat blood pressure check and likely titration of your medication.

## 2021-04-09 ENCOUNTER — Telehealth: Payer: Self-pay

## 2021-04-09 NOTE — Telephone Encounter (Signed)
Appt scheduled

## 2021-04-09 NOTE — Telephone Encounter (Signed)
Patient is calling in stating he has a new patient appt in January and that he has hypertension, recently in the ED. In the ED he was given a 30 day supply of medication and they wont refill it, so patient wants to know if Alyssa would be willing to work him in sooner for an appointment so he doesn't have to go back to the ED for refills.

## 2021-04-12 ENCOUNTER — Encounter: Payer: Self-pay | Admitting: Physician Assistant

## 2021-04-12 ENCOUNTER — Ambulatory Visit (INDEPENDENT_AMBULATORY_CARE_PROVIDER_SITE_OTHER): Payer: Self-pay | Admitting: Physician Assistant

## 2021-04-12 ENCOUNTER — Other Ambulatory Visit: Payer: Self-pay

## 2021-04-12 VITALS — BP 124/69 | HR 69 | Temp 98.2°F | Ht 69.0 in | Wt 164.2 lb

## 2021-04-12 DIAGNOSIS — I10 Essential (primary) hypertension: Secondary | ICD-10-CM

## 2021-04-12 MED ORDER — HYDROCHLOROTHIAZIDE 12.5 MG PO TABS: 12.5000 mg | ORAL_TABLET | Freq: Every day | ORAL | 1 refills | Status: DC

## 2021-04-12 MED ORDER — LISINOPRIL 20 MG PO TABS: 20.0000 mg | ORAL_TABLET | Freq: Every day | ORAL | 1 refills | Status: DC
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
## 2021-05-13 ENCOUNTER — Other Ambulatory Visit: Payer: Self-pay

## 2021-05-13 ENCOUNTER — Ambulatory Visit (INDEPENDENT_AMBULATORY_CARE_PROVIDER_SITE_OTHER): Payer: Medicare Other | Admitting: Physician Assistant

## 2021-05-13 VITALS — BP 142/73 | HR 67 | Temp 98.2°F | Ht 69.0 in | Wt 164.2 lb

## 2021-05-13 DIAGNOSIS — M5442 Lumbago with sciatica, left side: Secondary | ICD-10-CM

## 2021-05-13 DIAGNOSIS — Z1211 Encounter for screening for malignant neoplasm of colon: Secondary | ICD-10-CM

## 2021-05-13 DIAGNOSIS — G8929 Other chronic pain: Secondary | ICD-10-CM | POA: Diagnosis not present

## 2021-05-13 DIAGNOSIS — I10 Essential (primary) hypertension: Secondary | ICD-10-CM

## 2021-05-13 MED ORDER — NAPROXEN SODIUM 550 MG PO TABS: ORAL_TABLET | ORAL | 2 refills | Status: DC

## 2021-05-13 MED ORDER — BACLOFEN 10 MG PO TABS: ORAL_TABLET | ORAL | 0 refills | Status: DC

## 2021-05-13 MED ORDER — LISINOPRIL 20 MG PO TABS: 20.0000 mg | ORAL_TABLET | Freq: Every day | ORAL | 1 refills | Status: DC
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
## 2021-05-13 NOTE — Patient Instructions (Addendum)
STOP the HCTZ, CONTINUE the Lisinopril - HOPEFULLY, your blood pressure stays normal (goal around 120-30/80), and your dizziness stops  Referral for Spine and Scoliosis   Referral for colonoscopy

## 2021-05-24 ENCOUNTER — Telehealth (INDEPENDENT_AMBULATORY_CARE_PROVIDER_SITE_OTHER): Payer: Medicare Other | Admitting: Physician Assistant

## 2021-05-24 ENCOUNTER — Encounter: Payer: Self-pay | Admitting: Physician Assistant

## 2021-05-24 VITALS — Ht 69.0 in | Wt 165.0 lb

## 2021-05-24 DIAGNOSIS — N528 Other male erectile dysfunction: Secondary | ICD-10-CM | POA: Diagnosis not present

## 2021-05-24 MED ORDER — SILDENAFIL CITRATE 100 MG PO TABS: 100.0000 mg | ORAL_TABLET | Freq: Every day | ORAL | 2 refills | Status: DC | PRN

## 2021-05-24 NOTE — Progress Notes (Signed)
° °  Virtual Visit via Video Note  I connected with  [name]  on 05/24/21 at 10:00 AM EST by a video enabled telemedicine application and verified that I am speaking with the correct person using two identifiers.  Location: Patient: home Provider: Therapist, music at [place] present: Patient and myself   I discussed the limitations of evaluation and management by telemedicine and the availability of in person appointments. The patient expressed understanding and agreed to proceed.   History of Present Illness:  Pleasant 66 year old male presents for virtual visit today to discuss erectile dysfunction.  He states that he has a prescription from previous provider for Sildenafil 100 mg from March 2022.  He says that he initially broke that dose into half or quarters and tried to take 25 to 50 mg at a time prior to intercourse.  He says that this medication was helpful for him and he would like to renew prescription.  He is taking lisinopril 20 mg for high blood pressure, otherwise no cardiac history.  He does not take any nitrates.  He is aware of potential risks with this medication.   Observations/Objective: BP 137/75  Gen: Awake, alert, no acute distress Resp: Breathing is even and non-labored Psych: calm/pleasant demeanor Neuro: Alert and Oriented x 3, + facial symmetry, speech is clear.   Assessment and Plan:  1. Other male erectile dysfunction -Patient previously did well with sildenafil 100 mg tablets.  I renewed this prescription for him today. -He does not have any major cardiac risk and he does not take any nitrates. -Patient is aware of possible side effects and he is also aware of risk versus benefit. -He will take this medication only as directed (1/2 to 1 tab one hour prior to sexual activity) and he will call if any problems.   Follow Up Instructions:    I discussed the assessment and treatment plan with the patient. The patient was  provided an opportunity to ask questions and all were answered. The patient agreed with the plan and demonstrated an understanding of the instructions.   The patient was advised to call back or seek an in-person evaluation if the symptoms worsen or if the condition fails to improve as anticipated.  Clela Hagadorn M Undrea Shipes, PA-C

## 2021-05-29 ENCOUNTER — Telehealth: Payer: Self-pay | Admitting: Physician Assistant

## 2021-05-29 NOTE — Telephone Encounter (Signed)
Pt states his refills were not ordered. He called [facility] on Monday and they were still not there.  sildenafil (VIAGRA) 100 MG tablet  [id number] - [place] [place] - [address]

## 2021-05-30 NOTE — Telephone Encounter (Signed)
Pt states his refills still have not been called in. He was seen on 05/25/19 through MyChart video. He is upset and would like someone to look into this please.

## 2021-06-01 NOTE — Telephone Encounter (Signed)
LVM to patient Rx was send to [facility] on  05/24/2021 Please call your pharmacy if any question give [place] a call

## 2021-06-03 ENCOUNTER — Other Ambulatory Visit: Payer: Self-pay

## 2021-06-03 DIAGNOSIS — Z1211 Encounter for screening for malignant neoplasm of colon: Secondary | ICD-10-CM

## 2021-06-03 NOTE — Telephone Encounter (Signed)
Patent calling to schedule hospital colonoscopy

## 2021-06-03 NOTE — Telephone Encounter (Signed)
Pt has been scheduled @ [facility] on 08/01/21 @ 1030a, arrival time 9am. PV also scheduled for 07/15/21 @ 1pm. Appt info has been mailed to pt home address.

## 2021-06-07 ENCOUNTER — Other Ambulatory Visit: Payer: Self-pay | Admitting: Physician Assistant

## 2021-06-24 ENCOUNTER — Other Ambulatory Visit: Payer: Self-pay | Admitting: Physician Assistant

## 2021-07-15 ENCOUNTER — Other Ambulatory Visit: Payer: Self-pay

## 2021-07-15 ENCOUNTER — Ambulatory Visit (AMBULATORY_SURGERY_CENTER): Payer: Medicare Other | Admitting: *Deleted

## 2021-07-15 VITALS — Ht 71.0 in | Wt 165.0 lb

## 2021-07-15 DIAGNOSIS — Z1211 Encounter for screening for malignant neoplasm of colon: Secondary | ICD-10-CM

## 2021-07-15 MED ORDER — PEG 3350-KCL-NA BICARB-NACL 420 G PO SOLR: 4000.0000 mL | Freq: Once | ORAL | 0 refills | Status: AC

## 2021-07-15 NOTE — Progress Notes (Signed)
No egg or soy allergy known to patient  ?Pt has a hx of difficult airway, procedure scheduled at [facility] ? ?No FH of Malignant Hyperthermia ?Pt is not on diet pills ?Pt is not on  home 02  ?Pt is not on blood thinners  ?Pt denies issues with constipation  ?No A fib or A flutter ? ?Pt is fully vaccinated  for Covid  ?  ? ?Due to the COVID-19 pandemic we are asking patients to follow certain guidelines in PV and the [facility]   ?Pt aware of COVID protocols and [facility] guidelines  ? ?PV completed over the phone. Pt verified name, DOB, address and insurance during PV today.  ?Pt mailed instruction packet with copy of consent form to read and not return, and instructions.  ?Pt encouraged to call with questions or issues.  ?If pt has My chart, procedure instructions sent via My Chart   ?

## 2021-07-18 ENCOUNTER — Encounter: Payer: Self-pay | Admitting: Physician Assistant

## 2021-07-18 ENCOUNTER — Ambulatory Visit (INDEPENDENT_AMBULATORY_CARE_PROVIDER_SITE_OTHER): Payer: Medicare Other | Admitting: Physician Assistant

## 2021-07-18 VITALS — BP 135/75 | HR 65 | Temp 98.4°F | Ht 71.0 in | Wt 164.0 lb

## 2021-07-18 DIAGNOSIS — K219 Gastro-esophageal reflux disease without esophagitis: Secondary | ICD-10-CM | POA: Diagnosis not present

## 2021-07-18 MED ORDER — PANTOPRAZOLE SODIUM 40 MG PO TBEC: 40.0000 mg | DELAYED_RELEASE_TABLET | Freq: Every day | ORAL | 1 refills | Status: DC

## 2021-07-18 NOTE — Patient Instructions (Addendum)
I think you are having constipation and bloating from the Tums. Please stop using these medications. ? ?Start on Protonix 40 mg for 4-8 weeks to help reduce acid. ? ?See handout for tips to reduce acid reflux as well.  ? ? ?
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
## 2021-07-22 ENCOUNTER — Encounter: Payer: Self-pay | Admitting: Internal Medicine

## 2021-07-24 ENCOUNTER — Encounter (HOSPITAL_COMMUNITY): Payer: Self-pay | Admitting: Internal Medicine

## 2021-07-24 NOTE — Progress Notes (Signed)
Attempted to obtain medical history via telephone, unable to reach at this time. I left a voicemail to return pre surgical testing department's phone call.  

## 2021-07-30 ENCOUNTER — Telehealth: Payer: Self-pay | Admitting: Internal Medicine

## 2021-07-30 NOTE — Telephone Encounter (Signed)
Please refer to previous telephone encounters. It seems we have been struggling to schedule this pt screening colon since 2020. Do you want him added to your wait list or how would you prefer to proceed? ?

## 2021-07-30 NOTE — Telephone Encounter (Signed)
Patient called to cancel his admission ENDO at [facility] 08/01/21. Per patient, in pain from shoulder injury.  ?

## 2021-07-30 NOTE — Telephone Encounter (Signed)
Pt added to July wait list. Will schedule once provider schedule is available. ?

## 2021-08-01 ENCOUNTER — Ambulatory Visit (HOSPITAL_COMMUNITY): Admit: 2021-08-01 | Payer: Medicare Other | Admitting: Internal Medicine

## 2021-08-01 ENCOUNTER — Ambulatory Visit (INDEPENDENT_AMBULATORY_CARE_PROVIDER_SITE_OTHER): Payer: Medicare Other | Admitting: Physician Assistant

## 2021-08-01 VITALS — BP 146/76 | HR 67 | Temp 98.3°F | Ht 71.0 in | Wt 167.0 lb

## 2021-08-01 DIAGNOSIS — M25511 Pain in right shoulder: Secondary | ICD-10-CM | POA: Diagnosis not present

## 2021-08-01 DIAGNOSIS — M62838 Other muscle spasm: Secondary | ICD-10-CM | POA: Diagnosis not present

## 2021-08-01 SURGERY — COLONOSCOPY WITH PROPOFOL
Anesthesia: Monitor Anesthesia Care

## 2021-08-01 MED ORDER — VALACYCLOVIR HCL 1 G PO TABS: 1000.0000 mg | ORAL_TABLET | Freq: Three times a day (TID) | ORAL | 0 refills | Status: DC

## 2021-08-01 MED ORDER — METHYLPREDNISOLONE 4 MG PO TBPK: ORAL_TABLET | ORAL | 0 refills | Status: DC
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
## 2021-08-01 NOTE — Patient Instructions (Addendum)
?  Muscle spasm / strain R shoulder versus early Shingles pain? ?OK to treat for both and be proactive  ?Take the medrol dose pak and Valtrex as directed ?Do NOT take NSAIDS (ibuprofen, Aleve, Advil, Naproxen, etc) with the steroids ?Rest, ice, gentle massage ?Call or MyChart with updates on how you're doing ?

## 2021-08-09 ENCOUNTER — Other Ambulatory Visit: Payer: Self-pay | Admitting: Physician Assistant

## 2021-08-15 ENCOUNTER — Ambulatory Visit (INDEPENDENT_AMBULATORY_CARE_PROVIDER_SITE_OTHER): Payer: Medicare Other | Admitting: Physician Assistant

## 2021-08-15 VITALS — BP 147/77 | HR 76 | Temp 98.7°F | Ht 71.0 in | Wt 163.4 lb

## 2021-08-15 DIAGNOSIS — M25511 Pain in right shoulder: Secondary | ICD-10-CM | POA: Diagnosis not present

## 2021-08-15 DIAGNOSIS — F411 Generalized anxiety disorder: Secondary | ICD-10-CM | POA: Diagnosis not present

## 2021-08-15 DIAGNOSIS — Z13220 Encounter for screening for lipoid disorders: Secondary | ICD-10-CM

## 2021-08-15 DIAGNOSIS — K219 Gastro-esophageal reflux disease without esophagitis: Secondary | ICD-10-CM

## 2021-08-15 DIAGNOSIS — F1010 Alcohol abuse, uncomplicated: Secondary | ICD-10-CM | POA: Diagnosis not present

## 2021-08-15 LAB — CBC WITH DIFFERENTIAL/PLATELET
Basophils Absolute: 0 10*3/uL (ref 0.0–0.1)
Basophils Relative: 0.4 % (ref 0.0–3.0)
Eosinophils Absolute: 0.2 10*3/uL (ref 0.0–0.7)
Eosinophils Relative: 2.5 % (ref 0.0–5.0)
HCT: 44 % (ref 39.0–52.0)
Hemoglobin: 15.3 g/dL (ref 13.0–17.0)
Lymphocytes Relative: 14.3 % (ref 12.0–46.0)
Lymphs Abs: 0.9 10*3/uL (ref 0.7–4.0)
MCHC: 34.8 g/dL (ref 30.0–36.0)
MCV: 98.3 fl (ref 78.0–100.0)
Monocytes Absolute: 0.8 10*3/uL (ref 0.1–1.0)
Monocytes Relative: 13.4 % — ABNORMAL HIGH (ref 3.0–12.0)
Neutro Abs: 4.2 10*3/uL (ref 1.4–7.7)
Neutrophils Relative %: 69.4 % (ref 43.0–77.0)
Platelets: 255 10*3/uL (ref 150.0–400.0)
RBC: 4.48 Mil/uL (ref 4.22–5.81)
RDW: 13.5 % (ref 11.5–15.5)
WBC: 6.1 10*3/uL (ref 4.0–10.5)

## 2021-08-15 LAB — COMPREHENSIVE METABOLIC PANEL
ALT: 26 U/L (ref 0–53)
AST: 24 U/L (ref 0–37)
Albumin: 4.7 g/dL (ref 3.5–5.2)
Alkaline Phosphatase: 52 U/L (ref 39–117)
BUN: 15 mg/dL (ref 6–23)
CO2: 29 mEq/L (ref 19–32)
Calcium: 9.3 mg/dL (ref 8.4–10.5)
Chloride: 102 mEq/L (ref 96–112)
Creatinine, Ser: 0.78 mg/dL (ref 0.40–1.50)
GFR: 93.76 mL/min (ref >60.00)
Glucose, Bld: 104 mg/dL — ABNORMAL HIGH (ref 70–99)
Potassium: 5.6 mEq/L — ABNORMAL HIGH (ref 3.5–5.1)
Sodium: 136 mEq/L (ref 135–145)
Total Bilirubin: 0.9 mg/dL (ref 0.2–1.2)
Total Protein: 6.8 g/dL (ref 6.0–8.3)

## 2021-08-15 LAB — LIPID PANEL
Cholesterol: 233 mg/dL — ABNORMAL HIGH (ref 0–200)
HDL: 60.9 mg/dL (ref >39.00)
LDL Cholesterol: 157 mg/dL — ABNORMAL HIGH (ref 0–99)
NonHDL: 171.86
Total CHOL/HDL Ratio: 4
Triglycerides: 73 mg/dL (ref 0.0–149.0)
VLDL: 14.6 mg/dL (ref 0.0–40.0)

## 2021-08-15 LAB — B12 AND FOLATE PANEL
Folate: 17.2 ng/mL (ref >5.9)
Vitamin B-12: 780 pg/mL (ref 211–911)

## 2021-08-15 LAB — TSH: TSH: 0.75 u[IU]/mL (ref 0.35–5.50)

## 2021-08-15 NOTE — Patient Instructions (Signed)
Ray, ?I'm so glad you came to see me today. ?Let's check some labs. ? ?Most important is to look at programs to stop alcohol consumption - detox. You can do this. I know you can. You did it once before and you can do it again. ? ?See attached sheet & let's go from there.  ? ?If you develop suicidal thoughts, please tell someone and immediately proceed to our local 24/7 crisis center, [facility].[address] [place], [place] [phone]. ? ?
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
## 2021-08-20 ENCOUNTER — Other Ambulatory Visit: Payer: Self-pay

## 2021-08-20 DIAGNOSIS — E875 Hyperkalemia: Secondary | ICD-10-CM

## 2021-08-21 ENCOUNTER — Other Ambulatory Visit (INDEPENDENT_AMBULATORY_CARE_PROVIDER_SITE_OTHER): Payer: Medicare Other

## 2021-08-21 DIAGNOSIS — E875 Hyperkalemia: Secondary | ICD-10-CM | POA: Diagnosis not present

## 2021-08-21 LAB — POTASSIUM: Potassium: 4.8 mEq/L (ref 3.5–5.1)
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
## 2021-08-22 ENCOUNTER — Ambulatory Visit: Payer: Self-pay

## 2021-08-22 ENCOUNTER — Ambulatory Visit (INDEPENDENT_AMBULATORY_CARE_PROVIDER_SITE_OTHER): Payer: Medicare Other | Admitting: Family Medicine

## 2021-08-22 VITALS — BP 124/66 | HR 70 | Ht 71.0 in | Wt 164.0 lb

## 2021-08-22 DIAGNOSIS — M25511 Pain in right shoulder: Secondary | ICD-10-CM | POA: Diagnosis not present

## 2021-08-22 NOTE — Patient Instructions (Signed)
Voltaren gel ?Exercises ?Ice ?See me in 2 months ? ?

## 2021-08-23 DIAGNOSIS — M25511 Pain in right shoulder: Secondary | ICD-10-CM | POA: Insufficient documentation

## 2021-08-23 LAB — VITAMIN B1: Vitamin B1 (Thiamine): <6 nmol/L — ABNORMAL LOW (ref 8–30)

## 2021-08-23 NOTE — Assessment & Plan Note (Signed)
Injection given today, discussed HEP discussed icing regimen patient does have anti-inflammatories if needed. ?We will see how patient responds and follow-up again in 6 to 8 weeks. ?

## 2021-08-26 ENCOUNTER — Other Ambulatory Visit: Payer: Self-pay | Admitting: Physician Assistant

## 2021-09-12 ENCOUNTER — Encounter: Payer: Self-pay | Admitting: Physician Assistant

## 2021-09-12 ENCOUNTER — Ambulatory Visit (INDEPENDENT_AMBULATORY_CARE_PROVIDER_SITE_OTHER): Payer: Medicare Other | Admitting: Physician Assistant

## 2021-09-12 VITALS — BP 140/70 | HR 67 | Temp 98.0°F | Ht 71.0 in | Wt 160.0 lb

## 2021-09-12 DIAGNOSIS — F1010 Alcohol abuse, uncomplicated: Secondary | ICD-10-CM | POA: Diagnosis not present

## 2021-09-12 DIAGNOSIS — F411 Generalized anxiety disorder: Secondary | ICD-10-CM | POA: Diagnosis not present

## 2021-09-12 DIAGNOSIS — K219 Gastro-esophageal reflux disease without esophagitis: Secondary | ICD-10-CM

## 2021-09-12 NOTE — Patient Instructions (Signed)
So proud of you!  ?Keep up great work. ?Monitor BP at home. ?Referral sent for [facility] counseling. ? ?See you back in 8 weeks, call sooner if any concerns. ? ?Keep sharing your light!!!  ?
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
## 2021-09-14 DIAGNOSIS — K219 Gastro-esophageal reflux disease without esophagitis: Secondary | ICD-10-CM | POA: Insufficient documentation

## 2021-09-14 DIAGNOSIS — F411 Generalized anxiety disorder: Secondary | ICD-10-CM | POA: Insufficient documentation

## 2021-09-14 DIAGNOSIS — F1010 Alcohol abuse, uncomplicated: Secondary | ICD-10-CM | POA: Insufficient documentation

## 2021-09-14 NOTE — Assessment & Plan Note (Signed)
Recent relapse as discussed at last visit.  Patient in a much better place today.  I would still recommend long-term additional help with this.  Referral to psychiatry for counseling placed today. ?

## 2021-09-14 NOTE — Assessment & Plan Note (Signed)
Patient states that this is a lot better recently.  Takes Protonix 40 mg when he needs it.  He will call for refills. ?

## 2021-09-14 NOTE — Assessment & Plan Note (Signed)
Patient requesting to see a counselor.  Referral placed today. ?

## 2021-10-16 ENCOUNTER — Encounter: Payer: Self-pay | Admitting: Internal Medicine

## 2021-10-16 NOTE — Progress Notes (Signed)
Patient cancelled his hospital colonoscopy procedure in 07/2021. I recommend that he be seen in clinic prior to scheduling another hospital procedure date for him since our hospital slots are limited. My scheduling team will be in touch with the patient about scheduling a clinic appt to discuss colon cancer screening.

## 2021-10-17 ENCOUNTER — Telehealth: Payer: Self-pay

## 2021-10-17 NOTE — Telephone Encounter (Signed)
See the documentation from Dr Leonides Schanz from 10/16/21. Patient needs to be seen in an office visit before scheduling the hospital colonoscopy. Called the patient to discuss. No answer. Left him a brief message about this. Asked he call back to ask any questions or to schedule the appointment.
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
## 2021-10-23 ENCOUNTER — Ambulatory Visit: Payer: Self-pay

## 2021-10-23 ENCOUNTER — Ambulatory Visit: Payer: Medicare Other | Admitting: Family Medicine

## 2021-10-23 ENCOUNTER — Encounter: Payer: Self-pay | Admitting: Family Medicine

## 2021-10-23 DIAGNOSIS — I10 Essential (primary) hypertension: Secondary | ICD-10-CM | POA: Insufficient documentation

## 2021-10-23 DIAGNOSIS — M25511 Pain in right shoulder: Secondary | ICD-10-CM | POA: Diagnosis not present

## 2021-10-23 DIAGNOSIS — K047 Periapical abscess without sinus: Secondary | ICD-10-CM | POA: Insufficient documentation

## 2021-10-23 NOTE — Assessment & Plan Note (Signed)
Patient is doing significantly better after the injection.  Discussed with patient about icing regimen and home exercises, discussed avoiding certain activities.  Patient will follow-up with me again in 3 months

## 2021-10-23 NOTE — Patient Instructions (Addendum)
[facility], [employer] or to Urgent Care or keep your appt tomorrow Shoulder looks better See me again in 3 months if still have trouble

## 2021-10-23 NOTE — Assessment & Plan Note (Signed)
Patient is concerned for potential tooth infection.  Patient does have poor dental hygiene but I do not see anything that truly looks like an infected etiology.  Encourage patient if worsening to see a urgent care.  Given names and numbers for emergency dental areas.  Patient does have an appointment with a dentist in 1 day otherwise.

## 2021-11-07 ENCOUNTER — Encounter: Payer: Self-pay | Admitting: Physician Assistant

## 2021-11-07 ENCOUNTER — Ambulatory Visit (INDEPENDENT_AMBULATORY_CARE_PROVIDER_SITE_OTHER): Payer: Medicare Other | Admitting: Physician Assistant

## 2021-11-07 VITALS — BP 130/74 | HR 75 | Temp 98.4°F | Ht 71.0 in | Wt 159.2 lb

## 2021-11-07 DIAGNOSIS — K219 Gastro-esophageal reflux disease without esophagitis: Secondary | ICD-10-CM

## 2021-11-07 DIAGNOSIS — F1010 Alcohol abuse, uncomplicated: Secondary | ICD-10-CM | POA: Diagnosis not present

## 2021-11-07 DIAGNOSIS — F411 Generalized anxiety disorder: Secondary | ICD-10-CM
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
## 2021-11-07 NOTE — Patient Instructions (Addendum)
[facility]- phone number is [phone] - call for counseling  Let me know how you're doing!  Keep working on positive thoughts.

## 2022-01-10 ENCOUNTER — Telehealth: Payer: Self-pay | Admitting: Physician Assistant

## 2022-01-10 ENCOUNTER — Other Ambulatory Visit: Payer: Self-pay

## 2022-01-10 MED ORDER — LISINOPRIL 20 MG PO TABS: 20.0000 mg | ORAL_TABLET | Freq: Every day | ORAL | 0 refills | Status: DC

## 2022-01-10 NOTE — Telephone Encounter (Signed)
Pt Rx for Lisinopril 20mg  x 7 days sent to pt pharmacy requested; called pt and advised, pt verbalized understanding

## 2022-01-10 NOTE — Telephone Encounter (Signed)
Please see msg and advise.  

## 2022-01-10 NOTE — Telephone Encounter (Signed)
Patient states: - [facility] informed him that a refill request for lisinopril was faxed to [place] and re-faxed on 09/08 morning.  - He is unsure if he has to pay for a 90 day supply out of pocket so he wants to wait until after pay day to call in this amount of the lisinopril   Patient requests: - A one week supply of medication be sent in on 09/08 so he doesn't run out of medication prior to his payday.  [facility] at [address]

## 2022-01-15 ENCOUNTER — Other Ambulatory Visit: Payer: Self-pay | Admitting: Physician Assistant
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
## 2022-01-22 ENCOUNTER — Ambulatory Visit: Payer: Medicare Other | Admitting: Family Medicine

## 2022-01-27 ENCOUNTER — Encounter: Payer: Self-pay | Admitting: *Deleted

## 2022-02-12 ENCOUNTER — Encounter: Payer: Medicare Other | Admitting: Physician Assistant

## 2022-02-19 ENCOUNTER — Encounter: Payer: Self-pay | Admitting: Physician Assistant

## 2022-02-19 ENCOUNTER — Ambulatory Visit (INDEPENDENT_AMBULATORY_CARE_PROVIDER_SITE_OTHER): Payer: Medicare Other | Admitting: Physician Assistant

## 2022-02-19 VITALS — BP 136/84 | HR 70 | Temp 97.8°F | Ht 71.0 in | Wt 160.6 lb

## 2022-02-19 DIAGNOSIS — Z0000 Encounter for general adult medical examination without abnormal findings: Secondary | ICD-10-CM | POA: Diagnosis not present

## 2022-02-19 DIAGNOSIS — Z125 Encounter for screening for malignant neoplasm of prostate: Secondary | ICD-10-CM | POA: Diagnosis not present

## 2022-02-19 DIAGNOSIS — K219 Gastro-esophageal reflux disease without esophagitis: Secondary | ICD-10-CM

## 2022-02-19 DIAGNOSIS — Z23 Encounter for immunization: Secondary | ICD-10-CM

## 2022-02-19 DIAGNOSIS — E782 Mixed hyperlipidemia: Secondary | ICD-10-CM

## 2022-02-19 DIAGNOSIS — F1010 Alcohol abuse, uncomplicated: Secondary | ICD-10-CM

## 2022-02-19 LAB — PSA: PSA: 3.05 ng/mL (ref 0.10–4.00)

## 2022-02-19 LAB — CBC WITH DIFFERENTIAL/PLATELET
Basophils Absolute: 0 10*3/uL (ref 0.0–0.1)
Basophils Relative: 0.4 % (ref 0.0–3.0)
Eosinophils Absolute: 0.2 10*3/uL (ref 0.0–0.7)
Eosinophils Relative: 2.9 % (ref 0.0–5.0)
HCT: 41.7 % (ref 39.0–52.0)
Hemoglobin: 14.5 g/dL (ref 13.0–17.0)
Lymphocytes Relative: 14.8 % (ref 12.0–46.0)
Lymphs Abs: 0.9 10*3/uL (ref 0.7–4.0)
MCHC: 34.7 g/dL (ref 30.0–36.0)
MCV: 98.7 fl (ref 78.0–100.0)
Monocytes Absolute: 0.8 10*3/uL (ref 0.1–1.0)
Monocytes Relative: 12.6 % — ABNORMAL HIGH (ref 3.0–12.0)
Neutro Abs: 4.2 10*3/uL (ref 1.4–7.7)
Neutrophils Relative %: 69.3 % (ref 43.0–77.0)
Platelets: 223 10*3/uL (ref 150.0–400.0)
RBC: 4.22 Mil/uL (ref 4.22–5.81)
RDW: 14.5 % (ref 11.5–15.5)
WBC: 6.1 10*3/uL (ref 4.0–10.5)

## 2022-02-19 LAB — COMPREHENSIVE METABOLIC PANEL
ALT: 29 U/L (ref 0–53)
AST: 34 U/L (ref 0–37)
Albumin: 4.3 g/dL (ref 3.5–5.2)
Alkaline Phosphatase: 48 U/L (ref 39–117)
BUN: 13 mg/dL (ref 6–23)
CO2: 27 mEq/L (ref 19–32)
Calcium: 9.2 mg/dL (ref 8.4–10.5)
Chloride: 100 mEq/L (ref 96–112)
Creatinine, Ser: 0.79 mg/dL (ref 0.40–1.50)
GFR: 93.06 mL/min (ref >60.00)
Glucose, Bld: 94 mg/dL (ref 70–99)
Potassium: 4.5 mEq/L (ref 3.5–5.1)
Sodium: 134 mEq/L — ABNORMAL LOW (ref 135–145)
Total Bilirubin: 0.9 mg/dL (ref 0.2–1.2)
Total Protein: 6.6 g/dL (ref 6.0–8.3)

## 2022-02-19 LAB — LIPID PANEL
Cholesterol: 236 mg/dL — ABNORMAL HIGH (ref 0–200)
HDL: 62.6 mg/dL (ref >39.00)
LDL Cholesterol: 145 mg/dL — ABNORMAL HIGH (ref 0–99)
NonHDL: 173.63
Total CHOL/HDL Ratio: 4
Triglycerides: 144 mg/dL (ref 0.0–149.0)
VLDL: 28.8 mg/dL (ref 0.0–40.0)

## 2022-02-19 LAB — LIPASE: Lipase: 58 U/L (ref 11.0–59.0)

## 2022-02-19 MED ORDER — PANTOPRAZOLE SODIUM 40 MG PO TBEC
40.0000 mg | DELAYED_RELEASE_TABLET | Freq: Every day | ORAL | 0 refills | Status: DC
Start: 2022-02-19 — End: 2022-07-15

## 2022-02-19 NOTE — Assessment & Plan Note (Signed)
Age-appropriate screening and counseling performed today. Will check labs and call with results. Preventive measures discussed and printed in AVS for patient.   Patient Counseling: [x]   Nutrition: Stressed importance of moderation in sodium/caffeine intake, saturated fat and cholesterol, caloric balance, sufficient intake of fresh fruits, vegetables, and fiber.  [x]   Stressed the importance of regular exercise.   [x]   Substance Abuse: Discussed cessation/primary prevention of tobacco, alcohol, or other drug use; driving or other dangerous activities under the influence; availability of treatment for abuse.   [x]   Injury prevention: Discussed safety belts, safety helmets, smoke detector, smoking near bedding or upholstery.   [x]   Sexuality: Discussed sexually transmitted diseases, partner selection, use of condoms, avoidance of unintended pregnancy  and contraceptive alternatives.   [x]   Dental health: Discussed importance of regular tooth brushing, flossing, and dental visits.  [x]   Health maintenance and immunizations reviewed. Please refer to Health maintenance section.
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
## 2022-02-19 NOTE — Assessment & Plan Note (Signed)
Need to check lipid panel today

## 2022-02-19 NOTE — Patient Instructions (Addendum)
Labs today, treat pending any abnormal results  Need to discontinue all NSAIDs (Aleve, aspirin, meloxicam, advil, ibuprofen, etc).  Referral sent to GI   Need to stop alcohol use   Flu shot today

## 2022-02-19 NOTE — Assessment & Plan Note (Signed)
Advised patient look into AA, psychiatry, substance abuse therapy.  He really needs to stop abusing alcohol.  Plan to recheck vitamin B1 today

## 2022-02-19 NOTE — Assessment & Plan Note (Signed)
Worsening Referral to GI - needs EGD and colonoscopy Protonix 40 mg daily Advised d/c NSAIDs and Alcohol

## 2022-02-21 ENCOUNTER — Other Ambulatory Visit: Payer: Self-pay

## 2022-02-21 MED ORDER — ATORVASTATIN CALCIUM 10 MG PO TABS: 10.0000 mg | ORAL_TABLET | Freq: Every day | ORAL | 0 refills | Status: DC
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
## 2022-02-27 ENCOUNTER — Ambulatory Visit: Payer: Self-pay

## 2022-02-27 ENCOUNTER — Ambulatory Visit (INDEPENDENT_AMBULATORY_CARE_PROVIDER_SITE_OTHER): Payer: Medicare Other

## 2022-02-27 ENCOUNTER — Ambulatory Visit: Payer: Medicare Other | Admitting: Family Medicine

## 2022-02-27 VITALS — BP 152/80 | HR 80 | Ht 71.0 in | Wt 163.0 lb

## 2022-02-27 DIAGNOSIS — G8929 Other chronic pain: Secondary | ICD-10-CM

## 2022-02-27 DIAGNOSIS — M5442 Lumbago with sciatica, left side: Secondary | ICD-10-CM | POA: Diagnosis not present

## 2022-02-27 DIAGNOSIS — M25511 Pain in right shoulder: Secondary | ICD-10-CM | POA: Diagnosis not present

## 2022-02-27 DIAGNOSIS — K219 Gastro-esophageal reflux disease without esophagitis: Secondary | ICD-10-CM

## 2022-02-27 NOTE — Patient Instructions (Addendum)
Xray today  [facility] [phone] Call Today  When we receive your results we will contact you via phone  We will consider Radiofrequency Ablation Injection in shoulder today

## 2022-02-28 ENCOUNTER — Encounter: Payer: Self-pay | Admitting: Family Medicine

## 2022-02-28 NOTE — Assessment & Plan Note (Signed)
Continues to have midline back pain but does have radiation.  Failed all conservative therapy at this time.  Has had injections in the past on his back that he did not respond to.  With patient having worsening pain and failing formal physical therapy as well as multiple medications including baclofen 10 mg up to 3 times a day and do feel that advanced imaging is warranted at this time.  Depending on findings we could see if patient is a candidate for medial branch block and radiofrequency ablation or possible nerve root or epidural injections.  Patient will get the MRI and we will evaluate afterwards and discuss further.

## 2022-02-28 NOTE — Assessment & Plan Note (Signed)
Social determinant of health this past medical history and discussed with patient avoiding anti-inflammatories.

## 2022-02-28 NOTE — Assessment & Plan Note (Signed)
Patient is continuing to have intermittent impingement type signs.  No significant weakness of the hip flexor noted.  Discussed icing regimen and home exercises.  Discussed which activities to do and which ones to avoid.  Increase activity slowly otherwise.  Keep hands within peripheral vision.  Follow-up again in 6 to 8 weeks

## 2022-03-03 ENCOUNTER — Telehealth: Payer: Self-pay | Admitting: Physician Assistant

## 2022-03-03 NOTE — Telephone Encounter (Signed)
Patient requests to have his labs redone. Patient states he did not fast for CPE/labs appointment on 02/19/22.  Patient requests to be called at ph# [phone] to be advised.

## 2022-03-04 NOTE — Telephone Encounter (Signed)
Please see note and advise if you would like me to put in future labs for this patient.

## 2022-03-04 NOTE — Telephone Encounter (Signed)
Returned patient call and advised provider recommendations. Patient advised he would rather wait 3-6 months and recheck lipid panel again. He is not comfortable with taking a Statin and the side effects it causes. Advised cholesterol friendly diet and exercise 30 mins daily to help as well. Pt verbalized understanding.

## 2022-03-12 ENCOUNTER — Encounter: Payer: Self-pay | Admitting: Family Medicine

## 2022-03-21 ENCOUNTER — Encounter: Payer: Self-pay | Admitting: Internal Medicine

## 2022-03-23 ENCOUNTER — Ambulatory Visit
Admission: RE | Admit: 2022-03-23 | Discharge: 2022-03-23 | Disposition: A | Discharge status: Home / Self Care | Payer: Medicare Other | Source: Ambulatory Visit | Attending: Family Medicine | Admitting: Family Medicine

## 2022-03-23 DIAGNOSIS — G8929 Other chronic pain: Secondary | ICD-10-CM

## 2022-04-10 ENCOUNTER — Other Ambulatory Visit: Payer: Self-pay

## 2022-04-10 ENCOUNTER — Telehealth: Payer: Self-pay | Admitting: Family Medicine

## 2022-04-10 DIAGNOSIS — M5416 Radiculopathy, lumbar region: Secondary | ICD-10-CM

## 2022-04-10 NOTE — Telephone Encounter (Signed)
Pt called for his MRI results-given. Apparently he does not use [employer] and his phone was not working last week.  Per pt, he would like to proceed with epidural order that Dr. Katrinka Blazing mentioned in his MRI results.

## 2022-04-10 NOTE — Telephone Encounter (Signed)
Ordered placed. Patient notified via voicemail.

## 2022-04-14 ENCOUNTER — Other Ambulatory Visit: Payer: Self-pay | Admitting: Physician Assistant

## 2022-04-15 ENCOUNTER — Other Ambulatory Visit: Payer: Self-pay | Admitting: Physician Assistant

## 2022-04-17 ENCOUNTER — Ambulatory Visit: Payer: Medicare Other | Admitting: Internal Medicine

## 2022-05-07 ENCOUNTER — Telehealth: Payer: Self-pay | Admitting: Physician Assistant
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
## 2022-05-13 ENCOUNTER — Ambulatory Visit (INDEPENDENT_AMBULATORY_CARE_PROVIDER_SITE_OTHER): Payer: Self-pay

## 2022-05-13 VITALS — Wt 163.0 lb

## 2022-05-13 DIAGNOSIS — Z0000 Encounter for general adult medical examination without abnormal findings: Secondary | ICD-10-CM

## 2022-05-13 NOTE — Patient Instructions (Signed)
[name] , Thank you for taking time to come for your Medicare Wellness Visit. I appreciate your ongoing commitment to your health goals. Please review the following plan we discussed and let me know if I can assist you in the future.   These are the goals we discussed:  Goals   None     This is a list of the screening recommended for you and due dates:  Health Maintenance  Topic Date Due   DTaP/Tdap/Td vaccine (1 - Tdap) Never done   COVID-19 Vaccine (4 - 2023-24 season) 01/03/2022   Zoster (Shingles) Vaccine (1 of 2) 05/22/2022*   Pneumonia Vaccine (1 - PCV) 11/08/2022*   Colon Cancer Screening  03/29/2029   Flu Shot  Completed   Hepatitis C Screening: USPSTF Recommendation to screen - Ages 18-79 yo.  Completed   HIV Screening  Completed   HPV Vaccine  Aged Out  *Topic was postponed. The date shown is not the original due date.    Advanced directives: Advance directive discussed with you today. Even though you declined this today please call our office should you change your mind and we can give you the proper paperwork for you to fill out.  Conditions/risks identified: none at this time   Next appointment: Follow up in one year for your annual wellness visit.   Preventive Care 21 Years and Older, Male  Preventive care refers to lifestyle choices and visits with your health care provider that can promote health and wellness. What does preventive care include? A yearly physical exam. This is also called an annual well check. Dental exams once or twice a year. Routine eye exams. Ask your health care provider how often you should have your eyes checked. Personal lifestyle choices, including: Daily care of your teeth and gums. Regular physical activity. Eating a healthy diet. Avoiding tobacco and drug use. Limiting alcohol use. Practicing safe sex. Taking low doses of aspirin every day. Taking vitamin and mineral supplements as recommended by your health care  provider. What happens during an annual well check? The services and screenings done by your health care provider during your annual well check will depend on your age, overall health, lifestyle risk factors, and family history of disease. Counseling  Your health care provider may ask you questions about your: Alcohol use. Tobacco use. Drug use. Emotional well-being. Home and relationship well-being. Sexual activity. Eating habits. History of falls. Memory and ability to understand (cognition). Work and work Statistician. Screening  You may have the following tests or measurements: Height, weight, and BMI. Blood pressure. Lipid and cholesterol levels. These may be checked every 5 years, or more frequently if you are over 47 years old. Skin check. Lung cancer screening. You may have this screening every year starting at age 12 if you have a 30-pack-year history of smoking and currently smoke or have quit within the past 15 years. Fecal occult blood test (FOBT) of the stool. You may have this test every year starting at age 65. Flexible sigmoidoscopy or colonoscopy. You may have a sigmoidoscopy every 5 years or a colonoscopy every 10 years starting at age 68. Prostate cancer screening. Recommendations will vary depending on your family history and other risks. Hepatitis C blood test. Hepatitis B blood test. Sexually transmitted disease (STD) testing. Diabetes screening. This is done by checking your blood sugar (glucose) after you have not eaten for a while (fasting). You may have this done every 1-3 years. Abdominal aortic aneurysm (AAA) screening. You may  need this if you are a current or former smoker. Osteoporosis. You may be screened starting at age 80 if you are at high risk. Talk with your health care provider about your test results, treatment options, and if necessary, the need for more tests. Vaccines  Your health care provider may recommend certain vaccines, such  as: Influenza vaccine. This is recommended every year. Tetanus, diphtheria, and acellular pertussis (Tdap, Td) vaccine. You may need a Td booster every 10 years. Zoster vaccine. You may need this after age 33. Pneumococcal 13-valent conjugate (PCV13) vaccine. One dose is recommended after age 15. Pneumococcal polysaccharide (PPSV23) vaccine. One dose is recommended after age 66. Talk to your health care provider about which screenings and vaccines you need and how often you need them. This information is not intended to replace advice given to you by your health care provider. Make sure you discuss any questions you have with your health care provider. Document Released: 05/18/2015 Document Revised: 01/09/2016 Document Reviewed: 02/20/2015 Elsevier Interactive Patient Education  2017 [place] Prevention in the Home Falls can cause injuries. They can happen to people of all ages. There are many things you can do to make your home safe and to help prevent falls. What can I do on the outside of my home? Regularly fix the edges of walkways and driveways and fix any cracks. Remove anything that might make you trip as you walk through a door, such as a raised step or threshold. Trim any bushes or trees on the path to your home. Use bright outdoor lighting. Clear any walking paths of anything that might make someone trip, such as rocks or tools. Regularly check to see if handrails are loose or broken. Make sure that both sides of any steps have handrails. Any raised decks and porches should have guardrails on the edges. Have any leaves, snow, or ice cleared regularly. Use sand or salt on walking paths during winter. Clean up any spills in your garage right away. This includes oil or grease spills. What can I do in the bathroom? Use night lights. Install grab bars by the toilet and in the tub and shower. Do not use towel bars as grab bars. Use non-skid mats or decals in the tub or  shower. If you need to sit down in the shower, use a plastic, non-slip stool. Keep the floor dry. Clean up any water that spills on the floor as soon as it happens. Remove soap buildup in the tub or shower regularly. Attach bath mats securely with double-sided non-slip rug tape. Do not have throw rugs and other things on the floor that can make you trip. What can I do in the bedroom? Use night lights. Make sure that you have a light by your bed that is easy to reach. Do not use any sheets or blankets that are too big for your bed. They should not hang down onto the floor. Have a firm chair that has side arms. You can use this for support while you get dressed. Do not have throw rugs and other things on the floor that can make you trip. What can I do in the kitchen? Clean up any spills right away. Avoid walking on wet floors. Keep items that you use a lot in easy-to-reach places. If you need to reach something above you, use a strong step stool that has a grab bar. Keep electrical cords out of the way. Do not use floor polish or wax that makes  floors slippery. If you must use wax, use non-skid floor wax. Do not have throw rugs and other things on the floor that can make you trip. What can I do with my stairs? Do not leave any items on the stairs. Make sure that there are handrails on both sides of the stairs and use them. Fix handrails that are broken or loose. Make sure that handrails are as long as the stairways. Check any carpeting to make sure that it is firmly attached to the stairs. Fix any carpet that is loose or worn. Avoid having throw rugs at the top or bottom of the stairs. If you do have throw rugs, attach them to the floor with carpet tape. Make sure that you have a light switch at the top of the stairs and the bottom of the stairs. If you do not have them, ask someone to add them for you. What else can I do to help prevent falls? Wear shoes that: Do not have high heels. Have  rubber bottoms. Are comfortable and fit you well. Are closed at the toe. Do not wear sandals. If you use a stepladder: Make sure that it is fully opened. Do not climb a closed stepladder. Make sure that both sides of the stepladder are locked into place. Ask someone to hold it for you, if possible. Clearly mark and make sure that you can see: Any grab bars or handrails. First and last steps. Where the edge of each step is. Use tools that help you move around (mobility aids) if they are needed. These include: Canes. Walkers. Scooters. Crutches. Turn on the lights when you go into a dark area. Replace any light bulbs as soon as they burn out. Set up your furniture so you have a clear path. Avoid moving your furniture around. If any of your floors are uneven, fix them. If there are any pets around you, be aware of where they are. Review your medicines with your doctor. Some medicines can make you feel dizzy. This can increase your chance of falling. Ask your doctor what other things that you can do to help prevent falls. This information is not intended to replace advice given to you by your health care provider. Make sure you discuss any questions you have with your health care provider. Document Released: 02/15/2009 Document Revised: 09/27/2015 Document Reviewed: 05/26/2014 Elsevier Interactive Patient Education  2017 [employer].
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
## 2022-05-23 ENCOUNTER — Ambulatory Visit: Payer: Medicare Other | Admitting: Internal Medicine

## 2022-07-15 ENCOUNTER — Ambulatory Visit (INDEPENDENT_AMBULATORY_CARE_PROVIDER_SITE_OTHER): Payer: Self-pay | Admitting: Internal Medicine

## 2022-07-15 ENCOUNTER — Encounter: Payer: Self-pay | Admitting: Internal Medicine

## 2022-07-15 VITALS — BP 120/80 | HR 84 | Ht 69.0 in | Wt 162.4 lb

## 2022-07-15 DIAGNOSIS — Z1211 Encounter for screening for malignant neoplasm of colon: Secondary | ICD-10-CM

## 2022-07-15 DIAGNOSIS — K219 Gastro-esophageal reflux disease without esophagitis: Secondary | ICD-10-CM

## 2022-07-15 MED ORDER — NA SULFATE-K SULFATE-MG SULF 17.5-3.13-1.6 GM/177ML PO SOLN: ORAL | 0 refills | Status: DC
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
## 2022-07-15 NOTE — Patient Instructions (Addendum)
You have been scheduled for an endoscopy and colonoscopy. Please follow the written instructions given to you at your visit today. Please pick up your prep supplies at the pharmacy within the next 1-3 days. If you use inhalers (even only as needed), please bring them with you on the day of your procedure.   We have sent the following medications to your pharmacy for you to pick up at your convenience: Suprep _______________________________________________________  If your blood pressure at your visit was 140/90 or greater, please contact your primary care physician to follow up on this.  _______________________________________________________  If you are age 66 or older, your body mass index should be between 23-30. Your Body mass index is 23.98 kg/m. If this is out of the aforementioned range listed, please consider follow up with your Primary Care Provider.  If you are age 16 or younger, your body mass index should be between 19-25. Your Body mass index is 23.98 kg/m. If this is out of the aformentioned range listed, please consider follow up with your Primary Care Provider.   ________________________________________________________  The [facility] providers would like to encourage you to use [facility] to communicate with providers for non-urgent requests or questions.  Due to long hold times on the telephone, sending your provider a message by [facility] may be a faster and more efficient way to get a response.  Please allow 48 business hours for a response.  Please remember that this is for non-urgent requests.  _______________________________________________________   Thank you for entrusting me with your care and for choosing [facility],  Dr. Christia Reading

## 2022-07-16 ENCOUNTER — Other Ambulatory Visit: Payer: Self-pay | Admitting: Physician Assistant

## 2022-07-16 ENCOUNTER — Telehealth: Payer: Self-pay | Admitting: Internal Medicine

## 2022-07-16 NOTE — Telephone Encounter (Signed)
PT went to pick up prep medication and it was too expensive. He was told to contact us if it was. Please advise

## 2022-07-18 ENCOUNTER — Telehealth: Payer: Self-pay

## 2022-07-18 NOTE — Telephone Encounter (Signed)
I contact patient to go over prep instructions for ECL at [facility] on 08/11/22 with Dr Lorenso Courier no answer left message on voice mail to call office also mailed instructions to patient as well.

## 2022-07-28 ENCOUNTER — Telehealth: Payer: Self-pay

## 2022-07-28 ENCOUNTER — Telehealth: Payer: Self-pay | Admitting: Internal Medicine

## 2022-07-28 NOTE — Telephone Encounter (Signed)
Inbound call from patient want to speak with a nurse regarding instruction , he said "he should not be doing all of that its to much".. Please advise

## 2022-07-28 NOTE — Telephone Encounter (Addendum)
2nd attempt I called patient to discuss prep instructions no answer left message on voicemail to call office.

## 2022-07-29 ENCOUNTER — Encounter: Payer: Self-pay | Admitting: Internal Medicine

## 2022-08-05 ENCOUNTER — Encounter: Payer: Self-pay | Admitting: Physician Assistant

## 2022-08-05 ENCOUNTER — Encounter: Payer: Medicare Other | Admitting: Physician Assistant

## 2022-08-05 ENCOUNTER — Encounter (HOSPITAL_COMMUNITY): Payer: Self-pay | Admitting: Internal Medicine

## 2022-08-05 NOTE — Progress Notes (Signed)
Unable to connect via virtual or phone call.

## 2022-08-06 ENCOUNTER — Ambulatory Visit
Admission: RE | Admit: 2022-08-06 | Discharge: 2022-08-06 | Disposition: A | Discharge status: Home / Self Care | Payer: Medicare Other | Source: Ambulatory Visit | Attending: Family Medicine | Admitting: Family Medicine

## 2022-08-06 DIAGNOSIS — M5416 Radiculopathy, lumbar region: Secondary | ICD-10-CM

## 2022-08-06 MED ORDER — METHYLPREDNISOLONE ACETATE 40 MG/ML INJ SUSP (RADIOLOG
80.0000 mg | Freq: Once | INTRAMUSCULAR | Status: AC
  Administered 2022-08-06: 80 mg via EPIDURAL

## 2022-08-06 MED ORDER — IOPAMIDOL (ISOVUE-M 200) INJECTION 41%
1.0000 mL | Freq: Once | INTRAMUSCULAR | Status: AC
  Administered 2022-08-06: 1 mL via EPIDURAL

## 2022-08-06 NOTE — Discharge Instructions (Signed)

## 2022-08-06 NOTE — Progress Notes (Signed)
Attempted to obtain medical history via telephone, unable to reach at this time. HIPAA compliant voicemail message left requesting return call to pre surgical testing department. 

## 2022-08-10 ENCOUNTER — Encounter (HOSPITAL_COMMUNITY): Payer: Self-pay | Admitting: Internal Medicine

## 2022-08-11 ENCOUNTER — Ambulatory Visit (HOSPITAL_COMMUNITY): Payer: Medicare Other | Admitting: Anesthesiology

## 2022-08-11 ENCOUNTER — Encounter (HOSPITAL_COMMUNITY)
Admission: RE | Disposition: A | Discharge status: Home / Self Care | Payer: Self-pay | Source: Home / Self Care | Attending: Internal Medicine

## 2022-08-11 ENCOUNTER — Other Ambulatory Visit: Payer: Self-pay

## 2022-08-11 ENCOUNTER — Ambulatory Visit (HOSPITAL_COMMUNITY)
Admission: RE | Admit: 2022-08-11 | Discharge: 2022-08-11 | Disposition: A | Discharge status: Home / Self Care | Payer: Medicare Other | Attending: Internal Medicine | Admitting: Internal Medicine

## 2022-08-11 ENCOUNTER — Ambulatory Visit (HOSPITAL_BASED_OUTPATIENT_CLINIC_OR_DEPARTMENT_OTHER): Payer: Medicare Other | Admitting: Anesthesiology

## 2022-08-11 ENCOUNTER — Encounter (HOSPITAL_COMMUNITY): Payer: Self-pay | Admitting: Internal Medicine

## 2022-08-11 DIAGNOSIS — D128 Benign neoplasm of rectum: Secondary | ICD-10-CM

## 2022-08-11 DIAGNOSIS — I10 Essential (primary) hypertension: Secondary | ICD-10-CM

## 2022-08-11 DIAGNOSIS — Z87891 Personal history of nicotine dependence: Secondary | ICD-10-CM

## 2022-08-11 DIAGNOSIS — E785 Hyperlipidemia, unspecified: Secondary | ICD-10-CM | POA: Diagnosis not present

## 2022-08-11 DIAGNOSIS — K2270 Barrett's esophagus without dysplasia: Secondary | ICD-10-CM | POA: Diagnosis not present

## 2022-08-11 DIAGNOSIS — K319 Disease of stomach and duodenum, unspecified: Secondary | ICD-10-CM | POA: Insufficient documentation

## 2022-08-11 DIAGNOSIS — K449 Diaphragmatic hernia without obstruction or gangrene: Secondary | ICD-10-CM | POA: Insufficient documentation

## 2022-08-11 DIAGNOSIS — K5730 Diverticulosis of large intestine without perforation or abscess without bleeding: Secondary | ICD-10-CM | POA: Insufficient documentation

## 2022-08-11 DIAGNOSIS — Z1211 Encounter for screening for malignant neoplasm of colon: Secondary | ICD-10-CM

## 2022-08-11 DIAGNOSIS — K621 Rectal polyp: Secondary | ICD-10-CM | POA: Insufficient documentation

## 2022-08-11 DIAGNOSIS — K219 Gastro-esophageal reflux disease without esophagitis: Secondary | ICD-10-CM

## 2022-08-11 DIAGNOSIS — Z79899 Other long term (current) drug therapy: Secondary | ICD-10-CM | POA: Diagnosis not present

## 2022-08-11 DIAGNOSIS — K222 Esophageal obstruction: Secondary | ICD-10-CM

## 2022-08-11 DIAGNOSIS — K2970 Gastritis, unspecified, without bleeding: Secondary | ICD-10-CM

## 2022-08-11 DIAGNOSIS — R12 Heartburn: Secondary | ICD-10-CM | POA: Diagnosis not present

## 2022-08-11 HISTORY — PX: ESOPHAGOGASTRODUODENOSCOPY (EGD) WITH PROPOFOL: SHX5813

## 2022-08-11 HISTORY — PX: POLYPECTOMY: SHX5525

## 2022-08-11 HISTORY — PX: BALLOON DILATION: SHX5330

## 2022-08-11 HISTORY — PX: BIOPSY: SHX5522

## 2022-08-11 HISTORY — PX: COLONOSCOPY WITH PROPOFOL: SHX5780

## 2022-08-11 SURGERY — ESOPHAGOGASTRODUODENOSCOPY (EGD) WITH PROPOFOL
Anesthesia: Monitor Anesthesia Care

## 2022-08-11 MED ORDER — MIDAZOLAM HCL 2 MG/2ML IJ SOLN
INTRAMUSCULAR | Status: AC
  Filled 2022-08-11: qty 2

## 2022-08-11 MED ORDER — LACTATED RINGERS IV SOLN
INTRAVENOUS | Status: AC | PRN
  Administered 2022-08-11: 10 mL/h via INTRAVENOUS

## 2022-08-11 MED ORDER — DEXMEDETOMIDINE HCL IN NACL 80 MCG/20ML IV SOLN
INTRAVENOUS | Status: DC | PRN
  Administered 2022-08-11: 4 ug via BUCCAL

## 2022-08-11 MED ORDER — MIDAZOLAM HCL 5 MG/5ML IJ SOLN
INTRAMUSCULAR | Status: DC | PRN
  Administered 2022-08-11: 2 mg via INTRAVENOUS

## 2022-08-11 MED ORDER — SODIUM CHLORIDE 0.9 % IV SOLN: INTRAVENOUS | Status: DC

## 2022-08-11 MED ORDER — LIDOCAINE HCL (CARDIAC) PF 100 MG/5ML IV SOSY
PREFILLED_SYRINGE | INTRAVENOUS | Status: DC | PRN
  Administered 2022-08-11: 80 mg via INTRAVENOUS

## 2022-08-11 MED ORDER — PROPOFOL 10 MG/ML IV BOLUS
INTRAVENOUS | Status: DC | PRN
  Administered 2022-08-11: 40 mg via INTRAVENOUS

## 2022-08-11 MED ORDER — OMEPRAZOLE MAGNESIUM 20 MG PO TBEC: 40.0000 mg | DELAYED_RELEASE_TABLET | Freq: Every day | ORAL | 1 refills | Status: DC

## 2022-08-11 MED ORDER — PROPOFOL 10 MG/ML IV BOLUS
INTRAVENOUS | Status: AC
  Filled 2022-08-11: qty 20

## 2022-08-11 MED ORDER — PROPOFOL 500 MG/50ML IV EMUL
INTRAVENOUS | Status: DC | PRN
  Administered 2022-08-11: 125 ug/kg/min via INTRAVENOUS

## 2022-08-11 SURGICAL SUPPLY — 25 items

## 2022-08-11 NOTE — Op Note (Signed)
[facility] Patient Name: [name] Procedure Date: 08/11/2022 MRN: [id number] Attending MD: Particia Lather , , [id number] Date of Birth: April 03, 1956 CSN: [id number] Age: 67 Admit Type: Outpatient Procedure:                Upper GI endoscopy Indications:              Heartburn Providers:                Madelyn Brunner" Marisa Severin, RN, Rozetta Nunnery, Technician Referring MD:             Madelyn Brunner" Leonides Schanz Medicines:                Monitored Anesthesia Care Complications:            No immediate complications. Estimated Blood Loss:     Estimated blood loss was minimal. Procedure:                Pre-Anesthesia Assessment:                           - Prior to the procedure, a History and Physical                            was performed, and patient medications and                            allergies were reviewed. The patient's tolerance of                            previous anesthesia was also reviewed. The risks                            and benefits of the procedure and the sedation                            options and risks were discussed with the patient.                            All questions were answered, and informed consent                            was obtained. Prior Anticoagulants: The patient has                            taken no anticoagulant or antiplatelet agents. ASA                            Grade Assessment: III - A patient with severe                            systemic disease. After reviewing the risks and  benefits, the patient was deemed in satisfactory                            condition to undergo the procedure.                           After obtaining informed consent, the endoscope was                            passed under direct vision. Throughout the                            procedure, the patient's blood pressure, pulse, and                             oxygen saturations were monitored continuously. The                            [id number]-H190 ([id number]) Olympus endoscope was introduced                            through the mouth, and advanced to the second part                            of duodenum. The upper GI endoscopy was                            accomplished without difficulty. The patient                            tolerated the procedure well. Scope In: Scope Out: Findings:      One benign-appearing, intrinsic moderate (circumferential scarring or       stenosis; an endoscope may pass) stenosis was found at the       gastroesophageal junction. This stenosis measured less than one cm (in       length). The stenosis was traversed. A TTS dilator was passed through       the scope. Dilation with an 18-19-20 mm balloon dilator was performed to       20 mm. The dilation site was examined and showed mild mucosal       disruption. Biopsies were taken with a cold forceps for histology.      A hiatal hernia was present.      Localized inflammation characterized by congestion (edema), erosions and       erythema was found in the gastric antrum. Biopsies were taken with a       cold forceps for Helicobacter pylori testing.      The examined duodenum was normal. Impression:               - Benign-appearing esophageal stenosis. Dilated.                            Biopsied.                           - Hiatal hernia.                           -  Gastritis. Biopsied.                           - Normal examined duodenum. Moderate Sedation:      Not Applicable - Patient had care per Anesthesia. Recommendation:           - Await pathology results.                           - Use Prilosec (omeprazole) 40 mg PO daily for 2                            months.                           - Perform a colonoscopy today. Procedure Code(s):        --- Professional ---                           [id number], Esophagogastroduodenoscopy, flexible,                             transoral; with transendoscopic balloon dilation of                            esophagus (less than 30 mm diameter)                           43239, 59, Esophagogastroduodenoscopy, flexible,                            transoral; with biopsy, single or multiple Diagnosis Code(s):        --- Professional ---                           K22.2, Esophageal obstruction                           K44.9, Diaphragmatic hernia without obstruction or                            gangrene                           K29.70, Gastritis, unspecified, without bleeding                           R12, Heartburn CPT copyright 2022 American Medical Association. All rights reserved. The codes documented in this report are preliminary and upon coder review may  be revised to meet current compliance requirements. Dr Particia Latherlaire Monroe Qin "Alan RipperClaire" Leonides SchanzDorsey,  08/11/2022 11:58:36 AM Number of Addenda: 0

## 2022-08-11 NOTE — Anesthesia Postprocedure Evaluation (Signed)
Anesthesia Post Note  Patient: RASHI PREVOT  Procedure(s) Performed: ESOPHAGOGASTRODUODENOSCOPY (EGD) WITH PROPOFOL COLONOSCOPY WITH PROPOFOL BALLOON DILATION BIOPSY POLYPECTOMY     Patient location during evaluation: PACU Anesthesia Type: MAC Level of consciousness: awake Pain management: pain level controlled Vital Signs Assessment: post-procedure vital signs reviewed and stable Respiratory status: spontaneous breathing, nonlabored ventilation and respiratory function stable Cardiovascular status: stable and blood pressure returned to baseline Postop Assessment: no apparent nausea or vomiting Anesthetic complications: no   No notable events documented.  Last Vitals:  Vitals:   08/11/22 1200 08/11/22 1210  BP: (!) 130/47 (!) 164/72  Pulse: 79 79  Resp: 17 17  Temp:    SpO2: 100% 99%    Last Pain:  Vitals:   08/11/22 1210  TempSrc:   PainSc: 0-No pain                 Linton Rump
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
## 2022-08-11 NOTE — Op Note (Signed)
[facility] Patient Name: [name] Procedure Date: 08/11/2022 MRN: [id number] Attending MD: Particia Lather , , [id number] Date of Birth: [birth year] CSN: [id number] Age: 67 Admit Type: Outpatient Procedure:                Colonoscopy Indications:              Screening for colorectal malignant neoplasm, This                            is the patient's first colonoscopy Providers:                Madelyn Brunner" Marisa Severin, RN, Rozetta Nunnery, Technician Referring MD:             Ila Mcgill Medicines:                Monitored Anesthesia Care Complications:            No immediate complications. Estimated Blood Loss:     Estimated blood loss was minimal. Procedure:                Pre-Anesthesia Assessment:                           - Prior to the procedure, a History and Physical                            was performed, and patient medications and                            allergies were reviewed. The patient's tolerance of                            previous anesthesia was also reviewed. The risks                            and benefits of the procedure and the sedation                            options and risks were discussed with the patient.                            All questions were answered, and informed consent                            was obtained. Prior Anticoagulants: The patient has                            taken no anticoagulant or antiplatelet agents. ASA                            Grade Assessment: III - A patient with severe  systemic disease. After reviewing the risks and                            benefits, the patient was deemed in satisfactory                            condition to undergo the procedure.                           After obtaining informed consent, the colonoscope                            was passed under direct vision. Throughout the                             procedure, the patient's blood pressure, pulse, and                            oxygen saturations were monitored continuously. The                            PCF-HQ190L ([id number]) Olympus colonoscope was                            introduced through the anus and advanced to the the                            terminal ileum. Scope In: 11:27:22 AM Scope Out: 11:47:23 AM Scope Withdrawal Time: 0 hours 13 minutes 5 seconds  Total Procedure Duration: 0 hours 20 minutes 1 second  Findings:      The terminal ileum appeared normal.      Multiple diverticula were found in the sigmoid colon and descending       colon.      A 3 mm polyp was found in the rectum. The polyp was sessile. The polyp       was removed with a cold snare. Resection and retrieval were complete. Impression:               - The examined portion of the ileum was normal.                           - Diverticulosis in the sigmoid colon and in the                            descending colon.                           - One 3 mm polyp in the rectum, removed with a cold                            snare. Resected and retrieved. Moderate Sedation:      Not Applicable - Patient had care per Anesthesia. Recommendation:           - Discharge patient to home (with escort).                           -  Await pathology results.                           - The findings and recommendations were discussed                            with the patient. Procedure Code(s):        --- Professional ---                           [id number], Colonoscopy, flexible; with removal of                            tumor(s), polyp(s), or other lesion(s) by snare                            technique Diagnosis Code(s):        --- Professional ---                           Z12.11, Encounter for screening for malignant                            neoplasm of colon                           D12.8, Benign neoplasm of rectum                            K57.30, Diverticulosis of large intestine without                            perforation or abscess without bleeding CPT copyright 2022 American Medical Association. All rights reserved. The codes documented in this report are preliminary and upon coder review may  be revised to meet current compliance requirements. Dr Particia Latherlaire Louise Victory "Alan RipperClaire" Leonides SchanzDorsey,  08/11/2022 12:00:53 PM Number of Addenda: 0
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
## 2022-08-11 NOTE — Transfer of Care (Signed)
Immediate Anesthesia Transfer of Care Note  Patient: [name]  Procedure(s) Performed: Procedure(s): ESOPHAGOGASTRODUODENOSCOPY (EGD) WITH PROPOFOL (N/A) COLONOSCOPY WITH PROPOFOL (N/A) BALLOON DILATION BIOPSY POLYPECTOMY  Patient Location: PACU  Anesthesia Type:MAC  Level of Consciousness:  sedated, patient cooperative and responds to stimulation  Airway & Oxygen Therapy:Patient Spontanous Breathing and Patient connected to face mask oxgen  Post-op Assessment:  Report given to PACU RN and Post -op Vital signs reviewed and stable  Post vital signs:  Reviewed and stable  Last Vitals:  Vitals:   08/11/22 0929  BP: (!) 181/70  Pulse: 88  Resp: 13  Temp: 36.7 C  SpO2: 100%    Complications: No apparent anesthesia complications
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
## 2022-08-11 NOTE — Discharge Instructions (Signed)
YOU HAD AN ENDOSCOPIC PROCEDURE TODAY: Refer to the procedure report and other information in the discharge instructions given to you for any specific questions about what was found during the examination. If this information does not answer your questions, please call [place] office at [phone] to clarify.  ° °YOU SHOULD EXPECT: Some feelings of bloating in the abdomen. Passage of more gas than usual. Walking can help get rid of the air that was put into your GI tract during the procedure and reduce the bloating. If you had a lower endoscopy (such as a colonoscopy or flexible sigmoidoscopy) you may notice spotting of blood in your stool or on the toilet paper. Some abdominal soreness may be present for a day or two, also. ° °DIET: Your first meal following the procedure should be a light meal and then it is ok to progress to your normal diet. A half-sandwich or bowl of soup is an example of a good first meal. Heavy or fried foods are harder to digest and may make you feel nauseous or bloated. Drink plenty of fluids but you should avoid alcoholic beverages for 24 hours. If you had a esophageal dilation, please see attached instructions for diet.   ° °ACTIVITY: Your care partner should take you home directly after the procedure. You should plan to take it easy, moving slowly for the rest of the day. You can resume normal activity the day after the procedure however YOU SHOULD NOT DRIVE, use power tools, machinery or perform tasks that involve climbing or major physical exertion for 24 hours (because of the sedation medicines used during the test).  ° °SYMPTOMS TO REPORT IMMEDIATELY: °A gastroenterologist can be reached at any hour. Please call [phone]  for any of the following symptoms:  °Following lower endoscopy (colonoscopy, flexible sigmoidoscopy) °Excessive amounts of blood in the stool  °Significant tenderness, worsening of abdominal pains  °Swelling of the abdomen that is new, acute  °Fever of 100° or  higher  °Following upper endoscopy (EGD, EUS, ERCP, esophageal dilation) °Vomiting of blood or coffee ground material  °New, significant abdominal pain  °New, significant chest pain or pain under the shoulder blades  °Painful or persistently difficult swallowing  °New shortness of breath  °Black, tarry-looking or red, bloody stools ° °FOLLOW UP:  °If any biopsies were taken you will be contacted by phone or by letter within the next 1-3 weeks. Call [phone]  if you have not heard about the biopsies in 3 weeks.  °Please also call with any specific questions about appointments or follow up tests. ° °

## 2022-08-12 ENCOUNTER — Encounter: Payer: Self-pay | Admitting: Internal Medicine

## 2022-08-12 LAB — SURGICAL PATHOLOGY

## 2022-08-13 ENCOUNTER — Encounter (HOSPITAL_COMMUNITY): Payer: Self-pay | Admitting: Internal Medicine

## 2022-08-15 ENCOUNTER — Ambulatory Visit: Payer: Medicare Other | Admitting: Physician Assistant

## 2022-08-18 ENCOUNTER — Encounter: Payer: Self-pay | Admitting: *Deleted

## 2022-08-25 ENCOUNTER — Ambulatory Visit (INDEPENDENT_AMBULATORY_CARE_PROVIDER_SITE_OTHER): Payer: Medicare Other | Admitting: Physician Assistant

## 2022-08-25 ENCOUNTER — Encounter: Payer: Self-pay | Admitting: Physician Assistant

## 2022-08-25 VITALS — BP 112/68 | HR 76 | Temp 98.2°F | Ht 69.0 in | Wt 165.0 lb

## 2022-08-25 DIAGNOSIS — F32A Depression, unspecified: Secondary | ICD-10-CM | POA: Insufficient documentation

## 2022-08-25 DIAGNOSIS — R053 Chronic cough: Secondary | ICD-10-CM | POA: Diagnosis not present

## 2022-08-25 DIAGNOSIS — F419 Anxiety disorder, unspecified: Secondary | ICD-10-CM | POA: Diagnosis not present

## 2022-08-25 MED ORDER — SERTRALINE HCL 50 MG PO TABS
50.0000 mg | ORAL_TABLET | Freq: Every day | ORAL | 2 refills | Status: DC
Start: 2022-08-25 — End: 2022-09-16
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
## 2022-08-26 NOTE — Assessment & Plan Note (Signed)
Plan for CXR Former smoker Consider pulmonology referral prn

## 2022-08-26 NOTE — Assessment & Plan Note (Signed)
Flowsheet Row Erroneous Encounter from 08/05/2022 in [facility]  PHQ-9 Total Score 4      Pt would benefit from counseling at this time, referral sent to psychiatry. Will start on Zoloft 50 mg daily. Pt aware of risks vs benefits and possible adverse reactions. He knows to call sooner if any questions or concerns. Needs to keep working on lifestyle habits.

## 2022-09-16 ENCOUNTER — Other Ambulatory Visit: Payer: Self-pay | Admitting: Physician Assistant

## 2022-09-16 DIAGNOSIS — F32A Depression, unspecified: Secondary | ICD-10-CM

## 2022-10-12 ENCOUNTER — Other Ambulatory Visit: Payer: Self-pay | Admitting: Physician Assistant

## 2022-10-20 ENCOUNTER — Emergency Department (HOSPITAL_BASED_OUTPATIENT_CLINIC_OR_DEPARTMENT_OTHER): Payer: Medicare Other

## 2022-10-20 ENCOUNTER — Other Ambulatory Visit: Payer: Self-pay

## 2022-10-20 ENCOUNTER — Ambulatory Visit (INDEPENDENT_AMBULATORY_CARE_PROVIDER_SITE_OTHER): Payer: Medicare Other | Admitting: Family Medicine

## 2022-10-20 ENCOUNTER — Emergency Department (HOSPITAL_BASED_OUTPATIENT_CLINIC_OR_DEPARTMENT_OTHER)
Admission: EM | Admit: 2022-10-20 | Discharge: 2022-10-20 | Disposition: A | Discharge status: Home / Self Care | Payer: Medicare Other | Attending: Emergency Medicine | Admitting: Emergency Medicine

## 2022-10-20 ENCOUNTER — Encounter: Payer: Self-pay | Admitting: Family Medicine

## 2022-10-20 VITALS — BP 138/76 | HR 113 | Temp 98.8°F | Wt 159.8 lb

## 2022-10-20 DIAGNOSIS — R000 Tachycardia, unspecified: Secondary | ICD-10-CM | POA: Diagnosis not present

## 2022-10-20 DIAGNOSIS — D72829 Elevated white blood cell count, unspecified: Secondary | ICD-10-CM | POA: Diagnosis not present

## 2022-10-20 DIAGNOSIS — R053 Chronic cough: Secondary | ICD-10-CM | POA: Insufficient documentation

## 2022-10-20 DIAGNOSIS — R109 Unspecified abdominal pain: Secondary | ICD-10-CM | POA: Insufficient documentation

## 2022-10-20 LAB — HEPATIC FUNCTION PANEL
ALT: 40 U/L (ref 0–44)
AST: 37 U/L (ref 15–41)
Albumin: 4.7 g/dL (ref 3.5–5.0)
Alkaline Phosphatase: 74 U/L (ref 38–126)
Bilirubin, Direct: 0.3 mg/dL — ABNORMAL HIGH (ref 0.0–0.2)
Indirect Bilirubin: 1.8 mg/dL — ABNORMAL HIGH (ref 0.3–0.9)
Total Bilirubin: 2.1 mg/dL — ABNORMAL HIGH (ref 0.3–1.2)
Total Protein: 7.2 g/dL (ref 6.5–8.1)

## 2022-10-20 LAB — POCT URINALYSIS DIPSTICK
Bilirubin, UA: NEGATIVE
Blood, UA: NEGATIVE
Glucose, UA: NEGATIVE
Leukocytes, UA: NEGATIVE
Nitrite, UA: NEGATIVE
Protein, UA: POSITIVE — AB
Spec Grav, UA: 1.02 (ref 1.010–1.025)
Urobilinogen, UA: NEGATIVE E.U./dL — AB
pH, UA: 6 (ref 5.0–8.0)

## 2022-10-20 LAB — CBC
HCT: 46.9 % (ref 39.0–52.0)
Hemoglobin: 16.4 g/dL (ref 13.0–17.0)
MCH: 33.7 pg (ref 26.0–34.0)
MCHC: 35 g/dL (ref 30.0–36.0)
MCV: 96.5 fL (ref 80.0–100.0)
Platelets: 338 10*3/uL (ref 150–400)
RBC: 4.86 MIL/uL (ref 4.22–5.81)
RDW: 12.9 % (ref 11.5–15.5)
WBC: 13.2 10*3/uL — ABNORMAL HIGH (ref 4.0–10.5)
nRBC: 0 % (ref 0.0–0.2)

## 2022-10-20 LAB — BASIC METABOLIC PANEL
Anion gap: 13 (ref 5–15)
BUN: 12 mg/dL (ref 8–23)
CO2: 23 mmol/L (ref 22–32)
Calcium: 9.7 mg/dL (ref 8.9–10.3)
Chloride: 100 mmol/L (ref 98–111)
Creatinine, Ser: 1.18 mg/dL (ref 0.61–1.24)
GFR, Estimated: >60 mL/min (ref >60)
Glucose, Bld: 112 mg/dL — ABNORMAL HIGH (ref 70–99)
Potassium: 4.2 mmol/L (ref 3.5–5.1)
Sodium: 136 mmol/L (ref 135–145)

## 2022-10-20 LAB — LIPASE, BLOOD: Lipase: 20 U/L (ref 11–51)

## 2022-10-20 MED ORDER — IOHEXOL 300 MG/ML  SOLN
100.0000 mL | Freq: Once | INTRAMUSCULAR | Status: AC | PRN
  Administered 2022-10-20: 85 mL via INTRAVENOUS

## 2022-10-20 NOTE — ED Triage Notes (Addendum)
Reports vague flank pain x2 weeks. Has become more frequent and worse on right side. Some burning with urination today, no difficulty stopping/starting. Decrease appetite and energy past several days. Denies CP, SOB, and abd pain. Seen by PCP todayCorinda May- sent for workup.

## 2022-10-20 NOTE — Discharge Instructions (Signed)
Follow-up with your primary care care doctor.  Would recommend conversation with the primary care doctor about may be stopping the lisinopril could be related to the chronic cough.  CT scan of the abdomen without any acute findings.  White blood cell count slightly elevated total bili slightly elevated here today but no acute findings on the CT scan of the abdomen.  Return for any new or worse symptoms.  Also we noted your heart rate tends to be a little high.  Could have your primary care doctor check thyroid function to make sure thyroid is working properly.
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
## 2022-10-20 NOTE — Patient Instructions (Addendum)
[facility] [address] [place], [place] [address]

## 2022-10-20 NOTE — ED Notes (Signed)
UA result available from [facility] earlier today.

## 2022-10-20 NOTE — Progress Notes (Signed)
   Established Patient Office Visit   Subjective  Patient ID: [name], male    DOB: [birth year]  Age: 67 y.o. MRN: [id number]  Chief Complaint  Patient presents with   Flank Pain    Both sides hurt like a cramping and comes and goes. Going on for a few weeks , was on and off now more frequent.  BP is now high again. Feels edgy, shaking. Does not feel good.  No energy, no appetite.   Cough    Coughing up phlegm, sometime will get suck in throat. Cough comes and goes.     Patient is a 67 year old male followed by Alyssa Allwardt, PA-C who was seen for ongoing concern.  Pt with b/l flank pain x wks, increasing in severity and frequency.  Patient notes decreased appetite, tachycardia, feeling bad.  Denies nausea and vomiting.  Patient does not drink water.  States urine is really dark.  Pt with a cough.  On PPI for GERD.  Had EGD and colonoscopy 3 wks ago.  On lisinopril  Flank Pain  Cough      Review of Systems  Respiratory:  Positive for cough.   Genitourinary:  Positive for flank pain.   Negative unless stated above    Objective:     BP 138/76 (BP Location: Left Arm, Patient Position: Sitting, Cuff Size: Normal)   Pulse (!) 113   Temp 98.8 F (37.1 C) (Oral)   Wt 159 lb 12.8 oz (72.5 kg)   SpO2 95%   BMI 23.60 kg/m    Physical Exam Constitutional:      Appearance: Normal appearance.  HENT:     Head: Normocephalic and atraumatic.     Nose: Nose normal.     Mouth/Throat:     Mouth: Mucous membranes are moist.  Cardiovascular:     Rate and Rhythm: Tachycardia present.  Pulmonary:     Effort: Pulmonary effort is normal.  Skin:    General: Skin is warm and dry.  Neurological:     Mental Status: He is alert.    Results for orders placed or performed in visit on 10/20/22  POCT urinalysis dipstick  Result Value Ref Range   Color, UA dark yellow    Clarity, UA clear    Glucose, UA Negative Negative   Bilirubin, UA neg    Ketones, UA p    Spec  Grav, UA 1.020 1.010 - 1.025   Blood, UA neg    pH, UA 6.0 5.0 - 8.0   Protein, UA Positive (A) Negative   Urobilinogen, UA negative (A) 0.2 or 1.0 E.U./dL   Nitrite, UA neg    Leukocytes, UA Negative Negative   Appearance     Odor        Assessment & Plan:  Bilateral flank pain -     POCT urinalysis dipstick  Tachycardia  Chronic cough -Chart review x-ray ordered at last OFV with PCP however not completed. -Patient advised cough possibly 2/2 GERD or BP med lisinopril.  Also consider postnasal drainage from seasonal allergies.  Suspected renal calculi causing renal colic given symptoms.  UA negative for UTI.  Unfortunately patient scheduled my other office at 430 when lab is unavailable in clinic.  Discussed options for obtaining labs including proceeding to the [facility].  Pt will proceed to [facility] for further evaluation.  Return if symptoms worsen or fail to improve.   Deeann Saint, MD

## 2022-10-20 NOTE — ED Notes (Signed)
Discharge paperwork given and verbally understood. 

## 2022-10-22 ENCOUNTER — Telehealth: Payer: Self-pay | Admitting: Physician Assistant

## 2022-10-22 NOTE — Telephone Encounter (Signed)
FYI: This call has been transferred to Access Nurse. Once the result note has been entered staff can address the message at that time.  Patient called in with the following symptoms:  Red Word: On 10/21/22 Fainted, hit head, 10/22/22 dizziness and wobbly legs   Please advise at Mobile [phone] (mobile)  Message is routed to Provider Pool and [facility] Triage

## 2022-10-22 NOTE — Telephone Encounter (Signed)
Pt was seen at ER two days ago. Now with new changes in symptoms. EMS called from our office to check on patient at his home.

## 2022-10-22 NOTE — Telephone Encounter (Signed)
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 Change in behaviour after hitting head. Reason for Call Symptomatic / Request for Health Information Initial Comment [name] with office transferring call: patient fainted last night hitting his head and today he's dizziness and wobbly legs Translation No Nurse Assessment Nurse: Bryan Bane, RN, [name] Date/Time (Eastern Time): 10/22/2022 2:15:41 PM Confirm and document reason for call. If symptomatic, describe symptoms. ---Caller states he fainted and hit his head on the back of a cabinet. Does the patient have any new or worsening symptoms? ---Yes Will a triage be completed? ---Yes Related visit to physician within the last 2 weeks? ---No Does the PT have any chronic conditions? (i.e. diabetes, asthma, this includes High risk factors for pregnancy, etc.) ---No Is this a behavioral health or substance abuse call? ---No Guidelines Guideline Title Affirmed Question Affirmed Notes Nurse Date/Time [name] Time) Fainting Any head or face injury [name] 10/22/2022 2:18:07 PM Disp. Time [name] Time) Disposition Final User 10/22/2022 2:13:28 PM Send to Urgent [name], [name] 10/22/2022 2:22:45 PM Go to ED Now Yes Bryan Bane, RN, [name] Final Disposition 10/22/2022 2:22:45 PM Go to ED Now Yes Bryan Bane, RN, [name] Disagree/Comply Disagree Caller  Understands Yes PreDisposition Did not know what to do Care Advice Given Per Guideline GO TO ED NOW: * You need to be seen in the Emergency Department. * Go to the ED at ___________ Hospital. ANOTHER ADULT SHOULD DRIVE: * It is better and safer if another adult drives instead of you. * Too dizzy or weak to stand * You become worse * Difficulty breathing or confusion occurs * Faints again CALL EMS 911 IF: CARE ADVICE given per Fainting (Adult) guideline. Referrals GO TO FACILITY REFUSED

## 2022-10-22 NOTE — Telephone Encounter (Signed)
Awaiting triage note.  

## 2022-10-23 ENCOUNTER — Telehealth: Payer: Self-pay | Admitting: Physician Assistant

## 2022-10-23 NOTE — Telephone Encounter (Signed)
Please see phone note and schedule patient

## 2022-10-23 NOTE — Telephone Encounter (Signed)
See phone note

## 2022-10-23 NOTE — Telephone Encounter (Signed)
LVM to schedule OV 

## 2022-11-24 ENCOUNTER — Encounter: Payer: Self-pay | Admitting: Physician Assistant

## 2022-11-24 ENCOUNTER — Ambulatory Visit (HOSPITAL_COMMUNITY)
Admission: RE | Admit: 2022-11-24 | Discharge: 2022-11-24 | Disposition: A | Discharge status: Home / Self Care | Payer: Medicare Other | Source: Ambulatory Visit | Attending: Physician Assistant | Admitting: Physician Assistant

## 2022-11-24 ENCOUNTER — Ambulatory Visit (INDEPENDENT_AMBULATORY_CARE_PROVIDER_SITE_OTHER): Payer: Medicare Other | Admitting: Physician Assistant

## 2022-11-24 VITALS — BP 152/82 | HR 88 | Temp 98.2°F | Ht 71.0 in | Wt 171.2 lb

## 2022-11-24 DIAGNOSIS — R296 Repeated falls: Secondary | ICD-10-CM

## 2022-11-24 DIAGNOSIS — R42 Dizziness and giddiness: Secondary | ICD-10-CM

## 2022-11-24 DIAGNOSIS — R053 Chronic cough: Secondary | ICD-10-CM

## 2022-11-24 DIAGNOSIS — F1010 Alcohol abuse, uncomplicated: Secondary | ICD-10-CM | POA: Diagnosis not present

## 2022-11-24 DIAGNOSIS — M542 Cervicalgia: Secondary | ICD-10-CM | POA: Insufficient documentation

## 2022-11-24 DIAGNOSIS — S0990XA Unspecified injury of head, initial encounter: Secondary | ICD-10-CM | POA: Insufficient documentation

## 2022-11-24 DIAGNOSIS — I10 Essential (primary) hypertension: Secondary | ICD-10-CM

## 2022-11-24 LAB — POC URINALSYSI DIPSTICK (AUTOMATED)
Bilirubin, UA: NEGATIVE
Blood, UA: NEGATIVE
Glucose, UA: NEGATIVE
Ketones, UA: NEGATIVE
Leukocytes, UA: NEGATIVE
Nitrite, UA: NEGATIVE
Protein, UA: NEGATIVE
Spec Grav, UA: 1.02 (ref 1.010–1.025)
Urobilinogen, UA: 0.2 E.U./dL
pH, UA: 6 (ref 5.0–8.0)

## 2022-11-24 LAB — CBC WITH DIFFERENTIAL/PLATELET
Basophils Absolute: 0 10*3/uL (ref 0.0–0.1)
Basophils Relative: 0.3 % (ref 0.0–3.0)
Eosinophils Absolute: 0.1 10*3/uL (ref 0.0–0.7)
Eosinophils Relative: 1.8 % (ref 0.0–5.0)
HCT: 38.3 % — ABNORMAL LOW (ref 39.0–52.0)
Hemoglobin: 12.8 g/dL — ABNORMAL LOW (ref 13.0–17.0)
Lymphocytes Relative: 14.4 % (ref 12.0–46.0)
Lymphs Abs: 1 10*3/uL (ref 0.7–4.0)
MCHC: 33.5 g/dL (ref 30.0–36.0)
MCV: 100.8 fl — ABNORMAL HIGH (ref 78.0–100.0)
Monocytes Absolute: 0.7 10*3/uL (ref 0.1–1.0)
Monocytes Relative: 10.4 % (ref 3.0–12.0)
Neutro Abs: 4.9 10*3/uL (ref 1.4–7.7)
Neutrophils Relative %: 73.1 % (ref 43.0–77.0)
Platelets: 236 10*3/uL (ref 150.0–400.0)
RBC: 3.8 Mil/uL — ABNORMAL LOW (ref 4.22–5.81)
RDW: 13.2 % (ref 11.5–15.5)
WBC: 6.7 10*3/uL (ref 4.0–10.5)

## 2022-11-24 LAB — COMPREHENSIVE METABOLIC PANEL
ALT: 20 U/L (ref 0–53)
AST: 20 U/L (ref 0–37)
Albumin: 4 g/dL (ref 3.5–5.2)
Alkaline Phosphatase: 59 U/L (ref 39–117)
BUN: 13 mg/dL (ref 6–23)
CO2: 28 mEq/L (ref 19–32)
Calcium: 9 mg/dL (ref 8.4–10.5)
Chloride: 106 mEq/L (ref 96–112)
Creatinine, Ser: 0.93 mg/dL (ref 0.40–1.50)
GFR: 85.56 mL/min (ref >60.00)
Glucose, Bld: 99 mg/dL (ref 70–99)
Potassium: 4.9 mEq/L (ref 3.5–5.1)
Sodium: 141 mEq/L (ref 135–145)
Total Bilirubin: 0.7 mg/dL (ref 0.2–1.2)
Total Protein: 6.1 g/dL (ref 6.0–8.3)

## 2022-11-24 LAB — AMMONIA: Ammonia: 25 umol/L (ref 11–35)

## 2022-11-24 LAB — TSH: TSH: 1.17 u[IU]/mL (ref 0.35–5.50)

## 2022-11-24 NOTE — Patient Instructions (Addendum)
You are going to have STAT labs done today and STAT imaging done of your head and neck.   I have also place a CT order for your chest due to your chronic worsening cough.   You need to stop drinking alcohol and replace this with water, at least 80 - 100 ounces daily.  IF any further falls, loss of consciousness, head injuries, severe dizziness, etc, present to the ER immediately.
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
## 2022-11-25 LAB — URINE DRUG PANEL 7
Amphetamines, Urine: NEGATIVE ng/mL
Barbiturate Quant, Ur: NEGATIVE ng/mL

## 2022-11-27 LAB — URINE DRUG PANEL 7
Benzodiazepine Quant, Ur: NEGATIVE ng/mL
Opiate Quant, Ur: NEGATIVE ng/mL

## 2022-11-27 LAB — VITAMIN B1: Vitamin B1 (Thiamine): 7 nmol/L — ABNORMAL LOW (ref 8–30)

## 2022-12-02 ENCOUNTER — Other Ambulatory Visit (HOSPITAL_COMMUNITY): Payer: Self-pay

## 2022-12-02 ENCOUNTER — Telehealth: Payer: Self-pay

## 2022-12-02 DIAGNOSIS — R911 Solitary pulmonary nodule: Secondary | ICD-10-CM

## 2022-12-02 DIAGNOSIS — R053 Chronic cough: Secondary | ICD-10-CM

## 2022-12-02 NOTE — Telephone Encounter (Signed)
Patient returned call and has been notified of the result and verbalized understanding.  All questions (if any) were answered.  Placed referral to pulmonology

## 2022-12-02 NOTE — Telephone Encounter (Addendum)
---  Left a voice message asking patient to give the office a call back for results from chest CT. Will try calling again  -- Message from Crist Infante Allwardt sent at 12/01/2022  2:30 PM EDT ----- Please send referral to pulmonology for lung nodule and chronic cough. Inform pt I will discuss with him at office visit.

## 2022-12-02 NOTE — Telephone Encounter (Signed)
Patient returned call. Requests to be called. 

## 2022-12-09 ENCOUNTER — Encounter: Payer: Self-pay | Admitting: Physician Assistant

## 2022-12-09 ENCOUNTER — Ambulatory Visit (INDEPENDENT_AMBULATORY_CARE_PROVIDER_SITE_OTHER): Payer: Medicare Other | Admitting: Physician Assistant

## 2022-12-09 VITALS — BP 142/80 | HR 70 | Temp 97.5°F | Wt 163.6 lb

## 2022-12-09 DIAGNOSIS — R42 Dizziness and giddiness: Secondary | ICD-10-CM

## 2022-12-09 DIAGNOSIS — I10 Essential (primary) hypertension: Secondary | ICD-10-CM

## 2022-12-09 DIAGNOSIS — M542 Cervicalgia: Secondary | ICD-10-CM

## 2022-12-09 DIAGNOSIS — D649 Anemia, unspecified: Secondary | ICD-10-CM

## 2022-12-09 DIAGNOSIS — R937 Abnormal findings on diagnostic imaging of other parts of musculoskeletal system: Secondary | ICD-10-CM | POA: Diagnosis not present

## 2022-12-09 DIAGNOSIS — R296 Repeated falls: Secondary | ICD-10-CM

## 2022-12-09 DIAGNOSIS — R053 Chronic cough: Secondary | ICD-10-CM

## 2022-12-09 DIAGNOSIS — R911 Solitary pulmonary nodule: Secondary | ICD-10-CM | POA: Diagnosis not present

## 2022-12-09 DIAGNOSIS — F1010 Alcohol abuse, uncomplicated: Secondary | ICD-10-CM
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
## 2022-12-09 NOTE — Patient Instructions (Addendum)
Call [facility]: [phone] to schedule appt to discuss chronic cough and lung nodule  Call [facility]: [phone] to follow up on your CT of cervical spine and neck pain  Recheck labs today - will let you know of results  Monitor blood pressure readings, keep log and bring to next appointment  Regular food and water intake; slowly work on decreasing alcohol intake   ER if any sudden falls, severe dizziness, etc.
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
## 2022-12-24 ENCOUNTER — Ambulatory Visit: Payer: Medicare Other | Admitting: Family Medicine

## 2022-12-24 ENCOUNTER — Ambulatory Visit: Payer: Self-pay

## 2022-12-24 VITALS — BP 140/70 | HR 76 | Ht 71.0 in | Wt 165.0 lb

## 2022-12-24 DIAGNOSIS — M25511 Pain in right shoulder: Secondary | ICD-10-CM | POA: Diagnosis not present

## 2022-12-24 DIAGNOSIS — M542 Cervicalgia: Secondary | ICD-10-CM | POA: Diagnosis not present

## 2022-12-24 DIAGNOSIS — T84498A Other mechanical complication of other internal orthopedic devices, implants and grafts, initial encounter: Secondary | ICD-10-CM

## 2022-12-24 DIAGNOSIS — M5416 Radiculopathy, lumbar region: Secondary | ICD-10-CM | POA: Diagnosis not present

## 2022-12-24 DIAGNOSIS — Z9889 Other specified postprocedural states: Secondary | ICD-10-CM

## 2022-12-24 NOTE — Patient Instructions (Signed)
We will get another epidural of your back  We will refer you to neurosurgery to discuss the loose screw in your neck that I think is causing the shoulder pain as well  Ice 20 minutes 2 times daily. Usually after activity and before bed. Voltaren gel 2 times a day as needed See me again in 6-8 weeks after the back injection

## 2022-12-25 DIAGNOSIS — T84498A Other mechanical complication of other internal orthopedic devices, implants and grafts, initial encounter: Secondary | ICD-10-CM | POA: Insufficient documentation

## 2022-12-25 NOTE — Assessment & Plan Note (Addendum)
Unfortunately patient's CT scan as well as symptoms are corresponding with the loosening at this time.  Patient is having more radicular symptoms on the right side but unfortunately with extension of the neck severe discomfort noted on the left side of the neck where the loosening is noted.  Discussed with patient about icing regimen and home exercises, which activities to do and which ones to avoid.  Will refer to neurosurgery to discuss further treatment options.

## 2022-12-29 ENCOUNTER — Other Ambulatory Visit: Payer: Self-pay | Admitting: Physician Assistant

## 2023-01-01 ENCOUNTER — Ambulatory Visit (INDEPENDENT_AMBULATORY_CARE_PROVIDER_SITE_OTHER): Payer: Medicare Other | Admitting: Physician Assistant

## 2023-01-01 VITALS — BP 128/80 | HR 69 | Temp 98.2°F | Ht 71.0 in | Wt 164.6 lb

## 2023-01-01 DIAGNOSIS — M542 Cervicalgia: Secondary | ICD-10-CM

## 2023-01-01 DIAGNOSIS — R9431 Abnormal electrocardiogram [ECG] [EKG]: Secondary | ICD-10-CM

## 2023-01-01 DIAGNOSIS — R42 Dizziness and giddiness: Secondary | ICD-10-CM | POA: Diagnosis not present

## 2023-01-01 DIAGNOSIS — F32A Depression, unspecified: Secondary | ICD-10-CM

## 2023-01-01 DIAGNOSIS — R053 Chronic cough: Secondary | ICD-10-CM

## 2023-01-01 DIAGNOSIS — F419 Anxiety disorder, unspecified: Secondary | ICD-10-CM

## 2023-01-01 MED ORDER — ESCITALOPRAM OXALATE 10 MG PO TABS
ORAL_TABLET | ORAL | 2 refills | Status: DC
Start: 2023-01-01 — End: 2023-01-16

## 2023-01-01 MED ORDER — PROPRANOLOL HCL 20 MG PO TABS
ORAL_TABLET | ORAL | 0 refills | Status: DC
Start: 2023-01-01 — End: 2023-01-16
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
## 2023-01-02 NOTE — Discharge Instructions (Signed)

## 2023-01-05 NOTE — Assessment & Plan Note (Signed)
Counseling still advised. Didn't tolerate Zoloft. Will trial Lexapro as directed. Propranolol prn acute anxiety. Pt aware of risks vs benefits and possible adverse reactions. Call sooner if concerns.  [facility] info provided.

## 2023-01-05 NOTE — Assessment & Plan Note (Signed)
Appt with pulmonology coming up

## 2023-01-05 NOTE — Patient Instructions (Signed)
Take new medications as instructed.  Keep specialist appointments.   Stay well hydrated.   If you develop suicidal thoughts, please tell someone and immediately proceed to our local 24/7 crisis center, [facility].[address], [place], [place] [phone].

## 2023-01-06 ENCOUNTER — Inpatient Hospital Stay
Admission: RE | Admit: 2023-01-06 | Discharge: 2023-01-06 | Disposition: A | Discharge status: Home / Self Care | Payer: Medicare Other | Source: Ambulatory Visit | Attending: Family Medicine | Admitting: Family Medicine

## 2023-01-07 ENCOUNTER — Institutional Professional Consult (permissible substitution): Payer: Medicare Other | Admitting: Pulmonary Disease

## 2023-01-14 ENCOUNTER — Other Ambulatory Visit: Payer: Self-pay | Admitting: Physician Assistant

## 2023-01-14 ENCOUNTER — Institutional Professional Consult (permissible substitution): Payer: Medicare Other | Admitting: Pulmonary Disease

## 2023-01-15 ENCOUNTER — Other Ambulatory Visit: Payer: Self-pay | Admitting: Physician Assistant

## 2023-01-15 DIAGNOSIS — F32A Depression, unspecified: Secondary | ICD-10-CM

## 2023-01-23 ENCOUNTER — Emergency Department (HOSPITAL_BASED_OUTPATIENT_CLINIC_OR_DEPARTMENT_OTHER)
Admission: EM | Admit: 2023-01-23 | Discharge: 2023-01-23 | Disposition: A | Discharge status: Home / Self Care | Payer: Medicare Other | Attending: Emergency Medicine | Admitting: Emergency Medicine

## 2023-01-23 ENCOUNTER — Encounter (HOSPITAL_BASED_OUTPATIENT_CLINIC_OR_DEPARTMENT_OTHER): Payer: Self-pay | Admitting: Emergency Medicine

## 2023-01-23 ENCOUNTER — Emergency Department (HOSPITAL_BASED_OUTPATIENT_CLINIC_OR_DEPARTMENT_OTHER): Payer: Medicare Other | Admitting: Radiology

## 2023-01-23 ENCOUNTER — Other Ambulatory Visit: Payer: Self-pay

## 2023-01-23 DIAGNOSIS — R112 Nausea with vomiting, unspecified: Secondary | ICD-10-CM | POA: Diagnosis present

## 2023-01-23 DIAGNOSIS — E119 Type 2 diabetes mellitus without complications: Secondary | ICD-10-CM | POA: Diagnosis not present

## 2023-01-23 DIAGNOSIS — Z20822 Contact with and (suspected) exposure to covid-19: Secondary | ICD-10-CM | POA: Insufficient documentation

## 2023-01-23 DIAGNOSIS — E871 Hypo-osmolality and hyponatremia: Secondary | ICD-10-CM | POA: Diagnosis not present

## 2023-01-23 DIAGNOSIS — Z79899 Other long term (current) drug therapy: Secondary | ICD-10-CM | POA: Diagnosis not present

## 2023-01-23 DIAGNOSIS — R0602 Shortness of breath: Secondary | ICD-10-CM | POA: Diagnosis not present

## 2023-01-23 DIAGNOSIS — E785 Hyperlipidemia, unspecified: Secondary | ICD-10-CM | POA: Diagnosis not present

## 2023-01-23 DIAGNOSIS — R1013 Epigastric pain: Secondary | ICD-10-CM | POA: Insufficient documentation

## 2023-01-23 HISTORY — DX: Other nonspecific abnormal finding of lung field: R91.8

## 2023-01-23 LAB — CBC WITH DIFFERENTIAL/PLATELET
Abs Immature Granulocytes: 0.06 10*3/uL (ref 0.00–0.07)
Basophils Absolute: 0 10*3/uL (ref 0.0–0.1)
Basophils Relative: 0 %
Eosinophils Absolute: 0.3 10*3/uL (ref 0.0–0.5)
Eosinophils Relative: 3 %
HCT: 35.8 % — ABNORMAL LOW (ref 39.0–52.0)
Hemoglobin: 13.2 g/dL (ref 13.0–17.0)
Immature Granulocytes: 1 %
Lymphocytes Relative: 16 %
Lymphs Abs: 1.6 10*3/uL (ref 0.7–4.0)
MCH: 33.2 pg (ref 26.0–34.0)
MCHC: 36.9 g/dL — ABNORMAL HIGH (ref 30.0–36.0)
MCV: 90.2 fL (ref 80.0–100.0)
Monocytes Absolute: 1.1 10*3/uL — ABNORMAL HIGH (ref 0.1–1.0)
Monocytes Relative: 11 %
Neutro Abs: 6.7 10*3/uL (ref 1.7–7.7)
Neutrophils Relative %: 69 %
Platelets: 285 10*3/uL (ref 150–400)
RBC: 3.97 MIL/uL — ABNORMAL LOW (ref 4.22–5.81)
RDW: 12.8 % (ref 11.5–15.5)
WBC: 9.8 10*3/uL (ref 4.0–10.5)
nRBC: 0 % (ref 0.0–0.2)

## 2023-01-23 LAB — COMPREHENSIVE METABOLIC PANEL
ALT: 38 U/L (ref 0–44)
AST: 46 U/L — ABNORMAL HIGH (ref 15–41)
Albumin: 4.4 g/dL (ref 3.5–5.0)
Alkaline Phosphatase: 72 U/L (ref 38–126)
Anion gap: 13 (ref 5–15)
BUN: 12 mg/dL (ref 8–23)
CO2: 24 mmol/L (ref 22–32)
Calcium: 9.4 mg/dL (ref 8.9–10.3)
Chloride: 88 mmol/L — ABNORMAL LOW (ref 98–111)
Creatinine, Ser: 1 mg/dL (ref 0.61–1.24)
GFR, Estimated: >60 mL/min (ref >60)
Glucose, Bld: 93 mg/dL (ref 70–99)
Potassium: 4.9 mmol/L (ref 3.5–5.1)
Sodium: 125 mmol/L — ABNORMAL LOW (ref 135–145)
Total Bilirubin: 0.8 mg/dL (ref 0.3–1.2)
Total Protein: 7.3 g/dL (ref 6.5–8.1)

## 2023-01-23 LAB — TROPONIN I (HIGH SENSITIVITY)
Troponin I (High Sensitivity): 13 ng/L (ref <18)
Troponin I (High Sensitivity): 12 ng/L (ref <18)

## 2023-01-23 LAB — URINALYSIS, ROUTINE W REFLEX MICROSCOPIC
Bilirubin Urine: NEGATIVE
Glucose, UA: NEGATIVE mg/dL
Hgb urine dipstick: NEGATIVE
Ketones, ur: NEGATIVE mg/dL
Leukocytes,Ua: NEGATIVE
Nitrite: NEGATIVE
Protein, ur: NEGATIVE mg/dL
Specific Gravity, Urine: <1.005 — ABNORMAL LOW (ref 1.005–1.030)
pH: 6.5 (ref 5.0–8.0)

## 2023-01-23 LAB — ETHANOL: Alcohol, Ethyl (B): 50 mg/dL — ABNORMAL HIGH (ref <10)

## 2023-01-23 LAB — LIPASE, BLOOD: Lipase: 40 U/L (ref 11–51)

## 2023-01-23 LAB — SARS CORONAVIRUS 2 BY RT PCR: SARS Coronavirus 2 by RT PCR: NEGATIVE

## 2023-01-23 MED ORDER — ONDANSETRON HCL 4 MG/2ML IJ SOLN
4.0000 mg | Freq: Once | INTRAMUSCULAR | Status: AC
  Administered 2023-01-23: 4 mg via INTRAVENOUS
  Filled 2023-01-23: qty 2

## 2023-01-23 MED ORDER — SODIUM CHLORIDE 0.9 % IV BOLUS
500.0000 mL | Freq: Once | INTRAVENOUS | Status: AC
  Administered 2023-01-23: 500 mL via INTRAVENOUS

## 2023-01-23 MED ORDER — SODIUM CHLORIDE 0.9 % IV SOLN
12.5000 mg | Freq: Once | INTRAVENOUS | Status: AC
  Administered 2023-01-23: 12.5 mg via INTRAVENOUS
  Filled 2023-01-23: qty 0.5

## 2023-01-23 MED ORDER — PANTOPRAZOLE SODIUM 40 MG PO TBEC: 40.0000 mg | DELAYED_RELEASE_TABLET | Freq: Every day | ORAL | 0 refills | Status: DC

## 2023-01-23 MED ORDER — CHLORDIAZEPOXIDE HCL 25 MG PO CAPS: ORAL_CAPSULE | ORAL | 0 refills | Status: DC

## 2023-01-23 MED ORDER — PROMETHAZINE HCL 25 MG/ML IJ SOLN
INTRAMUSCULAR | Status: AC
  Filled 2023-01-23: qty 1

## 2023-01-23 MED ORDER — ONDANSETRON 4 MG PO TBDP: 4.0000 mg | ORAL_TABLET | Freq: Three times a day (TID) | ORAL | 0 refills | Status: DC | PRN

## 2023-01-23 MED ORDER — PANTOPRAZOLE SODIUM 40 MG IV SOLR
40.0000 mg | Freq: Once | INTRAVENOUS | Status: AC
  Administered 2023-01-23: 40 mg via INTRAVENOUS
  Filled 2023-01-23: qty 10

## 2023-01-23 MED ORDER — CHLORDIAZEPOXIDE HCL 25 MG PO CAPS
25.0000 mg | ORAL_CAPSULE | Freq: Once | ORAL | Status: AC
  Administered 2023-01-23: 25 mg via ORAL
  Filled 2023-01-23: qty 1

## 2023-01-23 NOTE — Discharge Instructions (Addendum)
Please follow-up with your primary care doctor, in 3 days, and get your sodium rechecked.  I have prescribed you some nausea medicine as well as Librium, to help you stop drinking.  If you have a seizure, intractable nausea, vomiting, confusion return to the ER immediately.  You should limit your liquid intake to 2000 mL a day.

## 2023-01-23 NOTE — ED Provider Notes (Signed)
[place] [facility]ERGENCY DEPARTMENT AT [facility] Provider Note   CSN: [id number] Arrival date & time: 01/23/23  1331     History  Chief Complaint  Patient presents with   Nausea    [name] is a 67 y.o. male, history of hyperlipidemia, lung nodules, diabetes, who presents to the ED secondary to nausea for the last 3 days, and associated weakness.  He states he just feels very weak, and nauseated.  Reports some epigastric discomfort, has been drinking about 6-12 beers a day, persistently.  Drink 2 beers this morning, states that it did help a little bit with the pain.  Denies any kind of chest pain, or shortness of breath.  States that shortness of breath is chronic, but also states that when he stands up he gets bit dizzy.  Denies any headache, or trauma to the head    Home Medications Prior to Admission medications   Medication Sig Start Date End Date Taking? Authorizing Provider  chlordiazePOXIDE (LIBRIUM) 25 MG capsule 50mg  PO TID x 1D, then 25-50mg  PO BID X 1D, then 25-50mg  PO QD X 1D 01/23/23  Yes Shonte Soderlund L, PA  ondansetron (ZOFRAN-ODT) 4 MG disintegrating tablet Take 1 tablet (4 mg total) by mouth every 8 (eight) hours as needed. 01/23/23  Yes Matthe Sloane L, PA  pantoprazole (PROTONIX) 40 MG tablet Take 1 tablet (40 mg total) by mouth daily. 01/23/23  Yes Ivonna Kinnick L, PA  atorvastatin (LIPITOR) 10 MG tablet Take 1 tablet (10 mg total) by mouth at bedtime. 02/21/22   Allwardt, Crist Infante, PA-C  baclofen (LIORESAL) 10 MG tablet TAKE 1 TABLET BY MOUTH THREE TIMES A DAY AS NEEDED FOR MUSCLE SPASM 12/29/22   Allwardt, Alyssa M, PA-C  escitalopram (LEXAPRO) 10 MG tablet TAKE 1/2 TAB BY MOUTH WITH BREAKFAST X 1 WEEK, THEN INCREASE TO 1 TAB EVERY DAY AFTER THEN. 01/16/23   Allwardt, Alyssa M, PA-C  ibuprofen (ADVIL) 800 MG tablet Take 800 mg by mouth every 8 (eight) hours as needed for mild pain or moderate pain.    [provider]  lisinopril (ZESTRIL) 20 MG  tablet TAKE 1 TABLET BY MOUTH EVERY DAY 01/14/23   Allwardt, Alyssa M, PA-C  omeprazole (PRILOSEC OTC) 20 MG tablet Take 2 tablets (40 mg total) by mouth daily. 08/11/22   Imogene Burn, MD  propranolol (INDERAL) 20 MG tablet TAKE ONE TAB BY MOUTH ADMINISTERED 30 TO 60 MINUTES PRIOR TO ANXIETY-PROVOKING SITUATION 01/16/23   Allwardt, Crist Infante, PA-C  sildenafil (VIAGRA) 100 MG tablet Take 1 tablet (100 mg total) by mouth daily as needed for erectile dysfunction. 05/24/21   Allwardt, Crist Infante, PA-C      Allergies    Patient has no known allergies.    Review of Systems   Review of Systems  Gastrointestinal:  Positive for nausea. Negative for vomiting.    Physical Exam Updated Vital Signs BP (!) 149/83   Pulse 79   Temp 98.2 F (36.8 C) (Oral)   Resp 15   SpO2 98%  Physical Exam Vitals and nursing note reviewed.  Constitutional:      General: He is not in acute distress.    Appearance: He is well-developed.  HENT:     Head: Normocephalic and atraumatic.  Eyes:     Conjunctiva/sclera: Conjunctivae normal.  Cardiovascular:     Rate and Rhythm: Normal rate and regular rhythm.     Heart sounds: No murmur heard. Pulmonary:     Effort:  Pulmonary effort is normal. No respiratory distress.     Breath sounds: Normal breath sounds.  Abdominal:     Palpations: Abdomen is soft.     Tenderness: There is no abdominal tenderness.  Musculoskeletal:        General: No swelling.     Cervical back: Neck supple.  Skin:    General: Skin is warm and dry.     Capillary Refill: Capillary refill takes less than 2 seconds.  Neurological:     Mental Status: He is alert.  Psychiatric:        Mood and Affect: Mood normal.     ED Results / Procedures / Treatments   Labs (all labs ordered are listed, but only abnormal results are displayed) Labs Reviewed  COMPREHENSIVE METABOLIC PANEL - Abnormal; Notable for the following components:      Result Value   Sodium 125 (*)    Chloride 88 (*)    AST  46 (*)    All other components within normal limits  URINALYSIS, ROUTINE W REFLEX MICROSCOPIC - Abnormal; Notable for the following components:   Color, Urine COLORLESS (*)    Specific Gravity, Urine <1.005 (*)    All other components within normal limits  CBC WITH DIFFERENTIAL/PLATELET - Abnormal; Notable for the following components:   RBC 3.97 (*)    HCT 35.8 (*)    MCHC 36.9 (*)    Monocytes Absolute 1.1 (*)    All other components within normal limits  ETHANOL - Abnormal; Notable for the following components:   Alcohol, Ethyl (B) 50 (*)    All other components within normal limits  SARS CORONAVIRUS 2 BY RT PCR  LIPASE, BLOOD  TROPONIN I (HIGH SENSITIVITY)  TROPONIN I (HIGH SENSITIVITY)    EKG EKG Interpretation Date/Time:  Friday January 23 2023 14:06:58 EDT Ventricular Rate:  88 PR Interval:  160 QRS Duration:  96 QT Interval:  358 QTC Calculation: 433 R Axis:   97  Text Interpretation: Normal sinus rhythm Rightward axis When compared with ECG of 20-Oct-2022 18:38, PREVIOUS ECG IS PRESENT No STEMI Confirmed by Alvester Chou [phone]) on 01/23/2023 2:32:44 PM  Radiology DG Abdomen 1 View  Result Date: 01/23/2023 CLINICAL DATA:  Nausea and vomiting EXAM: ABDOMEN - 1 VIEW COMPARISON:  X-ray 2007 January.  CT 10/20/2022 FINDINGS: Gas seen in nondilated loops of [place] and large bowel. Mild colonic stool. No obstruction. No obvious free air but the diaphragm is clipped off the edge of the film on this supine radiograph. Left lateral hemi abdominal edges also clipped off the edge of the film. Overlapping cardiac leads. Degenerative changes are identified. IMPRESSION: Nonspecific bowel gas pattern. Electronically Signed   By: Karen Kays M.D.   On: 01/23/2023 18:36   DG Chest 2 View  Result Date: 01/23/2023 CLINICAL DATA:  Shortness of breath. EXAM: CHEST - 2 VIEW COMPARISON:  October 20, 2022. FINDINGS: The heart size and mediastinal contours are within normal limits. Both  lungs are clear. The visualized skeletal structures are unremarkable. IMPRESSION: No active cardiopulmonary disease. Electronically Signed   By: Lupita Raider M.D.   On: 01/23/2023 16:33    Procedures Procedures    Medications Ordered in ED Medications  promethazine (PHENERGAN) 12.5 mg in sodium chloride 0.9 % 50 mL IVPB (has no administration in time range)  pantoprazole (PROTONIX) injection 40 mg (40 mg Intravenous Given 01/23/23 1519)  sodium chloride 0.9 % bolus 500 mL (500 mLs Intravenous New Bag/Given 01/23/23 1517)  ondansetron (ZOFRAN) injection 4 mg (4 mg Intravenous Given 01/23/23 1553)  chlordiazePOXIDE (LIBRIUM) capsule 25 mg (25 mg Oral Given 01/23/23 1700)    ED Course/ Medical Decision Making/ A&P                                 Medical Decision Making Patient is a 66 year old male, here for nausea, weakness, has been going on for the last couple days, states he drinks about 6-12 beers a day, persistently, and that helps him a little bit with his pain.  Denies any blood in his stool.  No shortness of breath that is new.  Was previously taking Protonix, but ran out.  Now is having bad nausea.  We will obtain a chest x-ray KUB, given his diarrhea nausea, vomiting, as well as troponins, for further evaluation.  I am suspicious that his ethanol use, is contributing to this persistent nausea, for possible GERD.  Amount and/or Complexity of Data Reviewed Labs: ordered.    Details: Hyponatremia of 125, ethanol of 50 Radiology: ordered.    Details: Chest x-ray and KUB clear Discussion of management or test interpretation with external provider(s): Discussed with patient, I believe that hyponatremia is secondary to beer potomania, Dr. Eloise Harman discussed with the patient, patient would like to stop drinking, we prescribed Librium, and sent him Zofran for home, as well as pantoprazole.  He has a follow-up appointment, on Monday with his primary care doctor, and will get his repeat labs,  to check.  We discussed limiting his p.o. intake, to 2000 mL day.  We discussed return for precautions, and he voiced understanding.  Labs are reassuring, trops flat, chest x-ray and KUB clear.  Risk Prescription drug management.    Final Clinical Impression(s) / ED Diagnoses Final diagnoses:  Nausea and vomiting, unspecified vomiting type  Epigastric pain  Hyponatremia    Rx / DC Orders ED Discharge Orders          Ordered    ondansetron (ZOFRAN-ODT) 4 MG disintegrating tablet  Every 8 hours PRN        01/23/23 1844    chlordiazePOXIDE (LIBRIUM) 25 MG capsule        01/23/23 1848    pantoprazole (PROTONIX) 40 MG tablet  Daily        01/23/23 1850              Soledad Budreau, [place], PA 01/23/23 1853    Rondel Baton, MD 01/25/23 1014

## 2023-01-23 NOTE — ED Triage Notes (Addendum)
Pt reports nausea for past several days, unresolved by Tums or Prilosec at home.  Pt appears SOB and restless in triage after short walk to car and back.  Pt reports lightheaded and dizzy for past month off and on.    Pt recent diagnosis of lung nodules - seeing pulmonologist on Monday.  AAOx4, NAD in triage.

## 2023-01-26 ENCOUNTER — Emergency Department (HOSPITAL_BASED_OUTPATIENT_CLINIC_OR_DEPARTMENT_OTHER)
Admission: EM | Admit: 2023-01-26 | Discharge: 2023-01-26 | Disposition: A | Discharge status: Home / Self Care | Payer: Medicare Other | Attending: Emergency Medicine | Admitting: Emergency Medicine

## 2023-01-26 ENCOUNTER — Emergency Department (HOSPITAL_BASED_OUTPATIENT_CLINIC_OR_DEPARTMENT_OTHER): Payer: Medicare Other

## 2023-01-26 ENCOUNTER — Institutional Professional Consult (permissible substitution): Payer: Medicare Other | Admitting: Pulmonary Disease

## 2023-01-26 ENCOUNTER — Encounter: Payer: Self-pay | Admitting: Physician Assistant

## 2023-01-26 ENCOUNTER — Ambulatory Visit (INDEPENDENT_AMBULATORY_CARE_PROVIDER_SITE_OTHER): Payer: Medicare Other | Admitting: Physician Assistant

## 2023-01-26 ENCOUNTER — Encounter (HOSPITAL_BASED_OUTPATIENT_CLINIC_OR_DEPARTMENT_OTHER): Payer: Self-pay | Admitting: Pediatrics

## 2023-01-26 ENCOUNTER — Other Ambulatory Visit: Payer: Self-pay

## 2023-01-26 VITALS — BP 134/80 | HR 62 | Temp 97.7°F | Resp 24 | Ht 71.0 in | Wt 164.2 lb

## 2023-01-26 DIAGNOSIS — Z1152 Encounter for screening for COVID-19: Secondary | ICD-10-CM | POA: Insufficient documentation

## 2023-01-26 DIAGNOSIS — R110 Nausea: Secondary | ICD-10-CM | POA: Diagnosis present

## 2023-01-26 DIAGNOSIS — F1010 Alcohol abuse, uncomplicated: Secondary | ICD-10-CM

## 2023-01-26 DIAGNOSIS — F10230 Alcohol dependence with withdrawal, uncomplicated: Secondary | ICD-10-CM | POA: Diagnosis not present

## 2023-01-26 DIAGNOSIS — R109 Unspecified abdominal pain: Secondary | ICD-10-CM | POA: Diagnosis not present

## 2023-01-26 DIAGNOSIS — R251 Tremor, unspecified: Secondary | ICD-10-CM | POA: Diagnosis not present

## 2023-01-26 DIAGNOSIS — G8929 Other chronic pain: Secondary | ICD-10-CM | POA: Insufficient documentation

## 2023-01-26 DIAGNOSIS — J189 Pneumonia, unspecified organism: Secondary | ICD-10-CM | POA: Diagnosis not present

## 2023-01-26 DIAGNOSIS — J181 Lobar pneumonia, unspecified organism: Secondary | ICD-10-CM | POA: Insufficient documentation

## 2023-01-26 DIAGNOSIS — F10939 Alcohol use, unspecified with withdrawal, unspecified: Secondary | ICD-10-CM

## 2023-01-26 DIAGNOSIS — D649 Anemia, unspecified: Secondary | ICD-10-CM | POA: Insufficient documentation

## 2023-01-26 DIAGNOSIS — M47814 Spondylosis without myelopathy or radiculopathy, thoracic region: Secondary | ICD-10-CM | POA: Insufficient documentation

## 2023-01-26 DIAGNOSIS — F10930 Alcohol use, unspecified with withdrawal, uncomplicated: Secondary | ICD-10-CM

## 2023-01-26 DIAGNOSIS — E871 Hypo-osmolality and hyponatremia: Secondary | ICD-10-CM | POA: Insufficient documentation

## 2023-01-26 LAB — CBC
HCT: 36.4 % — ABNORMAL LOW (ref 39.0–52.0)
Hemoglobin: 13 g/dL (ref 13.0–17.0)
MCH: 33.6 pg (ref 26.0–34.0)
MCHC: 35.7 g/dL (ref 30.0–36.0)
MCV: 94.1 fL (ref 80.0–100.0)
Platelets: 261 10*3/uL (ref 150–400)
RBC: 3.87 MIL/uL — ABNORMAL LOW (ref 4.22–5.81)
RDW: 13.1 % (ref 11.5–15.5)
WBC: 8.9 10*3/uL (ref 4.0–10.5)
nRBC: 0 % (ref 0.0–0.2)

## 2023-01-26 LAB — LIPASE, BLOOD: Lipase: 43 U/L (ref 11–51)

## 2023-01-26 LAB — RESP PANEL BY RT-PCR (RSV, FLU A&B, COVID)  RVPGX2
Influenza A by PCR: NEGATIVE
Influenza B by PCR: NEGATIVE
Resp Syncytial Virus by PCR: NEGATIVE
SARS Coronavirus 2 by RT PCR: NEGATIVE

## 2023-01-26 LAB — COMPREHENSIVE METABOLIC PANEL
ALT: 28 U/L (ref 0–44)
AST: 23 U/L (ref 15–41)
Albumin: 4.4 g/dL (ref 3.5–5.0)
Alkaline Phosphatase: 63 U/L (ref 38–126)
Anion gap: 9 (ref 5–15)
BUN: 19 mg/dL (ref 8–23)
CO2: 25 mmol/L (ref 22–32)
Calcium: 9.2 mg/dL (ref 8.9–10.3)
Chloride: 99 mmol/L (ref 98–111)
Creatinine, Ser: 1.4 mg/dL — ABNORMAL HIGH (ref 0.61–1.24)
GFR, Estimated: 55 mL/min — ABNORMAL LOW (ref >60)
Glucose, Bld: 106 mg/dL — ABNORMAL HIGH (ref 70–99)
Potassium: 4.5 mmol/L (ref 3.5–5.1)
Sodium: 133 mmol/L — ABNORMAL LOW (ref 135–145)
Total Bilirubin: 0.9 mg/dL (ref 0.3–1.2)
Total Protein: 7.1 g/dL (ref 6.5–8.1)

## 2023-01-26 LAB — URINALYSIS, ROUTINE W REFLEX MICROSCOPIC
Bilirubin Urine: NEGATIVE
Glucose, UA: NEGATIVE mg/dL
Hgb urine dipstick: NEGATIVE
Ketones, ur: NEGATIVE mg/dL
Leukocytes,Ua: NEGATIVE
Nitrite: NEGATIVE
Protein, ur: NEGATIVE mg/dL
Specific Gravity, Urine: 1.012 (ref 1.005–1.030)
pH: 5.5 (ref 5.0–8.0)

## 2023-01-26 LAB — ETHANOL: Alcohol, Ethyl (B): <10 mg/dL (ref <10)

## 2023-01-26 MED ORDER — ONDANSETRON 4 MG PO TBDP
4.0000 mg | ORAL_TABLET | Freq: Once | ORAL | Status: AC | PRN
  Administered 2023-01-26: 4 mg via ORAL
  Filled 2023-01-26: qty 1

## 2023-01-26 MED ORDER — THIAMINE MONONITRATE 100 MG PO TABS
100.0000 mg | ORAL_TABLET | Freq: Every day | ORAL | Status: DC
  Administered 2023-01-26: 100 mg via ORAL
  Filled 2023-01-26: qty 1

## 2023-01-26 MED ORDER — AMOXICILLIN-POT CLAVULANATE 875-125 MG PO TABS
1.0000 | ORAL_TABLET | Freq: Once | ORAL | Status: AC
  Administered 2023-01-26: 1 via ORAL
  Filled 2023-01-26: qty 1

## 2023-01-26 MED ORDER — THIAMINE HCL 100 MG/ML IJ SOLN: 100.0000 mg | Freq: Every day | INTRAMUSCULAR | Status: DC

## 2023-01-26 MED ORDER — ADULT MULTIVITAMIN W/MINERALS CH
1.0000 | ORAL_TABLET | Freq: Every day | ORAL | Status: DC
  Administered 2023-01-26: 1 via ORAL
  Filled 2023-01-26: qty 1

## 2023-01-26 MED ORDER — KETOROLAC TROMETHAMINE 15 MG/ML IJ SOLN
15.0000 mg | Freq: Once | INTRAMUSCULAR | Status: AC
  Administered 2023-01-26: 15 mg via INTRAVENOUS
  Filled 2023-01-26: qty 1

## 2023-01-26 MED ORDER — FOLIC ACID 1 MG PO TABS
1.0000 mg | ORAL_TABLET | Freq: Every day | ORAL | Status: DC
  Administered 2023-01-26: 1 mg via ORAL
  Filled 2023-01-26: qty 1

## 2023-01-26 MED ORDER — AMOXICILLIN-POT CLAVULANATE 875-125 MG PO TABS
1.0000 | ORAL_TABLET | Freq: Two times a day (BID) | ORAL | 0 refills | Status: AC
Start: 2023-01-26 — End: 2023-01-31

## 2023-01-26 MED ORDER — LACTATED RINGERS IV BOLUS
1000.0000 mL | Freq: Once | INTRAVENOUS | Status: AC
  Administered 2023-01-26: 1000 mL via INTRAVENOUS

## 2023-01-26 NOTE — ED Triage Notes (Signed)
Arrived via EMS from Dr's office, concern for ETOH withdrawal, stated last drink was Friday and was prescribe some medication to titrate over the weekend. C/O weakness and nausea. C/O ongoing chronic back, shoulder and abd pain,

## 2023-01-26 NOTE — Discharge Instructions (Addendum)
You were seen in the ER today for weakness and nausea. You appear to still be withdrawing from alcohol and dehydrated, so you were given fluids and medications which did help. Your chest xray also appears to show some possible pneumonia starting in the right lung. I have started you on Augmentin for this reason. Please follow up with your primary care provider for repeat assessment and return to the ER if symptoms are worsening.
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
## 2023-01-26 NOTE — ED Provider Notes (Signed)
[place] EMERGENCY DEPARTMENT AT [facility] Provider Note   CSN: [id number] Arrival date & time: 01/26/23  1232     History Chief Complaint  Patient presents with   Weakness   Nausea    TACARI REPASS is a 67 y.o. male.  Patient presents to the emergency department concerns of weakness and nausea.  Reports he has a history of alcohol use disorder was sober for extended period of time but relapsed earlier this year.  Currently reports that last drink was 3 days ago on the day he was seen in the emergency department. Has been drinking 9 beers on average daily for the last few months. Patient was discharged home with a Librium taper and has taken this as prescribed.  Currently presents today with concerns of weakness and nausea.  Concerned that he may have overtaken his Librium but does report that he took 3 tablets Monday, to [place] the next day, and 1 tablet the last day.  Appears that he is taking this appropriately.  Was advised to come to the emergency department by primary care provider for evaluation and monitoring.  Denies any seizures or tremulousness.  Denies chest pain, shortness of breath, vomiting, diarrhea, dysuria, hematuria.   Weakness      Home Medications Prior to Admission medications   Medication Sig Start Date End Date Taking? Authorizing Provider  amoxicillin-clavulanate (AUGMENTIN) 875-125 MG tablet Take 1 tablet by mouth 2 (two) times daily for 5 days. 01/26/23 01/31/23 Yes Smitty Knudsen, PA-C  atorvastatin (LIPITOR) 10 MG tablet Take 1 tablet (10 mg total) by mouth at bedtime. Patient not taking: Reported on 01/26/2023 02/21/22   Allwardt, Crist Infante, PA-C  baclofen (LIORESAL) 10 MG tablet TAKE 1 TABLET BY MOUTH THREE TIMES A DAY AS NEEDED FOR MUSCLE SPASM Patient not taking: Reported on 01/26/2023 12/29/22   Allwardt, Crist Infante, PA-C  chlordiazePOXIDE (LIBRIUM) 25 MG capsule 50mg  PO TID x 1D, then 25-50mg  PO BID X 1D, then 25-50mg  PO QD X 1D Patient not  taking: Reported on 01/26/2023 01/23/23   Small, Brooke L, PA  escitalopram (LEXAPRO) 10 MG tablet TAKE 1/2 TAB BY MOUTH WITH BREAKFAST X 1 WEEK, THEN INCREASE TO 1 TAB EVERY DAY AFTER THEN. Patient not taking: Reported on 01/26/2023 01/16/23   Allwardt, Crist Infante, PA-C  ibuprofen (ADVIL) 800 MG tablet Take 800 mg by mouth every 8 (eight) hours as needed for mild pain or moderate pain.    [provider]  lisinopril (ZESTRIL) 20 MG tablet TAKE 1 TABLET BY MOUTH EVERY DAY 01/14/23   Allwardt, Alyssa M, PA-C  omeprazole (PRILOSEC OTC) 20 MG tablet Take 2 tablets (40 mg total) by mouth daily. 08/11/22   Imogene Burn, MD  ondansetron (ZOFRAN-ODT) 4 MG disintegrating tablet Take 1 tablet (4 mg total) by mouth every 8 (eight) hours as needed. Patient not taking: Reported on 01/26/2023 01/23/23   Small, Brooke L, PA  pantoprazole (PROTONIX) 40 MG tablet Take 1 tablet (40 mg total) by mouth daily. 01/23/23   Small, Brooke L, PA  propranolol (INDERAL) 20 MG tablet TAKE ONE TAB BY MOUTH ADMINISTERED 30 TO 60 MINUTES PRIOR TO ANXIETY-PROVOKING SITUATION Patient not taking: Reported on 01/26/2023 01/16/23   Allwardt, Crist Infante, PA-C  sildenafil (VIAGRA) 100 MG tablet Take 1 tablet (100 mg total) by mouth daily as needed for erectile dysfunction. Patient not taking: Reported on 01/26/2023 05/24/21   Allwardt, Crist Infante, PA-C      Allergies  Patient has no known allergies.    Review of Systems   Review of Systems  Neurological:  Positive for weakness.  All other systems reviewed and are negative.   Physical Exam Updated Vital Signs BP (!) 144/76   Pulse 69   Temp 98.1 F (36.7 C) (Oral)   Resp 16   Ht 5\' 11"  (1.803 m)   Wt 74.5 kg   SpO2 99%   BMI 22.90 kg/m  Physical Exam Vitals and nursing note reviewed.  Constitutional:      General: He is not in acute distress.    Appearance: He is well-developed.  HENT:     Head: Normocephalic and atraumatic.  Eyes:     Conjunctiva/sclera: Conjunctivae  normal.  Cardiovascular:     Rate and Rhythm: Normal rate and regular rhythm.     Heart sounds: No murmur heard. Pulmonary:     Effort: Pulmonary effort is normal. No respiratory distress.     Breath sounds: Normal breath sounds.  Abdominal:     Palpations: Abdomen is soft.     Tenderness: There is no abdominal tenderness.  Musculoskeletal:        General: No swelling.     Cervical back: Neck supple.  Skin:    General: Skin is warm and dry.     Capillary Refill: Capillary refill takes less than 2 seconds.  Neurological:     General: No focal deficit present.     Mental Status: He is alert. Mental status is at baseline.     Motor: Tremor present.     Comments: Very mild tremor noted when patient's arms extended.  Psychiatric:        Mood and Affect: Mood normal.     ED Results / Procedures / Treatments   Labs (all labs ordered are listed, but only abnormal results are displayed) Labs Reviewed  COMPREHENSIVE METABOLIC PANEL - Abnormal; Notable for the following components:      Result Value   Sodium 133 (*)    Glucose, Bld 106 (*)    Creatinine, Ser 1.40 (*)    GFR, Estimated 55 (*)    All other components within normal limits  CBC - Abnormal; Notable for the following components:   RBC 3.87 (*)    HCT 36.4 (*)    All other components within normal limits  RESP PANEL BY RT-PCR (RSV, FLU A&B, COVID)  RVPGX2  LIPASE, BLOOD  URINALYSIS, ROUTINE W REFLEX MICROSCOPIC  ETHANOL    EKG None  Radiology DG Chest Portable 1 View  Result Date: 01/26/2023 CLINICAL DATA:  Provided history: Weakness. Nausea. Chronic back, shoulder and abdominal pain. EXAM: PORTABLE CHEST 1 VIEW COMPARISON:  Prior chest radiographs 01/23/2023 and earlier. FINDINGS: Heart size within normal limits. Small triangular opacity within the medial right lung base, new from the prior examination of 01/23/2023. No appreciable airspace consolidation on the left. No evidence of a pleural effusion or  pneumothorax. No acute osseous abnormality identified. Spondylosis and mild dextrocurvature of the thoracic spine. IMPRESSION: Small opacity within the right lung base, new from the prior examination of 01/23/2023. This may reflect atelectasis or aspiration/pneumonia. Electronically Signed   By: Jackey Loge D.O.   On: 01/26/2023 16:40    Procedures Procedures   Medications Ordered in ED Medications  thiamine (VITAMIN B1) tablet 100 mg (100 mg Oral Given 01/26/23 1440)    Or  thiamine (VITAMIN B1) injection 100 mg ( Intravenous See Alternative 01/26/23 1440)  folic acid (FOLVITE) tablet 1  mg (1 mg Oral Given 01/26/23 1441)  multivitamin with minerals tablet 1 tablet (1 tablet Oral Given 01/26/23 1441)  ondansetron (ZOFRAN-ODT) disintegrating tablet 4 mg (4 mg Oral Given 01/26/23 1251)  lactated ringers bolus 1,000 mL (0 mLs Intravenous Stopped 01/26/23 1548)  ketorolac (TORADOL) 15 MG/ML injection 15 mg (15 mg Intravenous Given 01/26/23 1614)  amoxicillin-clavulanate (AUGMENTIN) 875-125 MG per tablet 1 tablet (1 tablet Oral Given 01/26/23 1907)    ED Course/ Medical Decision Making/ A&P Clinical Course as of 01/26/23 2009  Mon Jan 26, 2023  1458 Protein: NEGATIVE [OZ]    Clinical Course User Index [OZ] Smitty Knudsen, PA-C                               Medical Decision Making Amount and/or Complexity of Data Reviewed Labs: ordered. Decision-making details documented in ED Course. Radiology: ordered.  Risk OTC drugs. Prescription drug management.   This patient presents to the ED for concern of weakness, nausea.  Differential diagnosis includes alcohol detox, acute intoxication, COVID-19, pneumonia, arrhythmia   Lab Tests:  I Ordered, and personally interpreted labs.  The pertinent results include: CBC unremarkable but mild anemia likely with decrease in red blood count and hematocrit and hemoglobin borderline low normal, CMP with mild hyponatremia at 133 improved compared to prior  but mild renal dysfunction with elevation in creatinine at 1.4 and GFR of 55.  Ethanol less than 10, respiratory viral panel negative   Imaging Studies ordered:  I ordered imaging studies including chest x-ray I independently visualized and interpreted imaging which showed small consolidation right lung base suspected to be possible aspiration/pneumonia I agree with the radiologist interpretation   Medicines ordered and prescription drug management:  I ordered medication including lactated Ringer's, multivitamin, thiamine, folic acid, Zofran for alcohol withdrawal, nausea Reevaluation of the patient after these medicines showed that the patient improved I have reviewed the patients home medicines and have made adjustments as needed   Problem List / ED Course:  Presents to the emergency department concerns of weakness and nausea.  Reports that he was sent by primary care provider if concern of possible acute alcohol detox.  Patient states that his last alcoholic drink was on Friday, 08/16/2438.  States that he is been drinking approximately 9 beers for the last several months with sudden station of drinking.  Was given Librium taper and taking this as prescribed. Initial CIWA score: @1430  - 5 (nausea, tremor, tactile hallucination) Patient remained stable without any worsening in CIWA score.  Wanted to be discharged home as he reports that he is symptomatically improved with fluids and some medications.  Chest x-ray concerning for possible consolidation in the right lung base which could represent aspiration/pneumonia.  Will initiate antibiotic therapy reduce the risk of complications from this although unclear if this is actually pneumonia as patient has denied any cough, congestion, fever.  Dose of Augmentin given here in the emergency department.  Remainder prescription sent to patient's pharmacy.  Advised patient to follow-up with primary care provider regarding alcohol withdrawal and  management of this.  Patient is planning on going to AA meeting for communal support.  At this time, no indication for medication assisted withdrawal as patient CIWA score is less than 5 and remaining stable without any sudden elevation.  Do not believe the patient is currently delirium tremens.  Patient otherwise stable for discharge home with outpatient follow-up and management.  Provided patient  with resource guide for outpatient counseling for substance abuse. Patient discharged home in stable condition.   Social Determinants of Health:  History of alcohol use disorder  Final Clinical Impression(s) / ED Diagnoses Final diagnoses:  Alcohol withdrawal syndrome without complication (HCC)  Pneumonia of right lower lobe due to infectious organism    Rx / DC Orders ED Discharge Orders          Ordered    amoxicillin-clavulanate (AUGMENTIN) 875-125 MG tablet  2 times daily        01/26/23 1848              Smitty Knudsen, PA-C 01/26/23 2009    Vanetta Mulders, MD 01/30/23 1342

## 2023-01-26 NOTE — ED Notes (Signed)
Reviewed discharge instructions, medications, and home care with pt. Pt verbalized understanding and had no further questions. Pt waiting in lobby for cab. Cab called with ETA of 20 mins. Cab voucher given to registration.

## 2023-02-19 ENCOUNTER — Other Ambulatory Visit: Payer: Self-pay | Admitting: Neurosurgery

## 2023-02-19 DIAGNOSIS — M5412 Radiculopathy, cervical region: Secondary | ICD-10-CM

## 2023-02-25 ENCOUNTER — Ambulatory Visit (INDEPENDENT_AMBULATORY_CARE_PROVIDER_SITE_OTHER): Payer: Medicare Other | Admitting: Emergency Medicine

## 2023-02-25 ENCOUNTER — Encounter: Payer: Self-pay | Admitting: Emergency Medicine

## 2023-02-25 VITALS — BP 110/72 | HR 73 | Temp 98.0°F | Ht 71.0 in | Wt 158.0 lb

## 2023-02-25 DIAGNOSIS — R053 Chronic cough: Secondary | ICD-10-CM | POA: Diagnosis not present

## 2023-02-25 DIAGNOSIS — R911 Solitary pulmonary nodule: Secondary | ICD-10-CM

## 2023-02-25 MED ORDER — IRBESARTAN 150 MG PO TABS: 150.0000 mg | ORAL_TABLET | Freq: Every day | ORAL | 1 refills | Status: DC

## 2023-02-25 NOTE — Assessment & Plan Note (Signed)
For the last year.  Corresponds with his lisinopril use.  Will do a trial off the ACE inhibitor and on ARB to see if he improves.  If so then I have asked him to talk to his primary care provider about a permanent change
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
## 2023-02-25 NOTE — Assessment & Plan Note (Signed)
Very subtle groundglass pulmonary nodule in the mid right upper lobe.  Appears to be new compared with the prior scan 2009 although that scan is difficult to interpret because he had significant infiltrate and pleural disease.  At some risk for malignancy.  No indication for biopsy at this time but will need to be followed with serial imaging.  If it changes, evolves then we would need to consider either bronchoscopy for diagnosis or possible referral for primary resection.

## 2023-02-25 NOTE — Patient Instructions (Addendum)
We reviewed your CT scan of the chest today. We will plan to repeat your CT chest in January 2025. Temporarily stop your lisinopril.  Try starting irbesartan 150 mg once daily.  Discuss this change with your primary care provider.  If you have less cough on the new blood pressure medication then would be reasonable for you to change to irbesartan permanently. Follow with Dr. Delton Coombes in January 2025 after your CT chest so we can review those results together

## 2023-02-26 ENCOUNTER — Telehealth: Payer: Self-pay | Admitting: Emergency Medicine

## 2023-02-26 NOTE — Telephone Encounter (Signed)
Called and spoke to patient. He is aware to stop Lisinopril and try Avapro. He voiced his understanding.  Nothing further needed.

## 2023-02-26 NOTE — Telephone Encounter (Signed)
Patient is trying to call back to return call.

## 2023-02-26 NOTE — Telephone Encounter (Signed)
Patient is wanting to know if he should stop taking his old blood pressure medicine and start taking the new one that was given to him yesterday, irbirstican 150mg . Please call and advise.

## 2023-02-26 NOTE — Telephone Encounter (Signed)
Lm x1 for patient.  

## 2023-03-02 NOTE — Progress Notes (Unsigned)
    Aleen Sells D.[facility] [address] [place] [address] Phone: [phone]   Assessment and Plan:     There are no diagnoses linked to this encounter.  ***   Pertinent previous records reviewed include ***   Follow Up: ***     Subjective:   I, [name], am serving as a Neurosurgeon for Doctor Richardean Sale  Chief Complaint: bilat shoulder and wrist pain   HPI:   03/03/2023 Patient is a 67 year old male complaining of bilat shoulder and wrist pain . Patient states  Relevant Historical Information: ***  Additional pertinent review of systems negative.   Current Outpatient Medications:    baclofen (LIORESAL) 10 MG tablet, TAKE 1 TABLET BY MOUTH THREE TIMES A DAY AS NEEDED FOR MUSCLE SPASM, Disp: 270 tablet, Rfl: 1   ibuprofen (ADVIL) 800 MG tablet, Take 800 mg by mouth every 8 (eight) hours as needed for mild pain or moderate pain., Disp: , Rfl:    irbesartan (AVAPRO) 150 MG tablet, Take 1 tablet (150 mg total) by mouth daily., Disp: 30 tablet, Rfl: 1   lisinopril (ZESTRIL) 20 MG tablet, TAKE 1 TABLET BY MOUTH EVERY DAY, Disp: 90 tablet, Rfl: 0   omeprazole (PRILOSEC OTC) 20 MG tablet, Take 2 tablets (40 mg total) by mouth daily., Disp: 60 tablet, Rfl: 1   pantoprazole (PROTONIX) 40 MG tablet, Take 1 tablet (40 mg total) by mouth daily., Disp: 30 tablet, Rfl: 0   sildenafil (VIAGRA) 100 MG tablet, Take 1 tablet (100 mg total) by mouth daily as needed for erectile dysfunction., Disp: 10 tablet, Rfl: 2   Objective:     There were no vitals filed for this visit.    There is no height or weight on file to calculate BMI.    Physical Exam:    ***   Electronically signed by:  Aleen Sells D.[facility] 4:08 PM 03/02/23

## 2023-03-03 ENCOUNTER — Ambulatory Visit (INDEPENDENT_AMBULATORY_CARE_PROVIDER_SITE_OTHER): Payer: Medicare Other | Admitting: Sports Medicine

## 2023-03-03 VITALS — BP 110/72 | HR 93 | Ht 71.0 in | Wt 158.0 lb

## 2023-03-03 DIAGNOSIS — G8929 Other chronic pain: Secondary | ICD-10-CM

## 2023-03-03 DIAGNOSIS — M542 Cervicalgia: Secondary | ICD-10-CM

## 2023-03-03 DIAGNOSIS — M25512 Pain in left shoulder: Secondary | ICD-10-CM

## 2023-03-03 DIAGNOSIS — Z9889 Other specified postprocedural states: Secondary | ICD-10-CM

## 2023-03-03 DIAGNOSIS — M25511 Pain in right shoulder: Secondary | ICD-10-CM | POA: Diagnosis not present

## 2023-03-03 NOTE — Patient Instructions (Signed)
Shoulder and neck HEP  As needed follow up

## 2023-03-14 ENCOUNTER — Other Ambulatory Visit: Payer: Medicare PPO

## 2023-03-14 ENCOUNTER — Inpatient Hospital Stay
Admission: RE | Admit: 2023-03-14 | Discharge: 2023-03-14 | Discharge status: Home / Self Care | Payer: Medicare PPO | Source: Ambulatory Visit | Attending: Neurosurgery | Admitting: Neurosurgery

## 2023-03-14 DIAGNOSIS — M5412 Radiculopathy, cervical region: Secondary | ICD-10-CM

## 2023-03-14 DIAGNOSIS — M47812 Spondylosis without myelopathy or radiculopathy, cervical region: Secondary | ICD-10-CM | POA: Diagnosis not present

## 2023-03-14 DIAGNOSIS — Z981 Arthrodesis status: Secondary | ICD-10-CM | POA: Diagnosis not present

## 2023-03-14 DIAGNOSIS — M5030 Other cervical disc degeneration, unspecified cervical region: Secondary | ICD-10-CM | POA: Diagnosis not present

## 2023-03-14 DIAGNOSIS — M9971 Connective tissue and disc stenosis of intervertebral foramina of cervical region: Secondary | ICD-10-CM | POA: Diagnosis not present

## 2023-03-20 ENCOUNTER — Other Ambulatory Visit: Payer: Self-pay | Admitting: Emergency Medicine

## 2023-04-06 ENCOUNTER — Ambulatory Visit: Payer: Medicare Other | Admitting: Physician Assistant

## 2023-04-06 ENCOUNTER — Encounter: Payer: Self-pay | Admitting: Physician Assistant

## 2023-04-13 ENCOUNTER — Ambulatory Visit (INDEPENDENT_AMBULATORY_CARE_PROVIDER_SITE_OTHER): Payer: Medicare PPO | Admitting: Physician Assistant

## 2023-04-13 ENCOUNTER — Other Ambulatory Visit: Payer: Self-pay | Admitting: Physician Assistant

## 2023-04-13 VITALS — BP 130/82 | HR 68 | Temp 97.7°F | Resp 16 | Ht 71.0 in | Wt 161.0 lb

## 2023-04-13 DIAGNOSIS — G4709 Other insomnia: Secondary | ICD-10-CM | POA: Diagnosis not present

## 2023-04-13 DIAGNOSIS — F1010 Alcohol abuse, uncomplicated: Secondary | ICD-10-CM

## 2023-04-13 DIAGNOSIS — F32A Depression, unspecified: Secondary | ICD-10-CM | POA: Diagnosis not present

## 2023-04-13 DIAGNOSIS — F419 Anxiety disorder, unspecified: Secondary | ICD-10-CM

## 2023-04-13 DIAGNOSIS — G8929 Other chronic pain: Secondary | ICD-10-CM | POA: Diagnosis not present

## 2023-04-13 DIAGNOSIS — R053 Chronic cough: Secondary | ICD-10-CM

## 2023-04-13 MED ORDER — TRAZODONE HCL 50 MG PO TABS
50.0000 mg | ORAL_TABLET | Freq: Every day | ORAL | 2 refills | Status: DC
Start: 2023-04-13 — End: 2023-05-07
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
## 2023-05-01 DIAGNOSIS — Z1339 Encounter for screening examination for other mental health and behavioral disorders: Secondary | ICD-10-CM | POA: Diagnosis not present

## 2023-05-01 DIAGNOSIS — F4310 Post-traumatic stress disorder, unspecified: Secondary | ICD-10-CM | POA: Diagnosis not present

## 2023-05-01 DIAGNOSIS — F411 Generalized anxiety disorder: Secondary | ICD-10-CM | POA: Diagnosis not present

## 2023-05-01 DIAGNOSIS — Z6822 Body mass index (BMI) 22.0-22.9, adult: Secondary | ICD-10-CM | POA: Diagnosis not present

## 2023-05-01 DIAGNOSIS — F331 Major depressive disorder, recurrent, moderate: Secondary | ICD-10-CM | POA: Diagnosis not present

## 2023-05-06 ENCOUNTER — Other Ambulatory Visit: Payer: Self-pay | Admitting: Physician Assistant

## 2023-05-06 DIAGNOSIS — G4709 Other insomnia: Secondary | ICD-10-CM

## 2023-05-07 DIAGNOSIS — G5603 Carpal tunnel syndrome, bilateral upper limbs: Secondary | ICD-10-CM | POA: Diagnosis not present

## 2023-05-19 ENCOUNTER — Ambulatory Visit (INDEPENDENT_AMBULATORY_CARE_PROVIDER_SITE_OTHER): Payer: Medicare PPO

## 2023-05-19 VITALS — BP 130/80 | Temp 98.2°F | Wt 160.8 lb

## 2023-05-19 DIAGNOSIS — Z0000 Encounter for general adult medical examination without abnormal findings: Secondary | ICD-10-CM
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
## 2023-05-19 NOTE — Patient Instructions (Signed)
 [name] , Thank you for taking time to come for your Medicare Wellness Visit. I appreciate your ongoing commitment to your health goals. Please review the following plan we discussed and let me know if I can assist you in the future.   Referrals/Orders/Follow-Ups/Clinician Recommendations: Aim for 30 minutes of exercise or brisk walking, 6-8 glasses of water, and 5 servings of fruits and vegetables each day. Referral: PCP to follow up with [facility]   This is a list of the screening recommended for you and due dates:  Health Maintenance  Topic Date Due   DTaP/Tdap/Td vaccine (1 - Tdap) Never done   Zoster (Shingles) Vaccine (1 of 2) Never done   Pneumonia Vaccine (1 of 1 - PCV) Never done   Flu Shot  12/04/2022   COVID-19 Vaccine (4 - 2024-25 season) 01/04/2023   Medicare Annual Wellness Visit  05/14/2023   Colon Cancer Screening  08/10/2032   Hepatitis C Screening  Completed   HPV Vaccine  Aged Out    Advanced directives: (Provided) Advance directive discussed with you today. I have provided a copy for you to complete at home and have notarized. Once this is complete, please bring a copy in to our office so we can scan it into your chart.   Next Medicare Annual Wellness Visit scheduled for next year: Yes

## 2023-05-28 ENCOUNTER — Other Ambulatory Visit: Payer: Medicare Other

## 2023-05-31 ENCOUNTER — Other Ambulatory Visit: Payer: Self-pay

## 2023-05-31 ENCOUNTER — Emergency Department (HOSPITAL_BASED_OUTPATIENT_CLINIC_OR_DEPARTMENT_OTHER)
Admission: EM | Admit: 2023-05-31 | Discharge: 2023-05-31 | Disposition: A | Discharge status: Home / Self Care | Payer: Medicare PPO | Attending: Emergency Medicine | Admitting: Emergency Medicine

## 2023-05-31 ENCOUNTER — Encounter (HOSPITAL_BASED_OUTPATIENT_CLINIC_OR_DEPARTMENT_OTHER): Payer: Self-pay | Admitting: Emergency Medicine

## 2023-05-31 DIAGNOSIS — T148XXA Other injury of unspecified body region, initial encounter: Secondary | ICD-10-CM

## 2023-05-31 DIAGNOSIS — X58XXXA Exposure to other specified factors, initial encounter: Secondary | ICD-10-CM | POA: Insufficient documentation

## 2023-05-31 DIAGNOSIS — S59911A Unspecified injury of right forearm, initial encounter: Secondary | ICD-10-CM | POA: Diagnosis present

## 2023-05-31 DIAGNOSIS — S5011XA Contusion of right forearm, initial encounter: Secondary | ICD-10-CM | POA: Diagnosis not present

## 2023-05-31 NOTE — Discharge Instructions (Signed)
Return with worsening pain, redness, swelling, streaking redness up the arm.  Use ice for the next 24 hours, 15 minutes every hour and then use warmth after that time.

## 2023-05-31 NOTE — ED Notes (Signed)
Provider bedside.

## 2023-05-31 NOTE — ED Triage Notes (Signed)
Pt reports that he donated plasma yesterday morning and then he noticed a bruise on his right arm.  Pt states he feels fatigued today.

## 2023-05-31 NOTE — ED Provider Notes (Signed)
[facility] EMERGENCY DEPARTMENT AT [facility] Provider Note   CSN: [id number] Arrival date & time: 05/31/23  1859     History  Chief Complaint  Patient presents with   bruise to arm    [name] is a 68 y.o. male.  Patient presents to the emergency department for evaluation of right arm hematoma.  Patient gives plasma regularly.  He donated yesterday.  He developed bruising in the antecubital area.  He has a mild soreness.  No redness or purulent drainage.  He was concerned that he was developing an infection as this type of bruising does not typically happen.  He does report daily ibuprofen use.  No anticoagulation.  No fevers.       Home Medications Prior to Admission medications   Medication Sig Start Date End Date Taking? Authorizing Provider  baclofen (LIORESAL) 10 MG tablet TAKE 1 TABLET BY MOUTH THREE TIMES A DAY AS NEEDED FOR MUSCLE SPASM 12/29/22   Allwardt, Alyssa M, PA-C  ibuprofen (ADVIL) 800 MG tablet Take 800 mg by mouth every 8 (eight) hours as needed for mild pain or moderate pain.    [provider]  irbesartan (AVAPRO) 150 MG tablet TAKE 1 TABLET BY MOUTH EVERY DAY 03/20/23   Leslye Peer, MD  omeprazole (PRILOSEC OTC) 20 MG tablet Take 2 tablets (40 mg total) by mouth daily. 08/11/22   Imogene Burn, MD  pantoprazole (PROTONIX) 40 MG tablet Take 1 tablet (40 mg total) by mouth daily. 01/23/23   Small, Brooke L, PA  sildenafil (VIAGRA) 100 MG tablet Take 1 tablet (100 mg total) by mouth daily as needed for erectile dysfunction. 05/24/21   Allwardt, Crist Infante, PA-C  traZODone (DESYREL) 50 MG tablet TAKE 1 TABLET BY MOUTH EVERYDAY AT BEDTIME 05/07/23   Allwardt, Alyssa M, PA-C      Allergies    Patient has no known allergies.    Review of Systems   Review of Systems  Physical Exam Updated Vital Signs BP (!) 150/94 (BP Location: Left Arm)   Pulse 77   Temp 97.9 F (36.6 C)   Resp 18   Ht 5\' 11"  (1.803 m)   Wt 72.9 kg   SpO2 98%    BMI 22.42 kg/m  Physical Exam Vitals and nursing note reviewed.  Constitutional:      Appearance: He is well-developed.  HENT:     Head: Normocephalic and atraumatic.  Eyes:     Conjunctiva/sclera: Conjunctivae normal.  Pulmonary:     Effort: No respiratory distress.  Musculoskeletal:     Cervical back: Normal range of motion and neck supple.  Skin:    General: Skin is warm and dry.     Comments: Patient with bruising on the proximal right forearm.  The vein is a bit prominent with some mild thrombophlebitis.  No erythema, warmth, drainage.  Radial pulse 2+ distally.  Neurological:     Mental Status: He is alert.     ED Results / Procedures / Treatments   Labs (all labs ordered are listed, but only abnormal results are displayed) Labs Reviewed - No data to display  EKG None  Radiology No results found.  Procedures Procedures    Medications Ordered in ED Medications - No data to display  ED Course/ Medical Decision Making/ A&P    Patient seen and examined in triage. History obtained directly from patient.     Labs/EKG: None ordered  Imaging: None ordered  Medications/Fluids: None ordered  Most recent vital signs reviewed and are as follows: BP (!) 150/94 (BP Location: Left Arm)   Pulse 77   Temp 97.9 F (36.6 C)   Resp 18   Ht 5\' 11"  (1.803 m)   Wt 72.9 kg   SpO2 98%   BMI 22.42 kg/m   Initial impression: Ecchymosis/hematoma, possibly mild thrombophlebitis associated with recent phlebotomy  Home treatment plan: Ice/cool compresses over the next 24 hours and then use heat as needed  Return instructions discussed with patient: Worsening red, pain, redness, swelling  Follow-up instructions discussed with patient: PCP as needed                                Medical Decision Making  Patient with ecchymosis after donating plasma yesterday.  This was an unusual reaction given extent of ecchymosis.  He is not anticoagulated.  No concern for upper  extremity DVT.  No active bleeding.  No signs of cellulitis.  Patient was concerned about an infection.  He seems reassured.        Final Clinical Impression(s) / ED Diagnoses Final diagnoses:  Hematoma    Rx / DC Orders ED Discharge Orders     None         Renne Crigler, Cordelia Poche 05/31/23 1940    Charlynne Pander, MD 05/31/23 [id number]

## 2023-06-03 ENCOUNTER — Telehealth: Payer: Self-pay | Admitting: Physician Assistant

## 2023-06-03 DIAGNOSIS — G5603 Carpal tunnel syndrome, bilateral upper limbs: Secondary | ICD-10-CM | POA: Diagnosis not present

## 2023-06-03 NOTE — Telephone Encounter (Unsigned)
Copied from CRM [id number]. Topic: Referral - Question >> Jun 03, 2023  2:18 PM Sonny Dandy B wrote: Reason for CRM: pt is requesting another referral to neuro surgery provider, pt was seen at [facility] neuro surgery, is requesting a second opinion. Pt is in comfortable with the Providers finding. Please call pt back at [phone]

## 2023-06-04 ENCOUNTER — Other Ambulatory Visit: Payer: Self-pay

## 2023-06-04 DIAGNOSIS — M25511 Pain in right shoulder: Secondary | ICD-10-CM

## 2023-06-04 DIAGNOSIS — M542 Cervicalgia: Secondary | ICD-10-CM

## 2023-06-04 NOTE — Telephone Encounter (Signed)
Ok to place a new referral to Neurosurgery for second opinion?

## 2023-06-04 NOTE — Telephone Encounter (Signed)
Per patient requested another referral be placed for Neurosurgery for a second opinion. Orders placed and called pt to advise referral order sent

## 2023-06-30 ENCOUNTER — Encounter: Payer: Self-pay | Admitting: Emergency Medicine

## 2023-06-30 ENCOUNTER — Ambulatory Visit: Payer: Self-pay | Admitting: Physician Assistant

## 2023-06-30 ENCOUNTER — Ambulatory Visit
Admission: EM | Admit: 2023-06-30 | Discharge: 2023-06-30 | Disposition: A | Discharge status: Home / Self Care | Payer: Medicare HMO | Attending: Emergency Medicine | Admitting: Emergency Medicine

## 2023-06-30 DIAGNOSIS — M26621 Arthralgia of right temporomandibular joint: Secondary | ICD-10-CM

## 2023-06-30 MED ORDER — CYCLOBENZAPRINE HCL 10 MG PO TABS: 10.0000 mg | ORAL_TABLET | Freq: Two times a day (BID) | ORAL | 0 refills | Status: DC | PRN

## 2023-06-30 NOTE — Telephone Encounter (Signed)
 Noted and agreed, thank you.

## 2023-06-30 NOTE — Discharge Instructions (Signed)
 It appears you are having pain of your temporomandibular joint.  Use warm compresses, 800 mg of ibuprofen every 8 hours and trial the muscle relaxer at night.  If this causes too much drowsiness or sedation you can cut it in half to 5 mg nightly.  You may have arthritis due to your multiple jaw surgeries and previous trauma, please follow-up with a specialist for further investigation into this pain.  Seek immediate care for any chest pain or shortness of breath at the nearest emergency department for further advanced evaluation.

## 2023-06-30 NOTE — Telephone Encounter (Signed)
 Please see triage note; pt advised to go to [facility]
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
## 2023-06-30 NOTE — ED Triage Notes (Signed)
 Patient states that he had an old injury to his right jaw from years ago, then yesterday he tried to eat and unable to open his mouth.  Having pain in his upper right jaw that is radiating down mouth.  Patient taken Ibuprofen 800mg .
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
## 2023-07-03 ENCOUNTER — Other Ambulatory Visit: Payer: Self-pay | Admitting: Physician Assistant

## 2023-07-03 DIAGNOSIS — G4709 Other insomnia: Secondary | ICD-10-CM
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
## 2023-07-13 ENCOUNTER — Encounter: Payer: Self-pay | Admitting: Neurosurgery

## 2023-07-13 ENCOUNTER — Ambulatory Visit (INDEPENDENT_AMBULATORY_CARE_PROVIDER_SITE_OTHER): Admitting: Neurosurgery

## 2023-07-13 ENCOUNTER — Ambulatory Visit: Payer: Medicare HMO | Admitting: Physician Assistant

## 2023-07-13 ENCOUNTER — Ambulatory Visit
Admission: RE | Admit: 2023-07-13 | Discharge: 2023-07-13 | Disposition: A | Discharge status: Home / Self Care | Source: Ambulatory Visit | Attending: Neurosurgery | Admitting: Neurosurgery

## 2023-07-13 ENCOUNTER — Ambulatory Visit
Admission: RE | Admit: 2023-07-13 | Discharge: 2023-07-13 | Disposition: A | Discharge status: Home / Self Care | Attending: Neurosurgery | Admitting: Neurosurgery

## 2023-07-13 ENCOUNTER — Ambulatory Visit (INDEPENDENT_AMBULATORY_CARE_PROVIDER_SITE_OTHER): Payer: Medicare PPO | Admitting: Physician Assistant

## 2023-07-13 VITALS — BP 144/76 | HR 69 | Temp 97.5°F | Ht 71.0 in | Wt 154.6 lb

## 2023-07-13 VITALS — BP 132/68 | Ht 71.0 in | Wt 154.8 lb

## 2023-07-13 DIAGNOSIS — F1021 Alcohol dependence, in remission: Secondary | ICD-10-CM

## 2023-07-13 DIAGNOSIS — G5603 Carpal tunnel syndrome, bilateral upper limbs: Secondary | ICD-10-CM | POA: Insufficient documentation

## 2023-07-13 DIAGNOSIS — T84498A Other mechanical complication of other internal orthopedic devices, implants and grafts, initial encounter: Secondary | ICD-10-CM

## 2023-07-13 DIAGNOSIS — M4802 Spinal stenosis, cervical region: Secondary | ICD-10-CM | POA: Diagnosis not present

## 2023-07-13 DIAGNOSIS — M26629 Arthralgia of temporomandibular joint, unspecified side: Secondary | ICD-10-CM

## 2023-07-13 DIAGNOSIS — M4322 Fusion of spine, cervical region: Secondary | ICD-10-CM | POA: Insufficient documentation

## 2023-07-13 DIAGNOSIS — M47812 Spondylosis without myelopathy or radiculopathy, cervical region: Secondary | ICD-10-CM | POA: Diagnosis not present

## 2023-07-13 DIAGNOSIS — F419 Anxiety disorder, unspecified: Secondary | ICD-10-CM | POA: Diagnosis not present

## 2023-07-13 DIAGNOSIS — F32A Depression, unspecified: Secondary | ICD-10-CM | POA: Diagnosis not present

## 2023-07-13 DIAGNOSIS — Z981 Arthrodesis status: Secondary | ICD-10-CM | POA: Diagnosis not present

## 2023-07-13 DIAGNOSIS — M5030 Other cervical disc degeneration, unspecified cervical region: Secondary | ICD-10-CM | POA: Diagnosis not present
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
## 2023-07-13 NOTE — Patient Instructions (Signed)
 Please see below for information in regards to your upcoming surgery:   Planned surgery: left ultrasound guided carpal tunnel release   Surgery date: (3/27 is available or any Thursday after that - call or send mychart message when ready to schedule) at [facility]: [address] [place], [place] [address]) - you will find out your arrival time the business day before your surgery.   Pre-op appointment at [facility] Pre-admit Testing: we will call you with a date/time for this. If you are scheduled for an in person appointment, Pre-admit Testing is located on the first floor of the [facility] building, [address], [address]. Please bring all prescriptions in the original prescription bottles to your appointment. During this appointment, they will advise you which medications you can take the morning of surgery, and which medications you will need to hold for surgery. Labs (such as blood work, EKG) may be done at your pre-op appointment. You are not required to fast for these labs. Should you need to change your pre-op appointment, please call Pre-admit testing at [phone].     How to contact us:  If you have any questions/concerns before or after surgery, you can reach [place] at [phone], or you can send a mychart message. We can be reached by phone or mychart 8am-4pm, Monday-Friday.  *Please note: Calls after 4pm are forwarded to a third party answering service. Mychart messages are not routinely monitored during evenings, weekends, and holidays. Please call our office to contact the answering service for urgent concerns during non-business hours.    If you have FMLA/disability paperwork, please drop it off or fax it to [phone], attention Patty.   Appointments/FMLA & disability paperwork: Joycelyn Rua, & Flonnie Hailstone Registered Nurse/Surgery scheduler: Royston Cowper Medical Assistants: Nash Mantis Physician Assistants: Joan Flores, PA-C, Manning Charity, PA-C & Drake Leach, PA-C Surgeons: Venetia Night, MD & Ernestine Mcmurray, MD

## 2023-07-21 ENCOUNTER — Telehealth: Payer: Self-pay

## 2023-07-21 ENCOUNTER — Other Ambulatory Visit: Payer: Self-pay

## 2023-07-21 DIAGNOSIS — Z01818 Encounter for other preprocedural examination: Secondary | ICD-10-CM

## 2023-07-21 NOTE — Telephone Encounter (Signed)
 [name] called in to schedule surgery for 08/27/23. We previously discussed surgery related instructions at his office visit on 07/13/23.

## 2023-08-08 ENCOUNTER — Other Ambulatory Visit: Payer: Self-pay | Admitting: Physician Assistant

## 2023-08-10 ENCOUNTER — Telehealth: Payer: Self-pay | Admitting: Physician Assistant

## 2023-08-10 NOTE — Telephone Encounter (Unsigned)
 Copied from CRM [id number]. Topic: Referral - Question >> Aug 10, 2023 12:07 PM [place] E wrote: Reason for CRM: Patient states he has a referral for Psychology, and is questioning if his insurance will cover that visit or if there will be a copay. Callback number for patient is [phone].

## 2023-08-10 NOTE — Telephone Encounter (Signed)
 Returned pt call and confirmed appt date and time; advised pt to contact insurance directly for plan specifics, copays etc. Pt verbalized understanding

## 2023-08-12 ENCOUNTER — Ambulatory Visit: Admitting: Behavioral Health

## 2023-08-12 NOTE — Progress Notes (Unsigned)
   Deneise Lever, LMFT

## 2023-08-13 ENCOUNTER — Other Ambulatory Visit: Payer: Self-pay | Admitting: Physician Assistant

## 2023-08-13 ENCOUNTER — Other Ambulatory Visit: Payer: Self-pay

## 2023-08-13 ENCOUNTER — Telehealth: Payer: Self-pay | Admitting: Physician Assistant

## 2023-08-13 DIAGNOSIS — G4709 Other insomnia: Secondary | ICD-10-CM

## 2023-08-13 MED ORDER — TRAZODONE HCL 100 MG PO TABS: 100.0000 mg | ORAL_TABLET | Freq: Every day | ORAL | 0 refills | Status: DC

## 2023-08-13 NOTE — Telephone Encounter (Signed)
 Copied from CRM [id number]. Topic: Clinical - Prescription Issue >> Aug 13, 2023 11:43 AM [place] J wrote: Reason for CRM: Patient stated that he has been taking 2 traZODone (DESYREL) 50 MG which is why he is out of medication and needs a refill. He wanted to see if we would be able to override the rejection on original request and maybe even up the dose so he doesn't have to take 2 50mg  tablets moving forward.

## 2023-08-13 NOTE — Telephone Encounter (Signed)
 Please see pt request and advise

## 2023-08-13 NOTE — Telephone Encounter (Signed)
Rx sent to pharmacy for refill

## 2023-08-19 ENCOUNTER — Inpatient Hospital Stay
Admission: RE | Admit: 2023-08-19 | Discharge: 2023-08-19 | Disposition: A | Discharge status: Home / Self Care | Source: Ambulatory Visit

## 2023-08-19 NOTE — Patient Instructions (Addendum)
 Your procedure is scheduled on: Thursday April 24 Report to the Registration Desk on the 1st floor of the [facility] To find out your arrival time, please call [phone] between 1PM - 3PM on:Wednesday April 23 If your arrival time is 6:00 am, do not arrive before that time as the [facility] entrance doors do not open until 6:00 am.  REMEMBER: Instructions that are not followed completely may result in serious medical risk, up to and including death; or upon the discretion of your surgeon and anesthesiologist your surgery may need to be rescheduled.  Do not eat food after midnight the night before surgery.  No gum chewing or hard candies.  You may however, drink CLEAR liquids up to 2 hours before you are scheduled to arrive for your surgery. Do not drink anything within 2 hours of your scheduled arrival time.  Clear liquids include: - water  - apple juice without pulp - gatorade (not RED colors) - black coffee or tea (Do NOT add milk or creamers to the coffee or tea) Do NOT drink anything that is not on this list.  One week prior to surgery: Stop Anti-inflammatories (NSAIDS) such as Advil, Aleve, Ibuprofen, Motrin, Naproxen, Naprosyn and Aspirin based products such as Excedrin, Goody's Powder, BC Powder. Stop ANY OVER THE COUNTER supplements until after surgery.  You may however, continue to take Tylenol if needed for pain up until the day of surgery.  Continue taking all of your other prescription medications up until the day before surgery.  DO Not take sildenafil (VIAGRA) 100 MG 2 days before surgery. (Do not take this after Monday 08/24/23)  ON THE DAY OF SURGERY ONLY TAKE THESE MEDICATIONS WITH SIPS OF WATER:  baclofen (LIORESAL) 10 MG  (if needed)   No Alcohol for 24 hours before or after surgery.  No Smoking including e-cigarettes for 24 hours before surgery.  No chewable tobacco products for at least 6 hours before surgery.  No nicotine patches on the day of  surgery.  Do not use any "recreational" drugs for at least a week (preferably 2 weeks) before your surgery.  Please be advised that the combination of cocaine and anesthesia may have negative outcomes, up to and including death. If you test positive for cocaine, your surgery will be cancelled.  On the morning of surgery brush your teeth with toothpaste and water, you may rinse your mouth with mouthwash if you wish. Do not swallow any toothpaste or mouthwash.  Use CHG Soap as directed on instruction sheet.  Do not wear jewelry, make-up, hairpins, clips or nail polish.  For welded (permanent) jewelry: bracelets, anklets, waist bands, etc.  Please have this removed prior to surgery.  If it is not removed, there is a chance that hospital personnel will need to cut it off on the day of surgery.  Do not wear lotions, powders, or perfumes.   Do not shave body hair from the neck down 48 hours before surgery.  Contact lenses, hearing aids and dentures may not be worn into surgery.  Do not bring valuables to the hospital. [facility] is not responsible for any missing/lost belongings or valuables.   Notify your doctor if there is any change in your medical condition (cold, fever, infection).  Wear comfortable clothing (specific to your surgery type) to the hospital.  After surgery, you can help prevent lung complications by doing breathing exercises.  Take deep breaths and cough every 1-2 hours. Your doctor may order a device called an  Incentive Spirometer to help you take deep breaths.  If you are being discharged the day of surgery, you will not be allowed to drive home. You will need a responsible individual to drive you home and stay with you for 24 hours after surgery.   If you are taking public transportation, you will need to have a responsible individual with you.  Please call the Pre-admissions Testing Dept. at [phone] if you have any questions about these  instructions.  Surgery Visitation Policy:  Patients having surgery or a procedure may have two visitors.  Children under the age of 65 must have an adult with them who is not the patient.      Preparing for Surgery with CHLORHEXIDINE GLUCONATE (CHG) Soap  Chlorhexidine Gluconate (CHG) Soap  o An antiseptic cleaner that kills germs and bonds with the skin to continue killing germs even after washing  o Used for showering the night before surgery and morning of surgery  Before surgery, you can play an important role by reducing the number of germs on your skin.  CHG (Chlorhexidine gluconate) soap is an antiseptic cleanser which kills germs and bonds with the skin to continue killing germs even after washing.  Please do not use if you have an allergy to CHG or antibacterial soaps. If your skin becomes reddened/irritated stop using the CHG.  1. Shower the NIGHT BEFORE SURGERY and the MORNING OF SURGERY with CHG soap.  2. If you choose to wash your hair, wash your hair first as usual with your normal shampoo.  3. After shampooing, rinse your hair and body thoroughly to remove the shampoo.  4. Use CHG as you would any other liquid soap. You can apply CHG directly to the skin and wash gently with a scrungie or a clean washcloth.  5. Apply the CHG soap to your body only from the neck down. Do not use on open wounds or open sores. Avoid contact with your eyes, ears, mouth, and genitals (private parts). Wash face and genitals (private parts) with your normal soap.  6. Wash thoroughly, paying special attention to the area where your surgery will be performed.  7. Thoroughly rinse your body with warm water.  8. Do not shower/wash with your normal soap after using and rinsing off the CHG soap.  9. Pat yourself dry with a clean towel.  10. Wear clean pajamas to bed the night before surgery.  12. Place clean sheets on your bed the night of your first shower and do not sleep with  pets.  13. Shower again with the CHG soap on the day of surgery prior to arriving at the hospital.  14. Do not apply any deodorants/lotions/powders.  15. Please wear clean clothes to the hospital.

## 2023-08-25 ENCOUNTER — Encounter: Payer: Self-pay | Admitting: Urgent Care

## 2023-08-25 ENCOUNTER — Telehealth: Payer: Self-pay

## 2023-08-25 ENCOUNTER — Inpatient Hospital Stay
Admission: RE | Admit: 2023-08-25 | Discharge: 2023-08-25 | Disposition: A | Discharge status: Home / Self Care | Source: Ambulatory Visit

## 2023-08-25 NOTE — Patient Instructions (Signed)
 Your procedure is scheduled on: 08/27/23 - Thursday Report to the Registration Desk on the 1st floor of the [facility]. To find out your arrival time, please call [phone] between 1PM - 3PM on: 08/26/23 - Wednesday If your arrival time is 6:00 am, do not arrive before that time as the Medical Mall entrance doors do not open until 6:00 am.  REMEMBER: Instructions that are not followed completely may result in serious medical risk, up to and including death; or upon the discretion of your surgeon and anesthesiologist your surgery may need to be rescheduled.  Do not eat food after midnight the night before surgery.  No gum chewing or hard candies.  You may however, drink CLEAR liquids up to 2 hours before you are scheduled to arrive for your surgery. Do not drink anything within 2 hours of your scheduled arrival time.  Clear liquids include: - water  - apple juice without pulp - gatorade (not RED colors) - black coffee or tea (Do NOT add milk or creamers to the coffee or tea) Do NOT drink anything that is not on this list.  One week prior to surgery: Stop Anti-inflammatories (NSAIDS) such as Advil , Aleve , Ibuprofen , Motrin , Naproxen , Naprosyn  and Aspirin based products such as Excedrin, Goody's Powder, BC Powder.You may take Tylenol  if needed for pain up until the day of surgery.  Stop ANY OVER THE COUNTER supplements until after surgery.  ON THE DAY OF SURGERY ONLY TAKE THESE MEDICATIONS WITH SIPS OF WATER:  none  No Alcohol  for 24 hours before or after surgery.  No Smoking including e-cigarettes for 24 hours before surgery.  No chewable tobacco products for at least 6 hours before surgery.  No nicotine patches on the day of surgery.  Do not use any "recreational" drugs for at least a week (preferably 2 weeks) before your surgery.  Please be advised that the combination of cocaine and anesthesia may have negative outcomes, up to and including death. If you test positive for  cocaine, your surgery will be cancelled.  On the morning of surgery brush your teeth with toothpaste and water, you may rinse your mouth with mouthwash if you wish. Do not swallow any toothpaste or mouthwash.  Use CHG Soap or wipes as directed on instruction sheet.  Do not wear jewelry, make-up, hairpins, clips or nail polish.  For welded (permanent) jewelry: bracelets, anklets, waist bands, etc.  Please have this removed prior to surgery.  If it is not removed, there is a chance that hospital personnel will need to cut it off on the day of surgery.  Do not wear lotions, powders, or perfumes.   Do not shave body hair from the neck down 48 hours before surgery.  Contact lenses, hearing aids and dentures may not be worn into surgery.  Do not bring valuables to the hospital. [facility] is not responsible for any missing/lost belongings or valuables.   Notify your doctor if there is any change in your medical condition (cold, fever, infection).  Wear comfortable clothing (specific to your surgery type) to the hospital.  After surgery, you can help prevent lung complications by doing breathing exercises.  Take deep breaths and cough every 1-2 hours. Your doctor may order a device called an Incentive Spirometer to help you take deep breaths. When coughing or sneezing, hold a pillow firmly against your incision with both hands. This is called "splinting." Doing this helps protect your incision. It also decreases belly discomfort.  If you are being  admitted to the hospital overnight, leave your suitcase in the car. After surgery it may be brought to your room.  In case of increased patient census, it may be necessary for you, the patient, to continue your postoperative care in the Same Day Surgery department.  If you are being discharged the day of surgery, you will not be allowed to drive home. You will need a responsible individual to drive you home and stay with you for 24 hours after  surgery.   If you are taking public transportation, you will need to have a responsible individual with you.  Please call the Pre-admissions Testing Dept. at [phone] if you have any questions about these instructions.  Surgery Visitation Policy:  Patients having surgery or a procedure may have two visitors.  Children under the age of 76 must have an adult with them who is not the patient.  Inpatient Visitation:    Visiting hours are 7 a.m. to 8 p.m. Up to four visitors are allowed at one time in a patient room. The visitors may rotate out with other people during the day.  One visitor age 5 or older may stay with the patient overnight and must be in the room by 8 p.m.     Preparing for Surgery with CHLORHEXIDINE GLUCONATE (CHG) Soap  Chlorhexidine Gluconate (CHG) Soap  o An antiseptic cleaner that kills germs and bonds with the skin to continue killing germs even after washing  o Used for showering the night before surgery and morning of surgery  Before surgery, you can play an important role by reducing the number of germs on your skin.  CHG (Chlorhexidine gluconate) soap is an antiseptic cleanser which kills germs and bonds with the skin to continue killing germs even after washing.  Please do not use if you have an allergy to CHG or antibacterial soaps. If your skin becomes reddened/irritated stop using the CHG.  1. Shower the NIGHT BEFORE SURGERY and the MORNING OF SURGERY with CHG soap.  2. If you choose to wash your hair, wash your hair first as usual with your normal shampoo.  3. After shampooing, rinse your hair and body thoroughly to remove the shampoo.  4. Use CHG as you would any other liquid soap. You can apply CHG directly to the skin and wash gently with a scrungie or a clean washcloth.  5. Apply the CHG soap to your body only from the neck down. Do not use on open wounds or open sores. Avoid contact with your eyes, ears, mouth, and genitals (private  parts). Wash face and genitals (private parts) with your normal soap.  6. Wash thoroughly, paying special attention to the area where your surgery will be performed.  7. Thoroughly rinse your body with warm water.  8. Do not shower/wash with your normal soap after using and rinsing off the CHG soap.  9. Pat yourself dry with a clean towel.  10. Wear clean pajamas to bed the night before surgery.  12. Place clean sheets on your bed the night of your first shower and do not sleep with pets.  13. Shower again with the CHG soap on the day of surgery prior to arriving at the hospital.  14. Do not apply any deodorants/lotions/powders.  15. Please wear clean clothes to the hospital.

## 2023-08-25 NOTE — Telephone Encounter (Signed)
 Left a message on Mr [name]'s personal voicemail (confirmed by name) that I will cancel his surgery and he can call us  when he is ready to reschedule.  I have canceled his post op appointments and notified the OR to cancel his surgery.

## 2023-08-27 ENCOUNTER — Ambulatory Visit: Admission: RE | Admit: 2023-08-27 | Source: Home / Self Care | Admitting: Neurosurgery

## 2023-08-27 ENCOUNTER — Encounter: Admission: RE | Payer: Self-pay | Source: Home / Self Care

## 2023-08-27 DIAGNOSIS — E782 Mixed hyperlipidemia: Secondary | ICD-10-CM

## 2023-08-27 DIAGNOSIS — Z01812 Encounter for preprocedural laboratory examination: Secondary | ICD-10-CM

## 2023-08-27 DIAGNOSIS — I10 Essential (primary) hypertension: Secondary | ICD-10-CM

## 2023-08-27 SURGERY — CARPAL TUNNEL RELEASE
Anesthesia: Monitor Anesthesia Care | Laterality: Left

## 2023-09-03 ENCOUNTER — Ambulatory Visit
Admission: RE | Admit: 2023-09-03 | Discharge: 2023-09-03 | Disposition: A | Discharge status: Home / Self Care | Source: Ambulatory Visit | Attending: Emergency Medicine | Admitting: Emergency Medicine

## 2023-09-03 DIAGNOSIS — R911 Solitary pulmonary nodule: Secondary | ICD-10-CM | POA: Diagnosis not present

## 2023-09-09 ENCOUNTER — Encounter: Admitting: Physician Assistant

## 2023-09-13 ENCOUNTER — Other Ambulatory Visit: Payer: Self-pay | Admitting: Emergency Medicine

## 2023-09-30 ENCOUNTER — Ambulatory Visit: Payer: Self-pay

## 2023-09-30 NOTE — Telephone Encounter (Signed)
  Chief Complaint: dizzziness/fainting Symptoms: dizziness on standing Frequency: more than six months Pertinent Negatives: Patient denies chest pain, palpitations, shortness of breath Disposition: [] ED /[] Urgent Care (no appt availability in office) / [x] Appointment(In office/virtual)/ []  [place] [place] [facility]/ [] Home Care/ [] Refused Recommended Disposition /[] [place] [facility]/ []  Follow-up with PCP Additional Notes: patient reports briefly passing out last night when he woke in the middle of the night to tun off his TV.  He reports four previous episodes like this and that he has been dizzy on standing for about the past six months.  He is experiencing no signs or symptoms of distress at this time. Appointment scheduled.  He will proceed to UC or activate EMS if symptoms worsen Copied from CRM [id number]. Topic: Clinical - Red Word Triage >> Sep 30, 2023  3:29 PM Albertha Alosa wrote: [facility] that prompted transfer to Nurse Triage: had a fainting spell last night, didnt know where he was, still feels lightheaded when he stands Reason for Disposition  [1] All other patients AND [2] now alert and feels fine  (Exception: SIMPLE FAINT due to stress, pain, prolonged standing, or suddenly standing)  Answer Assessment - Initial Assessment Questions 1. ONSET: "How long were you unconscious?" (minutes) "When did it happen?"     A couple of seconds 2. CONTENT: "What happened during period of unconsciousness?" (e.g., seizure activity)      none 3. MENTAL STATUS: "Alert and oriented now?" (oriented x 3 = name, month, location)      Oriented x 3 4. TRIGGER: "What do you think caused the fainting?" "What were you doing just before you fainted?"  (e.g., exercise, sudden standing up, prolonged standing)     Stood up too fast 5. RECURRENT SYMPTOM: "Have you ever passed out before?" If Yes, ask: "When was the last time?" and "What happened that time?"      yes 6. INJURY: "Did you sustain any injury during  the fall?"      no 7. CARDIAC SYMPTOMS: "Have you had any of the following symptoms: chest pain, difficulty breathing, palpitations?"     no 8. NEUROLOGIC SYMPTOMS: "Have you had any of the following symptoms: headache, numbness, vertigo, weakness?"     no 9. GI SYMPTOMS: "Have you had any of the following symptoms: abdomen pain, vomiting, diarrhea, blood in stools?"     no 10. OTHER SYMPTOMS: "Do you have any other symptoms?"       Dizziness when standing, good days and bad days  Protocols used: Fainting-A-AH

## 2023-10-01 NOTE — Telephone Encounter (Signed)
 Pt moved to your schedule for 10/02/23 @ 9:30am

## 2023-10-01 NOTE — Telephone Encounter (Signed)
 Noted and agreed, thank you.

## 2023-10-01 NOTE — Telephone Encounter (Signed)
 Please see triage note for patient as FYI and advise if anything needed before appt already scheduled

## 2023-10-02 ENCOUNTER — Ambulatory Visit: Admitting: Physician Assistant

## 2023-10-06 ENCOUNTER — Ambulatory Visit: Admitting: Physician Assistant

## 2023-10-07 ENCOUNTER — Encounter: Admitting: Neurosurgery

## 2023-10-11 ENCOUNTER — Other Ambulatory Visit: Payer: Self-pay | Admitting: Physician Assistant

## 2023-10-11 DIAGNOSIS — F32A Depression, unspecified: Secondary | ICD-10-CM

## 2023-10-20 ENCOUNTER — Other Ambulatory Visit: Payer: Self-pay | Admitting: Physician Assistant

## 2023-10-20 DIAGNOSIS — G4709 Other insomnia: Secondary | ICD-10-CM

## 2023-10-22 ENCOUNTER — Ambulatory Visit: Admitting: Emergency Medicine

## 2023-10-22 ENCOUNTER — Encounter: Payer: Self-pay | Admitting: Emergency Medicine

## 2023-11-03 DIAGNOSIS — M545 Low back pain, unspecified: Secondary | ICD-10-CM | POA: Diagnosis not present

## 2023-11-03 DIAGNOSIS — M1990 Unspecified osteoarthritis, unspecified site: Secondary | ICD-10-CM | POA: Diagnosis not present

## 2023-11-03 DIAGNOSIS — G4700 Insomnia, unspecified: Secondary | ICD-10-CM | POA: Diagnosis not present

## 2023-11-03 DIAGNOSIS — F330 Major depressive disorder, recurrent, mild: Secondary | ICD-10-CM | POA: Diagnosis not present

## 2023-11-03 DIAGNOSIS — I10 Essential (primary) hypertension: Secondary | ICD-10-CM | POA: Diagnosis not present

## 2023-11-03 DIAGNOSIS — F17211 Nicotine dependence, cigarettes, in remission: Secondary | ICD-10-CM | POA: Diagnosis not present

## 2023-11-03 DIAGNOSIS — E785 Hyperlipidemia, unspecified: Secondary | ICD-10-CM | POA: Diagnosis not present

## 2023-11-03 DIAGNOSIS — F411 Generalized anxiety disorder: Secondary | ICD-10-CM | POA: Diagnosis not present

## 2023-11-03 DIAGNOSIS — G621 Alcoholic polyneuropathy: Secondary | ICD-10-CM | POA: Diagnosis not present

## 2023-11-03 DIAGNOSIS — Z008 Encounter for other general examination: Secondary | ICD-10-CM | POA: Diagnosis not present

## 2023-11-07 ENCOUNTER — Emergency Department (HOSPITAL_BASED_OUTPATIENT_CLINIC_OR_DEPARTMENT_OTHER)
Admission: EM | Admit: 2023-11-07 | Discharge: 2023-11-08 | Disposition: A | Discharge status: Home / Self Care | Attending: Emergency Medicine | Admitting: Emergency Medicine

## 2023-11-07 ENCOUNTER — Other Ambulatory Visit: Payer: Self-pay

## 2023-11-07 DIAGNOSIS — H578A2 Foreign body sensation, left eye: Secondary | ICD-10-CM | POA: Diagnosis not present

## 2023-11-07 DIAGNOSIS — I10 Essential (primary) hypertension: Secondary | ICD-10-CM | POA: Diagnosis not present

## 2023-11-07 DIAGNOSIS — T1502XA Foreign body in cornea, left eye, initial encounter: Secondary | ICD-10-CM | POA: Diagnosis not present

## 2023-11-07 MED ORDER — TETRACAINE HCL 0.5 % OP SOLN
2.0000 [drp] | Freq: Once | OPHTHALMIC | Status: AC
  Administered 2023-11-07: 2 [drp] via OPHTHALMIC
  Filled 2023-11-07: qty 4

## 2023-11-07 MED ORDER — FLUORESCEIN SODIUM 1 MG OP STRP
1.0000 | ORAL_STRIP | Freq: Once | OPHTHALMIC | Status: AC
  Administered 2023-11-07: 1 via OPHTHALMIC
  Filled 2023-11-07: qty 1

## 2023-11-07 MED ORDER — ERYTHROMYCIN 5 MG/GM OP OINT
TOPICAL_OINTMENT | OPHTHALMIC | 0 refills | Status: DC
Start: 2023-11-07 — End: 2023-11-17

## 2023-11-07 MED ORDER — IBUPROFEN 600 MG PO TABS: 600.0000 mg | ORAL_TABLET | Freq: Three times a day (TID) | ORAL | 0 refills | Status: DC | PRN

## 2023-11-07 NOTE — ED Notes (Signed)
 Eye exam supplies at bedside for edp

## 2023-11-07 NOTE — Discharge Instructions (Signed)
 You were seen today with concerns for foreign body in your eye.  I do not appreciate any foreign bodies in your eye.  You may have sustained a small cut called a corneal abrasion.  Use eye ointment and take ibuprofen .  Follow-up with your eye doctor on Monday if not improving.

## 2023-11-07 NOTE — ED Triage Notes (Signed)
 Pt reports he woke up this morning with left eye pain and feeling like he has something inside his eye. No trauma. Attempted to wash out eye x3 without relief. Endorses blurry vision.
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
## 2023-11-08 NOTE — ED Notes (Signed)
 AVS provided by edp was reviewed with pt. Pt verbalized understanding with no additional questions at this time. Pt was able to verify pharmacy.

## 2023-11-09 ENCOUNTER — Ambulatory Visit
Admission: RE | Admit: 2023-11-09 | Discharge: 2023-11-09 | Disposition: A | Discharge status: Home / Self Care | Source: Ambulatory Visit | Attending: Neurosurgery | Admitting: Neurosurgery

## 2023-11-09 ENCOUNTER — Ambulatory Visit
Admission: RE | Admit: 2023-11-09 | Discharge: 2023-11-09 | Disposition: A | Discharge status: Home / Self Care | Attending: Neurosurgery | Admitting: Neurosurgery

## 2023-11-09 ENCOUNTER — Other Ambulatory Visit: Payer: Self-pay

## 2023-11-09 ENCOUNTER — Ambulatory Visit (INDEPENDENT_AMBULATORY_CARE_PROVIDER_SITE_OTHER): Admitting: Neurosurgery

## 2023-11-09 VITALS — BP 118/72 | Ht 71.0 in | Wt 148.4 lb

## 2023-11-09 DIAGNOSIS — Z981 Arthrodesis status: Secondary | ICD-10-CM | POA: Diagnosis not present

## 2023-11-09 DIAGNOSIS — G5603 Carpal tunnel syndrome, bilateral upper limbs: Secondary | ICD-10-CM | POA: Diagnosis not present

## 2023-11-09 DIAGNOSIS — T84498A Other mechanical complication of other internal orthopedic devices, implants and grafts, initial encounter: Secondary | ICD-10-CM | POA: Insufficient documentation

## 2023-11-09 DIAGNOSIS — M4802 Spinal stenosis, cervical region: Secondary | ICD-10-CM | POA: Diagnosis not present

## 2023-11-09 DIAGNOSIS — Z01818 Encounter for other preprocedural examination: Secondary | ICD-10-CM

## 2023-11-09 DIAGNOSIS — G5602 Carpal tunnel syndrome, left upper limb: Secondary | ICD-10-CM | POA: Insufficient documentation

## 2023-11-09 DIAGNOSIS — G5601 Carpal tunnel syndrome, right upper limb: Secondary | ICD-10-CM | POA: Insufficient documentation

## 2023-11-09 NOTE — Patient Instructions (Signed)
 Please see below for information in regards to your upcoming surgery:   Planned surgery: 1) left carpal tunnel release with ultrasound guidance & 2) right carpal tunnel release with ultrasound guidance   Surgery date: 1) 11/26/23 and 2) 12/31/23 at [facility] (Medical Mall: [address], [place], [place] [address]) - you will find out your arrival time the business day before your surgery.   Pre-op appointment at [facility] Pre-admit Testing: you will receive a call with a date/time for this appointment. If you are scheduled for an in person appointment, Pre-admit Testing is located on the first floor of the [facility] building, [address], [address]. During this appointment, they will advise you which medications you can take the morning of surgery, and which medications you will need to hold for surgery. Labs (such as blood work, EKG) may be done at your pre-op appointment. You are not required to fast for these labs. Should you need to change your pre-op appointment, please call Pre-admit testing at [phone].      How to contact us :  If you have any questions/concerns before or after surgery, you can reach us  at [phone], or you can send a mychart message. We can be reached by phone or mychart 8am-4pm, Monday-Friday.  *Please note: Calls after 4pm are forwarded to a third party answering service. Mychart messages are not routinely monitored during evenings, weekends, and holidays. Please call our office to contact the answering service for urgent concerns during non-business hours.     If you have FMLA/disability paperwork, please drop it off or fax it to [phone]    Appointments/FMLA & disability paperwork: Odetta Mora, & Ritta Registered Nurses/Surgery schedulers: Shabrea Weldin & Lauren Medical Assistants: Damien ODESSIA Sailors Physician Assistants: Lyle Decamp, PA-C, Edsel Goods, PA-C & Glade Boys, PA-C Surgeons: Reeves Daisy, MD & Penne Sharps, MD   [facility] PREADMIT TESTING VISIT and SURGERY INFORMATION SHEET   Now that surgery has been scheduled you can anticipate several phone calls from [facility] services. A pharmacy technician will call you to verify your current list of medications taken at home.               The Pre-Service Center will call to verify your insurance information and to give you billing estimates and information.             The Preadmit Testing Office will be calling to schedule a visit to obtain information for the anesthesia team and provide instructions on preparation for surgery.  What can you expect for the Preadmit Testing Visit: Appointments may be scheduled in-person or by telephone.  If a telephone visit is scheduled, you may be asked to come into the office to have lab tests or other studies performed.   This visit will not be completed any greater than 14 days prior to your surgery.  If your surgery has been scheduled for a future date, please do not be alarmed if we have not contacted you to schedule an appointment more than a month prior to the surgery date.    Please be prepared to provide the following information during this appointment:            -Personal medical history                                               -  Medication and allergy list            -Any history of problems with anesthesia              -Recent lab work or diagnostic studies            -Please notify us  of any needs we should be aware of to provide the best care possible           -You will be provided with instructions on how to prepare for your surgery.    On The Day of Surgery:  You must have a driver to take you home after surgery, you will be asked not to drive for 24 hours following surgery.  Taxi, [name] and non-medical transport will not be acceptable means of transportation unless you have a responsible individual who will be traveling with  you.  Visitors in the surgical area:   2 people will be able to visit you in your room once your preparation for surgery has been completed. During surgery, your visitors will be asked to wait in the Surgery Waiting Area.  It is not a requirement for them to stay, if they prefer to leave and come back.  Your visitor(s) will be given an update once the surgery has been completed.  No visitors are allowed in the initial recovery room to respect patient privacy and safety.  Once you are more awake and transfer to the secondary recovery area, or are transferred to an inpatient room, visitors will again be able to see you.  To respect and protect your privacy: We will ask on the day of surgery who your driver will be and what the contact number for that individual will be. We will ask if it is okay to share information with this individual, or if there is an alternative individual that we, or the surgeon, should contact to provide updates and information. If family or friends come to the surgical information desk requesting information about you, who you have not listed with us , no information will be given.   It may be helpful to designate someone as the main contact who will be responsible for updating your other friends and family.    PREADMIT TESTING OFFICE: [phone] SAME DAY SURGERY: [phone] We look forward to caring for you before and throughout the process of your surgery.
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
## 2023-11-09 NOTE — Progress Notes (Deleted)
 Left first.    Then right   Cervical xrays.      Scheudled 3 -4 weeks apart.
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
## 2023-11-12 ENCOUNTER — Ambulatory Visit: Admitting: Physician Assistant

## 2023-11-16 ENCOUNTER — Ambulatory Visit: Admitting: Physician Assistant

## 2023-11-16 ENCOUNTER — Encounter: Payer: Self-pay | Admitting: Physician Assistant

## 2023-11-16 VITALS — BP 128/72 | HR 87 | Temp 98.4°F | Ht 71.0 in | Wt 147.8 lb

## 2023-11-16 DIAGNOSIS — R42 Dizziness and giddiness: Secondary | ICD-10-CM | POA: Diagnosis not present

## 2023-11-16 DIAGNOSIS — M25511 Pain in right shoulder: Secondary | ICD-10-CM

## 2023-11-16 DIAGNOSIS — M2560 Stiffness of unspecified joint, not elsewhere classified: Secondary | ICD-10-CM | POA: Diagnosis not present

## 2023-11-16 DIAGNOSIS — Z131 Encounter for screening for diabetes mellitus: Secondary | ICD-10-CM

## 2023-11-16 DIAGNOSIS — E871 Hypo-osmolality and hyponatremia: Secondary | ICD-10-CM

## 2023-11-16 DIAGNOSIS — G5603 Carpal tunnel syndrome, bilateral upper limbs: Secondary | ICD-10-CM

## 2023-11-16 DIAGNOSIS — Z125 Encounter for screening for malignant neoplasm of prostate: Secondary | ICD-10-CM | POA: Diagnosis not present

## 2023-11-16 DIAGNOSIS — E782 Mixed hyperlipidemia: Secondary | ICD-10-CM | POA: Diagnosis not present

## 2023-11-16 DIAGNOSIS — G8929 Other chronic pain: Secondary | ICD-10-CM

## 2023-11-16 DIAGNOSIS — R911 Solitary pulmonary nodule: Secondary | ICD-10-CM

## 2023-11-16 LAB — CBC WITH DIFFERENTIAL/PLATELET
Basophils Absolute: 0 K/uL (ref 0.0–0.1)
Basophils Relative: 0.5 % (ref 0.0–3.0)
Eosinophils Absolute: 0.2 K/uL (ref 0.0–0.7)
Eosinophils Relative: 3.6 % (ref 0.0–5.0)
HCT: 35.2 % — ABNORMAL LOW (ref 39.0–52.0)
Hemoglobin: 12 g/dL — ABNORMAL LOW (ref 13.0–17.0)
Lymphocytes Relative: 12 % (ref 12.0–46.0)
Lymphs Abs: 0.8 K/uL (ref 0.7–4.0)
MCHC: 34 g/dL (ref 30.0–36.0)
MCV: 90.9 fl (ref 78.0–100.0)
Monocytes Absolute: 0.7 K/uL (ref 0.1–1.0)
Monocytes Relative: 10.9 % (ref 3.0–12.0)
Neutro Abs: 4.8 K/uL (ref 1.4–7.7)
Neutrophils Relative %: 73 % (ref 43.0–77.0)
Platelets: 241 K/uL (ref 150.0–400.0)
RBC: 3.87 Mil/uL — ABNORMAL LOW (ref 4.22–5.81)
RDW: 15.4 % (ref 11.5–15.5)
WBC: 6.6 K/uL (ref 4.0–10.5)

## 2023-11-16 LAB — LIPID PANEL
Cholesterol: 154 mg/dL (ref 0–200)
HDL: 46.3 mg/dL (ref >39.00)
LDL Cholesterol: 94 mg/dL (ref 0–99)
NonHDL: 107.37
Total CHOL/HDL Ratio: 3
Triglycerides: 69 mg/dL (ref 0.0–149.0)
VLDL: 13.8 mg/dL (ref 0.0–40.0)

## 2023-11-16 LAB — COMPREHENSIVE METABOLIC PANEL WITH GFR
ALT: 56 U/L — ABNORMAL HIGH (ref 0–53)
AST: 65 U/L — ABNORMAL HIGH (ref 0–37)
Albumin: 4 g/dL (ref 3.5–5.2)
Alkaline Phosphatase: 58 U/L (ref 39–117)
BUN: 24 mg/dL — ABNORMAL HIGH (ref 6–23)
CO2: 28 meq/L (ref 19–32)
Calcium: 8.8 mg/dL (ref 8.4–10.5)
Chloride: 105 meq/L (ref 96–112)
Creatinine, Ser: 0.85 mg/dL (ref 0.40–1.50)
GFR: 89.92 mL/min (ref >60.00)
Glucose, Bld: 122 mg/dL — ABNORMAL HIGH (ref 70–99)
Potassium: 4 meq/L (ref 3.5–5.1)
Sodium: 139 meq/L (ref 135–145)
Total Bilirubin: 0.8 mg/dL (ref 0.2–1.2)
Total Protein: 5.9 g/dL — ABNORMAL LOW (ref 6.0–8.3)

## 2023-11-16 LAB — SEDIMENTATION RATE: Sed Rate: 4 mm/h (ref 0–20)

## 2023-11-16 LAB — TSH: TSH: 1.06 u[IU]/mL (ref 0.35–5.50)

## 2023-11-16 LAB — C-REACTIVE PROTEIN: CRP: <1 mg/dL (ref 0.5–20.0)

## 2023-11-16 LAB — HEMOGLOBIN A1C: Hgb A1c MFr Bld: 5.3 % (ref 4.6–6.5)

## 2023-11-16 LAB — PSA: PSA: 3.15 ng/mL (ref 0.10–4.00)
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
## 2023-11-17 ENCOUNTER — Encounter: Payer: Self-pay | Admitting: Physician Assistant

## 2023-11-17 ENCOUNTER — Ambulatory Visit: Payer: Self-pay | Admitting: Neurosurgery

## 2023-11-18 ENCOUNTER — Encounter
Admission: RE | Admit: 2023-11-18 | Discharge: 2023-11-18 | Disposition: A | Discharge status: Home / Self Care | Source: Ambulatory Visit | Attending: Neurosurgery | Admitting: Neurosurgery

## 2023-11-18 ENCOUNTER — Encounter: Admitting: Physician Assistant

## 2023-11-18 ENCOUNTER — Other Ambulatory Visit: Payer: Self-pay

## 2023-11-18 ENCOUNTER — Ambulatory Visit: Payer: Self-pay | Admitting: Physician Assistant

## 2023-11-18 DIAGNOSIS — I10 Essential (primary) hypertension: Secondary | ICD-10-CM

## 2023-11-18 DIAGNOSIS — Z8614 Personal history of Methicillin resistant Staphylococcus aureus infection: Secondary | ICD-10-CM

## 2023-11-18 HISTORY — DX: Nausea with vomiting, unspecified: R11.2

## 2023-11-18 HISTORY — DX: Dizziness and giddiness: R42

## 2023-11-18 HISTORY — DX: Other complications of anesthesia, initial encounter: T88.59XA

## 2023-11-18 HISTORY — DX: Failed or difficult intubation, initial encounter: T88.4XXA

## 2023-11-18 HISTORY — DX: Unspecified osteoarthritis, unspecified site: M19.90

## 2023-11-18 HISTORY — DX: Anemia, unspecified: D64.9

## 2023-11-18 HISTORY — DX: Other specified postprocedural states: Z98.890

## 2023-11-18 NOTE — Telephone Encounter (Signed)
 Please see pt msg and advise if orders are needed

## 2023-11-18 NOTE — Patient Instructions (Addendum)
 Your procedure is scheduled on: 11/26/23 - Thursday Report to the Registration Desk on the 1st floor of the [facility]. To find out your arrival time, please call [phone] between 1PM - 3PM on: 11/25/23 - Wednesday If your arrival time is 6:00 am, do not arrive before that time as the [facility] entrance doors do not open until 6:00 am.  REMEMBER: Instructions that are not followed completely may result in serious medical risk, up to and including death; or upon the discretion of your surgeon and anesthesiologist your surgery may need to be rescheduled.  Do not eat food after midnight the night before surgery.  No gum chewing or hard candies.  You may however, drink CLEAR liquids up to 2 hours before you are scheduled to arrive for your surgery. Do not drink anything within 2 hours of your scheduled arrival time.  Clear liquids include: - water  - apple juice without pulp - gatorade (not RED colors) - black coffee or tea (Do NOT add milk or creamers to the coffee or tea) Do NOT drink anything that is not on this list.   One week prior to surgery: Stop Anti-inflammatories (NSAIDS) such as Advil , Aleve , Ibuprofen , Motrin , Naproxen , Naprosyn  and Aspirin based products such as Excedrin, Goody's Powder, BC Powder. You may take Tylenol  if needed for pain up until the day of surgery.  Stop ANY OVER THE COUNTER supplements until after surgery : VITAMIN D3   HOLD sildenafil  (VIAGRA ) beginning 11/24/23 , may resume taking after surgery.  HOLD irbesartan  (AVAPRO ) on the morning of surgery.  ON THE DAY OF SURGERY ONLY TAKE THESE MEDICATIONS WITH SIPS OF WATER:  none   No Alcohol  for 24 hours before or after surgery.  No Smoking including e-cigarettes for 24 hours before surgery.  No chewable tobacco products for at least 6 hours before surgery.  No nicotine patches on the day of surgery.  Do not use any recreational drugs for at least a week (preferably 2 weeks) before your  surgery.  Please be advised that the combination of cocaine and anesthesia may have negative outcomes, up to and including death. If you test positive for cocaine, your surgery will be cancelled.  On the morning of surgery brush your teeth with toothpaste and water, you may rinse your mouth with mouthwash if you wish. Do not swallow any toothpaste or mouthwash.  Use CHG Soap or wipes as directed on instruction sheet.  Do not wear jewelry, make-up, hairpins, clips or nail polish.  For welded (permanent) jewelry: bracelets, anklets, waist bands, etc.  Please have this removed prior to surgery.  If it is not removed, there is a chance that hospital personnel will need to cut it off on the day of surgery.  Do not wear lotions, powders, or perfumes.   Do not shave body hair from the neck down 48 hours before surgery.  Contact lenses, hearing aids and dentures may not be worn into surgery.  Do not bring valuables to the hospital. [facility] is not responsible for any missing/lost belongings or valuables.   Notify your doctor if there is any change in your medical condition (cold, fever, infection).  Wear comfortable clothing (specific to your surgery type) to the hospital.  After surgery, you can help prevent lung complications by doing breathing exercises.  Take deep breaths and cough every 1-2 hours. Your doctor may order a device called an Incentive Spirometer to help you take deep breaths.  When coughing or sneezing, hold  a pillow firmly against your incision with both hands. This is called "splinting." Doing this helps protect your incision. It also decreases belly discomfort.  If you are being admitted to the hospital overnight, leave your suitcase in the car. After surgery it may be brought to your room.  In case of increased patient census, it may be necessary for you, the patient, to continue your postoperative care in the Same Day Surgery department.  If you are being  discharged the day of surgery, you will not be allowed to drive home. You will need a responsible individual to drive you home and stay with you for 24 hours after surgery.   If you are taking public transportation, you will need to have a responsible individual with you.  Please call the Pre-admissions Testing Dept. at [phone] if you have any questions about these instructions.  Surgery Visitation Policy:  Patients having surgery or a procedure may have two visitors.  Children under the age of 54 must have an adult with them who is not the patient.  Inpatient Visitation:    Visiting hours are 7 a.m. to 8 p.m. Up to four visitors are allowed at one time in a patient room. The visitors may rotate out with other people during the day.  One visitor age 77 or older may stay with the patient overnight and must be in the room by 8 p.m.   Merchandiser, retail to address health-related social needs:  [email or web address]     Preparing for Surgery with CHLORHEXIDINE GLUCONATE (CHG) Soap  Chlorhexidine Gluconate (CHG) Soap  o An antiseptic cleaner that kills germs and bonds with the skin to continue killing germs even after washing  o Used for showering the night before surgery and morning of surgery  Before surgery, you can play an important role by reducing the number of germs on your skin.  CHG (Chlorhexidine gluconate) soap is an antiseptic cleanser which kills germs and bonds with the skin to continue killing germs even after washing.  Please do not use if you have an allergy to CHG or antibacterial soaps. If your skin becomes reddened/irritated stop using the CHG.  1. Shower the NIGHT BEFORE SURGERY and the MORNING OF SURGERY with CHG soap.  2. If you choose to wash your hair, wash your hair first as usual with your normal shampoo.  3. After shampooing, rinse your hair and body thoroughly to remove the shampoo.  4. Use CHG as you would any other  liquid soap. You can apply CHG directly to the skin and wash gently with a scrungie or a clean washcloth.  5. Apply the CHG soap to your body only from the neck down. Do not use on open wounds or open sores. Avoid contact with your eyes, ears, mouth, and genitals (private parts). Wash face and genitals (private parts) with your normal soap.  6. Wash thoroughly, paying special attention to the area where your surgery will be performed.  7. Thoroughly rinse your body with warm water.  8. Do not shower/wash with your normal soap after using and rinsing off the CHG soap.  9. Pat yourself dry with a clean towel.  10. Wear clean pajamas to bed the night before surgery.  12. Place clean sheets on your bed the night of your first shower and do not sleep with pets.  13. Shower again with the CHG soap on the day of surgery prior to arriving at the hospital.  14. Do not apply any deodorants/lotions/powders.  15. Please wear clean clothes to the hospital.

## 2023-11-20 ENCOUNTER — Encounter
Admission: RE | Admit: 2023-11-20 | Discharge: 2023-11-20 | Disposition: A | Discharge status: Home / Self Care | Source: Ambulatory Visit | Attending: Neurosurgery | Admitting: Neurosurgery

## 2023-11-20 DIAGNOSIS — Z01810 Encounter for preprocedural cardiovascular examination: Secondary | ICD-10-CM | POA: Diagnosis not present

## 2023-11-20 DIAGNOSIS — I10 Essential (primary) hypertension: Secondary | ICD-10-CM | POA: Insufficient documentation

## 2023-11-23 NOTE — Progress Notes (Signed)
 I, Leotis Batter, CMA acting as a scribe for Artist Lloyd, MD.  [name] is a 68 y.o. male who presents to [facility] today for cont'd bilat shoulder pain. Pt was last seen by Dr. Leonce on 03/03/23 and was given bilat subacromial steroid injections.  Today, pt reports L>R shoulder pain. Scheduled for carpal tunnel surgery. C/O Radiating pain into the arms and hands. Sx worse with overhead reaching. Locates pain to top of the shoulder. C/O n/t bilaterally. Denies decreased grip strength. Fingers feel swollen when making a fist. C/O chronic neck pain.   Notes that sister and niece both have EDS. Hx of syncope and collapse (prior to BP med adjustment).   Patient is having carpal tunnel surgery on this week Thursday, 24 July.  Pertinent review of systems: No fever or chills  Relevant historical information: Carpal tunnel surgery   Exam:  BP 110/72   Pulse 72   Ht 5' 11 (1.803 m)   Wt 157 lb (71.2 kg)   SpO2 99%   BMI 21.90 kg/m  General: Well Developed, well nourished, and in no acute distress.   MSK: Shoulders bilaterally reduced range of motion. Intact strength.    Lab and Radiology Results  X-ray images bilateral shoulders obtained today personally and independently interpreted.  Right shoulder: Minimal glenohumeral DJD.  Moderate AC DJD.  No acute fractures.  Left shoulder: Mild glenohumeral DJD.  Mild AC DJD.  No acute fractures are visible.  Await formal radiology review    Assessment and Plan: 68 y.o. male with bilateral shoulder pain due to subacromial impingement and bursitis.  I think is a bad idea to do bilateral shoulder injections 2 days prior to a planned surgery.  I am worried that I will have hyperglycemia or elevated white blood cell count the morning of surgery which may delay surgery.  I am communicating with his surgeon.  Will go ahead and reschedule the injections for next week or the week after with my  partner Dr. Leonce.  Will going get x-rays today has been quite a while since she has had shoulder x-rays.  I did prescribe gabapentin  to use as needed as well.   PDMP not reviewed this encounter. Orders Placed This Encounter  Procedures   US  LIMITED JOINT SPACE STRUCTURES UP BILAT(NO LINKED CHARGES)    Reason for Exam (SYMPTOM  OR DIAGNOSIS REQUIRED):   bilat shoulder pain    Preferred imaging location?:   [facility] Shoulder Left    Standing Status:   Future    Expiration Date:   12/25/2023    Reason for Exam (SYMPTOM  OR DIAGNOSIS REQUIRED):   bilat shoulder pain    Preferred imaging location?:   [facility]   DG Shoulder Right    Standing Status:   Future    Expiration Date:   11/23/2024    Reason for Exam (SYMPTOM  OR DIAGNOSIS REQUIRED):   bilat shoulder pain    Preferred imaging location?:   [facility]   Meds ordered this encounter  Medications   gabapentin  (NEURONTIN ) 100 MG capsule    Sig: Take 1-3 capsules (100-300 mg total) by mouth 3 (three) times daily as needed.    Dispense:  90 capsule    Refill:  3     Discussed warning signs or symptoms. Please see discharge instructions. Patient expresses understanding.   The above documentation has been reviewed and is accurate and  complete Eiliyah Reh, M.D.

## 2023-11-24 ENCOUNTER — Encounter: Payer: Self-pay | Admitting: Family Medicine

## 2023-11-24 ENCOUNTER — Ambulatory Visit (INDEPENDENT_AMBULATORY_CARE_PROVIDER_SITE_OTHER)

## 2023-11-24 ENCOUNTER — Other Ambulatory Visit: Payer: Self-pay

## 2023-11-24 ENCOUNTER — Ambulatory Visit (INDEPENDENT_AMBULATORY_CARE_PROVIDER_SITE_OTHER): Admitting: Family Medicine

## 2023-11-24 VITALS — BP 110/72 | HR 72 | Ht 71.0 in | Wt 157.0 lb

## 2023-11-24 DIAGNOSIS — G8929 Other chronic pain: Secondary | ICD-10-CM

## 2023-11-24 DIAGNOSIS — M19011 Primary osteoarthritis, right shoulder: Secondary | ICD-10-CM | POA: Diagnosis not present

## 2023-11-24 DIAGNOSIS — M25511 Pain in right shoulder: Secondary | ICD-10-CM | POA: Diagnosis not present

## 2023-11-24 DIAGNOSIS — M25512 Pain in left shoulder: Secondary | ICD-10-CM

## 2023-11-24 DIAGNOSIS — M19012 Primary osteoarthritis, left shoulder: Secondary | ICD-10-CM | POA: Diagnosis not present

## 2023-11-24 DIAGNOSIS — G5603 Carpal tunnel syndrome, bilateral upper limbs: Secondary | ICD-10-CM

## 2023-11-24 MED ORDER — GABAPENTIN 100 MG PO CAPS: 100.0000 mg | ORAL_CAPSULE | Freq: Three times a day (TID) | ORAL | 3 refills | Status: DC | PRN

## 2023-11-24 NOTE — Patient Instructions (Signed)
 Thank you for coming in today.   Please get an Xray today before you leave   Schedule with Dr. Leonce next week for shoulder injections.

## 2023-11-26 ENCOUNTER — Ambulatory Visit: Admitting: Certified Registered"

## 2023-11-26 ENCOUNTER — Encounter: Payer: Self-pay | Admitting: Neurosurgery

## 2023-11-26 ENCOUNTER — Other Ambulatory Visit: Payer: Self-pay | Admitting: Neurosurgery

## 2023-11-26 ENCOUNTER — Ambulatory Visit
Admission: RE | Admit: 2023-11-26 | Discharge: 2023-11-26 | Disposition: A | Discharge status: Home / Self Care | Attending: Neurosurgery | Admitting: Neurosurgery

## 2023-11-26 ENCOUNTER — Encounter
Admission: RE | Disposition: A | Discharge status: Home / Self Care | Payer: Self-pay | Source: Home / Self Care | Attending: Neurosurgery

## 2023-11-26 ENCOUNTER — Other Ambulatory Visit: Payer: Self-pay

## 2023-11-26 DIAGNOSIS — I10 Essential (primary) hypertension: Secondary | ICD-10-CM | POA: Diagnosis not present

## 2023-11-26 DIAGNOSIS — Z01818 Encounter for other preprocedural examination: Secondary | ICD-10-CM

## 2023-11-26 DIAGNOSIS — Z87891 Personal history of nicotine dependence: Secondary | ICD-10-CM | POA: Insufficient documentation

## 2023-11-26 DIAGNOSIS — G5601 Carpal tunnel syndrome, right upper limb: Secondary | ICD-10-CM

## 2023-11-26 DIAGNOSIS — G5603 Carpal tunnel syndrome, bilateral upper limbs: Secondary | ICD-10-CM | POA: Diagnosis not present

## 2023-11-26 DIAGNOSIS — K219 Gastro-esophageal reflux disease without esophagitis: Secondary | ICD-10-CM | POA: Diagnosis not present

## 2023-11-26 DIAGNOSIS — G5602 Carpal tunnel syndrome, left upper limb: Secondary | ICD-10-CM

## 2023-11-26 DIAGNOSIS — Z79899 Other long term (current) drug therapy: Secondary | ICD-10-CM | POA: Diagnosis not present

## 2023-11-26 DIAGNOSIS — M4322 Fusion of spine, cervical region: Secondary | ICD-10-CM

## 2023-11-26 HISTORY — PX: CARPAL TUNNEL RELEASE: SHX101

## 2023-11-26 SURGERY — CARPAL TUNNEL RELEASE
Anesthesia: Monitor Anesthesia Care | Laterality: Left

## 2023-11-26 MED ORDER — LACTATED RINGERS IV SOLN: INTRAVENOUS | Status: DC

## 2023-11-26 MED ORDER — DEXAMETHASONE SODIUM PHOSPHATE 10 MG/ML IJ SOLN
INTRAMUSCULAR | Status: DC | PRN
  Administered 2023-11-26: 10 mg via INTRAVENOUS

## 2023-11-26 MED ORDER — CHLORHEXIDINE GLUCONATE 0.12 % MT SOLN
15.0000 mL | Freq: Once | OROMUCOSAL | Status: AC
  Administered 2023-11-26: 15 mL via OROMUCOSAL

## 2023-11-26 MED ORDER — OXYCODONE HCL 5 MG/5ML PO SOLN: 5.0000 mg | Freq: Once | ORAL | Status: AC | PRN

## 2023-11-26 MED ORDER — KETOROLAC TROMETHAMINE 30 MG/ML IJ SOLN
INTRAMUSCULAR | Status: AC
  Filled 2023-11-26: qty 1

## 2023-11-26 MED ORDER — CEFAZOLIN SODIUM-DEXTROSE 2-4 GM/100ML-% IV SOLN
INTRAVENOUS | Status: AC
Start: 2023-11-26 — End: 2023-11-26
  Filled 2023-11-26: qty 100

## 2023-11-26 MED ORDER — CEFAZOLIN IN SODIUM CHLORIDE 2-0.9 GM/100ML-% IV SOLN
2.0000 g | Freq: Once | INTRAVENOUS | Status: AC
  Administered 2023-11-26: 2 g via INTRAVENOUS
  Filled 2023-11-26: qty 100

## 2023-11-26 MED ORDER — PROPOFOL 500 MG/50ML IV EMUL
INTRAVENOUS | Status: DC | PRN
  Administered 2023-11-26: 50 ug/kg/min via INTRAVENOUS
  Administered 2023-11-26: 50 mg via INTRAVENOUS

## 2023-11-26 MED ORDER — LIDOCAINE HCL (CARDIAC) PF 100 MG/5ML IV SOSY
PREFILLED_SYRINGE | INTRAVENOUS | Status: DC | PRN
  Administered 2023-11-26: 60 mg via INTRAVENOUS

## 2023-11-26 MED ORDER — OXYCODONE HCL 5 MG PO TABS
5.0000 mg | ORAL_TABLET | Freq: Once | ORAL | Status: AC | PRN
  Administered 2023-11-26: 5 mg via ORAL

## 2023-11-26 MED ORDER — KETOROLAC TROMETHAMINE 30 MG/ML IJ SOLN
INTRAMUSCULAR | Status: DC | PRN
  Administered 2023-11-26: 15 mg via INTRAVENOUS

## 2023-11-26 MED ORDER — LIDOCAINE HCL (PF) 2 % IJ SOLN
INTRAMUSCULAR | Status: AC
  Filled 2023-11-26: qty 5

## 2023-11-26 MED ORDER — CHLORHEXIDINE GLUCONATE 0.12 % MT SOLN
OROMUCOSAL | Status: AC
  Filled 2023-11-26: qty 15

## 2023-11-26 MED ORDER — FENTANYL CITRATE (PF) 100 MCG/2ML IJ SOLN: 25.0000 ug | INTRAMUSCULAR | Status: DC | PRN

## 2023-11-26 MED ORDER — IBUPROFEN 600 MG PO TABS: 600.0000 mg | ORAL_TABLET | Freq: Three times a day (TID) | ORAL | 0 refills | Status: DC | PRN

## 2023-11-26 MED ORDER — 0.9 % SODIUM CHLORIDE (POUR BTL) OPTIME
TOPICAL | Status: DC | PRN
Start: 2023-11-26 — End: 2023-11-26
  Administered 2023-11-26: 500 mL

## 2023-11-26 MED ORDER — ONDANSETRON HCL 4 MG/2ML IJ SOLN
INTRAMUSCULAR | Status: DC | PRN
  Administered 2023-11-26: 4 mg via INTRAVENOUS

## 2023-11-26 MED ORDER — LIDOCAINE HCL (PF) 1 % IJ SOLN
INTRAMUSCULAR | Status: AC
Start: 2023-11-26 — End: 2023-11-26
  Filled 2023-11-26: qty 60

## 2023-11-26 MED ORDER — PROPOFOL 10 MG/ML IV BOLUS
INTRAVENOUS | Status: AC
  Filled 2023-11-26: qty 40

## 2023-11-26 MED ORDER — MIDAZOLAM HCL 2 MG/2ML IJ SOLN
INTRAMUSCULAR | Status: DC | PRN
  Administered 2023-11-26: 2 mg via INTRAVENOUS

## 2023-11-26 MED ORDER — ONDANSETRON HCL 4 MG/2ML IJ SOLN
INTRAMUSCULAR | Status: AC
  Filled 2023-11-26: qty 2

## 2023-11-26 MED ORDER — OXYCODONE HCL 5 MG PO TABS
ORAL_TABLET | ORAL | Status: AC
Start: 2023-11-26 — End: 2023-11-26
  Filled 2023-11-26: qty 1

## 2023-11-26 MED ORDER — MIDAZOLAM HCL 2 MG/2ML IJ SOLN
INTRAMUSCULAR | Status: AC
  Filled 2023-11-26: qty 2

## 2023-11-26 MED ORDER — HYDROCODONE-ACETAMINOPHEN 5-325 MG PO TABS: 1.0000 | ORAL_TABLET | Freq: Four times a day (QID) | ORAL | 0 refills | Status: AC | PRN

## 2023-11-26 MED ORDER — LIDOCAINE HCL 1 % IJ SOLN
INTRAMUSCULAR | Status: DC | PRN
  Administered 2023-11-26: 5 mL

## 2023-11-26 MED ORDER — DEXAMETHASONE SODIUM PHOSPHATE 10 MG/ML IJ SOLN
INTRAMUSCULAR | Status: AC
  Filled 2023-11-26: qty 1

## 2023-11-26 MED ORDER — LIDOCAINE HCL (PF) 1 % IJ SOLN
INTRAMUSCULAR | Status: AC
  Filled 2023-11-26: qty 30

## 2023-11-26 MED ORDER — ORAL CARE MOUTH RINSE
15.0000 mL | Freq: Once | OROMUCOSAL | Status: AC
Start: 2023-11-26 — End: 2023-11-26

## 2023-11-26 SURGICAL SUPPLY — 20 items
BNDG ADH 1X3 SHEER STRL LF (GAUZE/BANDAGES/DRESSINGS) ×1 IMPLANT
BNDG ADH 2 X3.75 FABRIC TAN LF (GAUZE/BANDAGES/DRESSINGS) IMPLANT
BRUSH SCRUB EZ 4% CHG (MISCELLANEOUS) ×1 IMPLANT
CHLORAPREP W/TINT 26 (MISCELLANEOUS) ×1 IMPLANT
COVER PROBE FLX POLY STRL (MISCELLANEOUS) ×1 IMPLANT
DERMABOND ADVANCED .7 DNX12 (GAUZE/BANDAGES/DRESSINGS) ×1 IMPLANT
DRAPE EXTREMITY 106X87X128.5 (DRAPES) ×1 IMPLANT
DRAPE IMP U-DRAPE 54X76 (DRAPES) ×1 IMPLANT
DRAPE SHEET LG 3/4 BI-LAMINATE (DRAPES) ×1 IMPLANT
GLOVE BIOGEL PI IND STRL 8 (GLOVE) ×1 IMPLANT
GLOVE SRG 8 PF TXTR STRL LF DI (GLOVE) ×1 IMPLANT
GLOVE SURG SYN 7.5 PF PI (GLOVE) ×1 IMPLANT
GOWN SRG XL LVL 3 NONREINFORCE (GOWNS) ×1 IMPLANT
KIT TURNOVER KIT A (KITS) ×1 IMPLANT
NDL HYPO 25X1 1.5 SAFETY (NEEDLE) ×1 IMPLANT
NEEDLE HYPO 25X1 1.5 SAFETY (NEEDLE) ×1 IMPLANT
NS IRRIG 500ML POUR BTL (IV SOLUTION) ×1 IMPLANT
PACK BASIN MINOR ARMC (MISCELLANEOUS) ×1 IMPLANT
STOCKINETTE IMPERVIOUS 9X36 MD (GAUZE/BANDAGES/DRESSINGS) ×1 IMPLANT
ULTRAGLIDE CTR (BLADE) ×1 IMPLANT
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
## 2023-11-26 NOTE — Anesthesia Postprocedure Evaluation (Signed)
 Anesthesia Post Note  Patient: [name]  Procedure(s) Performed: CARPAL TUNNEL RELEASE (Left)  Patient location during evaluation: PACU Anesthesia Type: General Level of consciousness: awake and alert Pain management: pain level controlled Vital Signs Assessment: post-procedure vital signs reviewed and stable Respiratory status: spontaneous breathing, nonlabored ventilation, respiratory function stable and patient connected to nasal cannula oxygen Cardiovascular status: blood pressure returned to baseline and stable Postop Assessment: no apparent nausea or vomiting Anesthetic complications: no   No notable events documented.   Last Vitals:  Vitals:   11/26/23 0835 11/26/23 0842  BP:  (!) 167/81  Pulse: 62   Resp: 14   Temp: 36.6 C   SpO2: 100% 100%    Last Pain:  Vitals:   11/26/23 0835  TempSrc: Temporal  PainSc: 5                  Debby Mines

## 2023-11-26 NOTE — Transfer of Care (Signed)
 Immediate Anesthesia Transfer of Care Note  Patient: [name]  Procedure(s) Performed: CARPAL TUNNEL RELEASE (Left)  Patient Location: PACU  Anesthesia Type:MAC  Level of Consciousness: awake, alert , and oriented  Airway & Oxygen Therapy: Patient Spontanous Breathing  Post-op Assessment: Report given to RN and Post -op Vital signs reviewed and stable  Post vital signs: Reviewed and stable  Last Vitals:  Vitals Value Taken Time  BP 119/69 0752  Temp 35.8 0752  Pulse 62 11/26/23 07:51  Resp 14 11/26/23 07:51  SpO2 100 % 11/26/23 07:51  Vitals shown include unfiled device data.  Last Pain:  Vitals:   11/26/23 0618  PainSc: 0-No pain         Complications: No notable events documented.

## 2023-11-26 NOTE — Op Note (Signed)
 Indications: Patient with a history of median neuropathy at the wrist with hand weakness refractory to conservative management.  Findings: Severe compression of the median nerve at the transverse carpal ligament  Preoperative Diagnosis:G56.02 left carpal tunnel syndrome  Postoperative Diagnosis: G56.02 left carpal tunnel syndrome   Postoperative Diagnosis: same   EBL: Minimal IVF: See anesthesia report Drains: none Disposition:Stable to PACU Complications: none  No foley catheter was placed.   Preoperative Note: patient with a history of progressive left median neuropathy with hand weakness refractory to conservative management.  They had tried rest, padding, and watchful waiting but had continued progressive symptoms.  Given the progression of her median neuropathy plan was made for median nerve decompression  Risk of surgery is discussed and include: Infection, bleeding, wound healing issues, pillar pain nerve injury, pain, failure to relieve the symptoms, need for further surgery.  Procedure:  1) left sided carpal tunnel decompression with ultrasound guidance   Procedure: After obtaining informed consent, the patient taken to the operating room, placed in supine position, monitored anesthesia care was induced.  They were given preoperative antibiotics.  Prepped and draped in the usual fashion.  Comprehensive timeout was performed verifying the patient's name, MRN, planned procedure.  An ultrasound was used with a sterile probe.  We used this to mark out our safety points including the interval between the ulnar artery and median nerve.  We identified the motor branch as well as the first sensory branch which were both in safe position.  We also identified the vascular arcade which was in safe positioning as well.  At this point we placed a block with Marcaine without epinephrine.  We blocked the skin where the incision would be, the superficial sensory median nerve, as well as the  transverse carpal ligament.  Under ultrasound guidance we utilized the local anesthetic to perform a hydrodissection of the nerve from the transverse carpal ligament.  We then prepped the sonexs ultra CTR knife on the back table while the anesthetic set in.  We performed a small linear incision approximately 2 to 3 mm.  We then utilized a Statistician under ultrasound guidance to identify the underside of the transverse carpal ligament.  The fat and connective tissue was dissected off the underside.  We could feel that this was quite thickened and calcified.  Causing severe compression.  Once we had a clear tract we then placed the ultrasound-guided knife into the incision and advanced it through the carpal tunnel.  We verified the safety zones including the median nerve which did not significantly cross over the knife.  We are able to see the first sensory branch which was not crossing the knife.  The artery was also in a safe place.  At this point we divided the transverse carpal ligament under ultrasound guidance, to get a full release it took 2 passes.  The nerve relaxed laterally and was well decompressed.  After the 2 passes we placed a [place] 4 into the wound and were able to feel a complete dissection of the transverse carpal ligament.  We then irrigated, we got meticulous hemostasis.  Skin glue was placed on the incision and a Band-Aid was placed on top once this was dried.  No immediate complications.  Sponge and pattie counts were correct at the end of the procedure.   I performed the procedure without an assistant surgeon  Lovenia Kim, MD/MSCR

## 2023-11-26 NOTE — Discharge Instructions (Addendum)
 Your surgeon has performed an operation on the nerves in your upper extremity. Many times, patients feel better immediately after surgery and can "overdo it." Even if you feel well, it is important that you follow these activity guidelines. If you do not let your nerve heal properly from the surgery, you can increase the chance of return of your symptoms. The following are instructions to help in your recovery once you have been discharged from the hospital.  It is normal for your nerves to feel increased "tingling" after surgery or a sensation that the nerve is "waking up".  This generally will resolve in a short period of time, within 1 to 2 weeks.  * It is ok to take NSAIDs after surgery.  Activity    Increase physical activity slowly as tolerated.  Taking short walks is encouraged, but avoid strenuous exercise. Do not jog, run, bicycle, lift weights, or participate in any other exercises unless specifically allowed by your doctor. Avoid prolonged sitting, including car rides.  For the first few days after surgery, avoid any use of the extremity more than a glass of water or for eating.  Specific instructions were given to you based off of your specific procedure.  You should not drive until no longer taking any new pain medication.  You may shower three days after your surgery.  After showering, lightly dab your incision dry. Do not take a tub bath or go swimming for 3 weeks, or until approved by your doctor at your follow-up appointment.  If you smoke, we strongly recommend that you quit.  Smoking has been proven to interfere with normal healing in your back and will dramatically reduce the success rate of your surgery. Please contact [facility] (800-QUIT-NOW) and use the resources at www.[facility].com for assistance in stopping smoking.  Surgical Incision   If you have a dressing on your incision, you may remove it three days after your surgery. Keep your incision area clean and dry.  Your  sutures are under your skin and your wound is covered with surgical glue.  The glue should begin to peel away within about a week.   Diet            You may return to your usual diet. Be sure to stay hydrated.  When to Contact us  Although your surgery and recovery will likely be uneventful, you may have some residual numbness, aches, and pains in your back and/or legs. This is normal and should improve in the next few weeks.  However, should you experience any of the following, contact us immediately: New numbness or weakness Pain that is progressively getting worse, and is not relieved by your pain medications or rest Bleeding, redness, swelling, pain, or drainage from surgical incision Chills or flu-like symptoms Fever greater than 101.0 F (38.3 C) Problems with bowel or bladder functions Difficulty breathing or shortness of breath Warmth, tenderness, or swelling in your calf  Contact Information How to contact us:  If you have any questions/concerns before or after surgery, you can reach [place] at [phone], or you can send a mychart message. We can be reached by phone or mychart 8am-4pm, Monday-Friday.  *Please note: Calls after 4pm are forwarded to a third party answering service. Mychart messages are not routinely monitored during evenings, weekends, and holidays. Please call our office to contact the answering service for urgent concerns during non-business hours.

## 2023-11-26 NOTE — Interval H&P Note (Signed)
 History and Physical Interval Note:  11/26/2023 7:01 AM  [name]  has presented today for surgery, with the diagnosis of G56.02 left carpal tunnel syndrome.  The various methods of treatment have been discussed with the patient and family. After consideration of risks, benefits and other options for treatment, the patient has consented to  Procedure(s) with comments: CARPAL TUNNEL RELEASE (Left) - LEFT CARPAL TUNNEL RELEASE WITH ULTRASOUND GUIDANCE as a surgical intervention.  The patient's history has been reviewed, patient examined, no change in status, stable for surgery.  I have reviewed the patient's chart and labs.  Questions were answered to the patient's satisfaction.    Heart and lungs clear    Penne LELON Sharps

## 2023-11-27 ENCOUNTER — Encounter: Payer: Self-pay | Admitting: Neurosurgery

## 2023-11-30 ENCOUNTER — Ambulatory Visit: Payer: Self-pay | Admitting: Family Medicine

## 2023-11-30 NOTE — Progress Notes (Signed)
 Left shoulder x-ray shows a little bit of arthritis.  No broken bones.

## 2023-11-30 NOTE — Progress Notes (Signed)
 Right shoulder x-ray shows no broken bones.  A little bit of arthritis is present.

## 2023-12-02 ENCOUNTER — Telehealth: Payer: Self-pay

## 2023-12-02 MED ORDER — CEPHALEXIN 500 MG PO CAPS: 500.0000 mg | ORAL_CAPSULE | Freq: Two times a day (BID) | ORAL | 0 refills | Status: AC

## 2023-12-02 NOTE — Telephone Encounter (Signed)
 [name] called in to our office w/ incision concerns. He thinks he has been over doing it. He noticed pain and redness last night, so he put some neosporin and a bandaid on it and took gabapentin  and ibuprofen , which helped some. He has also been using ice intermittently.   He also put a bandage on it for work today and when he removed it, there was a red spot on the bandaid.  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  He denies fever/chills.  He is going to send a picture through [employer].  His pharmacy is [facility] if we need to send in any medications.

## 2023-12-02 NOTE — Telephone Encounter (Signed)
 Per discussion with Dr Claudene, I have sent in keflex  500mg  BID x 7 days and instructed him to continue his gabapentin  and ibuprofen  and try to minimize how much activity and dishwashing he is doing. He has a scheduled follow up with our office on 12/09/23. I also advised him to contact us  if his incision/symptoms worsen between now and his appointment. He verbalized understanding.

## 2023-12-08 NOTE — Progress Notes (Unsigned)
   REFERRING PHYSICIAN:  Allwardt, Mardy HERO, Pa-c [address] [place],  [place] [address]  DOS: 11/26/23  left sided carpal tunnel decompression with ultrasound guidance   HISTORY OF PRESENT ILLNESS: JAYSTIN MCGARVEY is 2 weeks status post above surgery. Given *** on discharge from the hospital.   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 Keflex  was sent to his pharmacy.      He is scheduled for right CTR on 12/31/23.    PHYSICAL EXAMINATION:  NEUROLOGICAL:  General: In no acute distress.   Awake, alert, oriented to person, place, and time.  Pupils equal round and reactive to light.  Facial tone is symmetric.    Strength: Side Biceps Triceps Deltoid Interossei Grip Wrist Ext. Wrist Flex.  R 5 5 5 5 5 5 5   L 5 5 5 5 5 5 5    Incision c/d/i  Imaging:  Nothing new to review.   Assessment / Plan: DRAXTON LUU is doing well s/p above surgery. Treatment options reviewed with patient and following plan made:   - He can slowly return to activity as tolerated.  - Reviewed wound care.  - Follow up as scheduled for postop for his right CTR.   Advised to contact the office if any questions or concerns arise.   Glade Boys PA-C Dept of Neurosurgery

## 2023-12-09 ENCOUNTER — Encounter: Admitting: Physician Assistant

## 2023-12-09 ENCOUNTER — Ambulatory Visit (INDEPENDENT_AMBULATORY_CARE_PROVIDER_SITE_OTHER): Admitting: Emergency Medicine

## 2023-12-09 ENCOUNTER — Encounter: Payer: Self-pay | Admitting: Emergency Medicine

## 2023-12-09 VITALS — BP 120/68 | HR 70 | Temp 97.9°F | Ht 71.0 in | Wt 148.0 lb

## 2023-12-09 DIAGNOSIS — R911 Solitary pulmonary nodule: Secondary | ICD-10-CM | POA: Diagnosis not present
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
## 2023-12-09 NOTE — Assessment & Plan Note (Signed)
 His groundglass pulmonary nodule in the right upper lobe has resolved on CT chest from May.  I reassured him about this.  He has a 4 mm left lower lobe pulmonary nodule that should not need to be followed based on his minimal tobacco history, negative family history.  We will not schedule any dedicated follow-up.  We could reconsider if there is new abnormal imaging or if he has new symptoms.  He can follow-up in our office as needed

## 2023-12-09 NOTE — Patient Instructions (Signed)
 We reviewed your CT scan of the chest today.  The hazy pulmonary nodule of interest has resolved.  This is great news.  There is a small 4 mm nodule that will not need to be followed.  You do not need a repeat CT scan of your chest unless there is a change in symptoms or other indication. Follow Dr. Shelah if you develop any shortness of breath, respiratory symptoms.

## 2023-12-10 ENCOUNTER — Encounter: Payer: Self-pay | Admitting: Orthopedic Surgery

## 2023-12-10 ENCOUNTER — Ambulatory Visit (INDEPENDENT_AMBULATORY_CARE_PROVIDER_SITE_OTHER): Admitting: Orthopedic Surgery

## 2023-12-10 VITALS — BP 136/72 | Temp 98.1°F | Ht 71.0 in | Wt 150.0 lb

## 2023-12-10 DIAGNOSIS — Z9889 Other specified postprocedural states: Secondary | ICD-10-CM

## 2023-12-10 DIAGNOSIS — Z09 Encounter for follow-up examination after completed treatment for conditions other than malignant neoplasm: Secondary | ICD-10-CM

## 2023-12-10 DIAGNOSIS — G5602 Carpal tunnel syndrome, left upper limb: Secondary | ICD-10-CM

## 2023-12-14 ENCOUNTER — Ambulatory Visit: Admitting: Physician Assistant

## 2023-12-17 ENCOUNTER — Encounter: Payer: Self-pay | Admitting: Physician Assistant

## 2023-12-17 ENCOUNTER — Ambulatory Visit (INDEPENDENT_AMBULATORY_CARE_PROVIDER_SITE_OTHER): Admitting: Physician Assistant

## 2023-12-17 VITALS — BP 120/60 | HR 65 | Temp 97.5°F | Ht 71.0 in | Wt 150.0 lb

## 2023-12-17 DIAGNOSIS — G5603 Carpal tunnel syndrome, bilateral upper limbs: Secondary | ICD-10-CM

## 2023-12-17 DIAGNOSIS — R42 Dizziness and giddiness: Secondary | ICD-10-CM | POA: Diagnosis not present

## 2023-12-17 DIAGNOSIS — D649 Anemia, unspecified: Secondary | ICD-10-CM

## 2023-12-17 DIAGNOSIS — R7989 Other specified abnormal findings of blood chemistry: Secondary | ICD-10-CM | POA: Diagnosis not present

## 2023-12-17 DIAGNOSIS — G44329 Chronic post-traumatic headache, not intractable: Secondary | ICD-10-CM

## 2023-12-17 DIAGNOSIS — R296 Repeated falls: Secondary | ICD-10-CM

## 2023-12-17 DIAGNOSIS — I10 Essential (primary) hypertension: Secondary | ICD-10-CM | POA: Diagnosis not present

## 2023-12-17 LAB — HEPATIC FUNCTION PANEL
ALT: 24 U/L (ref 0–53)
AST: 22 U/L (ref 0–37)
Albumin: 3.9 g/dL (ref 3.5–5.2)
Alkaline Phosphatase: 54 U/L (ref 39–117)
Bilirubin, Direct: 0.1 mg/dL (ref 0.0–0.3)
Total Bilirubin: 0.5 mg/dL (ref 0.2–1.2)
Total Protein: 5.8 g/dL — ABNORMAL LOW (ref 6.0–8.3)

## 2023-12-17 LAB — CBC WITH DIFFERENTIAL/PLATELET
Basophils Absolute: 0 K/uL (ref 0.0–0.1)
Basophils Relative: 0.6 % (ref 0.0–3.0)
Eosinophils Absolute: 0.3 K/uL (ref 0.0–0.7)
Eosinophils Relative: 4 % (ref 0.0–5.0)
HCT: 37.6 % — ABNORMAL LOW (ref 39.0–52.0)
Hemoglobin: 12.8 g/dL — ABNORMAL LOW (ref 13.0–17.0)
Lymphocytes Relative: 15.8 % (ref 12.0–46.0)
Lymphs Abs: 1.1 K/uL (ref 0.7–4.0)
MCHC: 34 g/dL (ref 30.0–36.0)
MCV: 91.6 fl (ref 78.0–100.0)
Monocytes Absolute: 0.7 K/uL (ref 0.1–1.0)
Monocytes Relative: 10.3 % (ref 3.0–12.0)
Neutro Abs: 5 K/uL (ref 1.4–7.7)
Neutrophils Relative %: 69.3 % (ref 43.0–77.0)
Platelets: 197 K/uL (ref 150.0–400.0)
RBC: 4.11 Mil/uL — ABNORMAL LOW (ref 4.22–5.81)
RDW: 15.1 % (ref 11.5–15.5)
WBC: 7.2 K/uL (ref 4.0–10.5)

## 2023-12-17 LAB — IBC + FERRITIN
Ferritin: 9.7 ng/mL — ABNORMAL LOW (ref 22.0–322.0)
Iron: 85 ug/dL (ref 42–165)
Saturation Ratios: 18.9 % — ABNORMAL LOW (ref 20.0–50.0)
TIBC: 450.8 ug/dL — ABNORMAL HIGH (ref 250.0–450.0)
Transferrin: 322 mg/dL (ref 212.0–360.0)

## 2023-12-17 MED ORDER — IRBESARTAN 75 MG PO TABS: 75.0000 mg | ORAL_TABLET | Freq: Every day | ORAL | 2 refills | Status: DC
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
## 2023-12-22 ENCOUNTER — Ambulatory Visit: Payer: Self-pay | Admitting: Physician Assistant

## 2023-12-24 ENCOUNTER — Other Ambulatory Visit: Payer: Self-pay

## 2023-12-24 ENCOUNTER — Encounter: Payer: Self-pay | Admitting: Neurosurgery

## 2023-12-24 ENCOUNTER — Encounter
Admission: RE | Admit: 2023-12-24 | Discharge: 2023-12-24 | Disposition: A | Discharge status: Home / Self Care | Source: Ambulatory Visit | Attending: Neurosurgery | Admitting: Neurosurgery

## 2023-12-24 NOTE — Patient Instructions (Addendum)
 Your procedure is scheduled on: 12/31/2023  Report to the Registration Desk on the 1st floor of the [facility]. To find out your arrival time, please call [phone] between 1PM - 3PM on: 12/30/2023,  Wednesday If your arrival time is 6:00 am, do not arrive before that time as the [facility] entrance doors do not open until 6:00 am.  REMEMBER: Instructions that are not followed completely may result in serious medical risk, up to and including death; or upon the discretion of your surgeon and anesthesiologist your surgery may need to be rescheduled.  Do not eat food after midnight the night before surgery.  No gum chewing or hard candies.  You may however, drink CLEAR liquids up to 2 hours before you are scheduled to arrive for your surgery. Do not drink anything within 2 hours of your scheduled arrival time.  Clear liquids include: - water  - apple juice without pulp - gatorade (not RED colors) - black coffee or tea (Do NOT add milk or creamers to the coffee or tea) Do NOT drink anything that is not on this list.   One week prior to surgery: Stop Anti-inflammatories (NSAIDS) such as Advil , Aleve , Ibuprofen , Motrin , Naproxen , Naprosyn  and Aspirin based products such as Excedrin, Goody's Powder, BC Powder. Stop ANY OVER THE COUNTER supplements until after surgery.  You may however, continue to take Tylenol  if needed for pain up until the day of surgery.   Hold Viagra  two days before surgery.   Last dose is 8/25/ 2025    Continue taking all of your other prescription medications up until the day of surgery.  ON THE DAY OF SURGERY ONLY TAKE THESE MEDICATIONS WITH SIPS OF WATER:  propranolol  (INDERAL ) as needed    No Alcohol  for 24 hours before or after surgery.  No Smoking including e-cigarettes for 24 hours before surgery.  No chewable tobacco products for at least 6 hours before surgery.  No nicotine patches on the day of surgery.  Do not use any recreational drugs  for at least a week (preferably 2 weeks) before your surgery.  Please be advised that the combination of cocaine and anesthesia may have negative outcomes, up to and including death. If you test positive for cocaine, your surgery will be cancelled.  On the morning of surgery brush your teeth with toothpaste and water, you may rinse your mouth with mouthwash if you wish. Do not swallow any toothpaste or mouthwash.  Use CHG Soap or wipes as directed on instruction sheet.-provided for you   Do not wear jewelry, make-up, hairpins, clips or nail polish.   Do not wear lotions, powders, or perfumes.   Do not shave body hair from the neck down 48 hours before surgery.  Contact lenses, hearing aids and dentures may not be worn into surgery.  Do not bring valuables to the hospital. [facility] is not responsible for any missing/lost belongings or valuables.    Notify your doctor if there is any change in your medical condition (cold, fever, infection).  Wear comfortable clothing (specific to your surgery type) to the hospital.  After surgery, you can help prevent lung complications by doing breathing exercises.  Take deep breaths and cough every 1-2 hours. Your doctor may order a device called an Incentive Spirometer to help you take deep breaths. If you are being admitted to the hospital overnight, leave your suitcase in the car. After surgery it may be brought to your room.   If you are being  discharged the day of surgery, you will not be allowed to drive home. You will need a responsible individual to drive you home and stay with you for 24 hours after surgery.   If you are taking public transportation, you will need to have a responsible individual with you.  Please call the Pre-admissions Testing Dept. at [phone] if you have any questions about these instructions.  Surgery Visitation Policy:  Patients having surgery or a procedure may have two visitors.  Children under the  age of 20 must have an adult with them who is not the patient.   Merchandiser, retail to address health-related social needs:  [email or web address]                                                                                                               FOLLOW the INSTRUCTIONS BELOW for the SOAP      Preparing for Surgery with CHLORHEXIDINE  GLUCONATE (CHG) Soap  Chlorhexidine  Gluconate (CHG) Soap  o An antiseptic cleaner that kills germs and bonds with the skin to continue killing germs even after washing  o Used for showering the night before surgery and morning of surgery  Before surgery, you can play an important role by reducing the number of germs on your skin.  CHG (Chlorhexidine  gluconate) soap is an antiseptic cleanser which kills germs and bonds with the skin to continue killing germs even after washing.  Please do not use if you have an allergy to CHG or antibacterial soaps. If your skin becomes reddened/irritated stop using the CHG.  1. Shower the NIGHT BEFORE SURGERY and the MORNING OF SURGERY with CHG soap.  2. If you choose to wash your hair, wash your hair first as usual with your normal shampoo.  3. After shampooing, rinse your hair and body thoroughly to remove the shampoo.  4. Use CHG as you would any other liquid soap. You can apply CHG directly to the skin and wash gently with a scrungie or a clean washcloth.  5. Apply the CHG soap to your body only from the neck down. Do not use on open wounds or open sores. Avoid contact with your eyes, ears, mouth, and genitals (private parts). Wash face and genitals (private parts) with your normal soap.  6. Wash thoroughly, paying special attention to the area where your surgery will be performed.  7. Thoroughly rinse your body with warm water.  8. Do not shower/wash with your normal soap after using and rinsing off the CHG soap.  9. Pat yourself dry with a clean towel.  10. Wear clean  pajamas to bed the night before surgery.  12. Place clean sheets on your bed the night of your first shower and do not sleep with pets.  13. Shower again with the CHG soap on the day of surgery prior to arriving at the hospital.  14. Do not apply any deodorants/lotions/powders.  15. Please wear clean clothes to the hospital.

## 2023-12-25 DIAGNOSIS — H5213 Myopia, bilateral: Secondary | ICD-10-CM | POA: Diagnosis not present

## 2023-12-31 ENCOUNTER — Other Ambulatory Visit: Payer: Self-pay

## 2023-12-31 ENCOUNTER — Ambulatory Visit
Admission: RE | Admit: 2023-12-31 | Discharge: 2023-12-31 | Disposition: A | Discharge status: Home / Self Care | Attending: Neurosurgery | Admitting: Neurosurgery

## 2023-12-31 ENCOUNTER — Encounter
Admission: RE | Disposition: A | Discharge status: Home / Self Care | Payer: Self-pay | Source: Home / Self Care | Attending: Neurosurgery

## 2023-12-31 ENCOUNTER — Encounter: Payer: Self-pay | Admitting: Neurosurgery

## 2023-12-31 ENCOUNTER — Ambulatory Visit: Payer: Self-pay | Admitting: Urgent Care

## 2023-12-31 ENCOUNTER — Ambulatory Visit: Admitting: Certified Registered"

## 2023-12-31 DIAGNOSIS — Z01818 Encounter for other preprocedural examination: Secondary | ICD-10-CM

## 2023-12-31 DIAGNOSIS — I10 Essential (primary) hypertension: Secondary | ICD-10-CM | POA: Insufficient documentation

## 2023-12-31 DIAGNOSIS — Z87891 Personal history of nicotine dependence: Secondary | ICD-10-CM | POA: Diagnosis not present

## 2023-12-31 DIAGNOSIS — G5602 Carpal tunnel syndrome, left upper limb: Secondary | ICD-10-CM

## 2023-12-31 DIAGNOSIS — G5601 Carpal tunnel syndrome, right upper limb: Secondary | ICD-10-CM | POA: Diagnosis present

## 2023-12-31 HISTORY — PX: CARPAL TUNNEL RELEASE: SHX101

## 2023-12-31 HISTORY — DX: Carpal tunnel syndrome, right upper limb: G56.01

## 2023-12-31 SURGERY — CARPAL TUNNEL RELEASE
Anesthesia: General | Laterality: Right

## 2023-12-31 MED ORDER — FENTANYL CITRATE (PF) 100 MCG/2ML IJ SOLN
INTRAMUSCULAR | Status: AC
Start: 2023-12-31 — End: 2023-12-31
  Filled 2023-12-31: qty 2

## 2023-12-31 MED ORDER — LIDOCAINE HCL 1 % IJ SOLN
INTRAMUSCULAR | Status: DC | PRN
  Administered 2023-12-31: 3 mL

## 2023-12-31 MED ORDER — HYDROCODONE-ACETAMINOPHEN 5-325 MG PO TABS
1.0000 | ORAL_TABLET | Freq: Four times a day (QID) | ORAL | 0 refills | Status: AC | PRN
  Filled 2023-12-31: qty 20, 5d supply, fill #0

## 2023-12-31 MED ORDER — DEXAMETHASONE SODIUM PHOSPHATE 10 MG/ML IJ SOLN
INTRAMUSCULAR | Status: DC | PRN
  Administered 2023-12-31: 10 mg via INTRAVENOUS

## 2023-12-31 MED ORDER — MIDAZOLAM HCL 2 MG/2ML IJ SOLN
INTRAMUSCULAR | Status: DC | PRN
Start: 2023-12-31 — End: 2023-12-31
  Administered 2023-12-31: 2 mg via INTRAVENOUS

## 2023-12-31 MED ORDER — OXYCODONE HCL 5 MG PO TABS
5.0000 mg | ORAL_TABLET | Freq: Once | ORAL | Status: AC | PRN
  Administered 2023-12-31: 5 mg via ORAL

## 2023-12-31 MED ORDER — OXYCODONE HCL 5 MG/5ML PO SOLN: 5.0000 mg | Freq: Once | ORAL | Status: AC | PRN

## 2023-12-31 MED ORDER — PROPOFOL 500 MG/50ML IV EMUL
INTRAVENOUS | Status: DC | PRN
  Administered 2023-12-31: 125 ug/kg/min via INTRAVENOUS

## 2023-12-31 MED ORDER — KETOROLAC TROMETHAMINE 30 MG/ML IJ SOLN
INTRAMUSCULAR | Status: DC | PRN
  Administered 2023-12-31: 15 mg via INTRAVENOUS

## 2023-12-31 MED ORDER — CHLORHEXIDINE GLUCONATE 0.12 % MT SOLN
15.0000 mL | Freq: Once | OROMUCOSAL | Status: AC
  Administered 2023-12-31: 15 mL via OROMUCOSAL

## 2023-12-31 MED ORDER — MIDAZOLAM HCL 2 MG/2ML IJ SOLN
INTRAMUSCULAR | Status: AC
  Filled 2023-12-31: qty 2

## 2023-12-31 MED ORDER — CHLORHEXIDINE GLUCONATE 0.12 % MT SOLN
OROMUCOSAL | Status: AC
  Filled 2023-12-31: qty 15

## 2023-12-31 MED ORDER — PROPOFOL 1000 MG/100ML IV EMUL
INTRAVENOUS | Status: AC
  Filled 2023-12-31: qty 100

## 2023-12-31 MED ORDER — ORAL CARE MOUTH RINSE: 15.0000 mL | Freq: Once | OROMUCOSAL | Status: AC

## 2023-12-31 MED ORDER — LIDOCAINE HCL (PF) 1 % IJ SOLN
INTRAMUSCULAR | Status: AC
  Filled 2023-12-31: qty 30

## 2023-12-31 MED ORDER — DEXMEDETOMIDINE HCL IN NACL 80 MCG/20ML IV SOLN
INTRAVENOUS | Status: DC | PRN
  Administered 2023-12-31: 12 ug via INTRAVENOUS

## 2023-12-31 MED ORDER — LIDOCAINE HCL (CARDIAC) PF 100 MG/5ML IV SOSY
PREFILLED_SYRINGE | INTRAVENOUS | Status: DC | PRN
Start: 2023-12-31 — End: 2023-12-31
  Administered 2023-12-31: 100 mg via INTRAVENOUS

## 2023-12-31 MED ORDER — CEFAZOLIN IN SODIUM CHLORIDE 2-0.9 GM/100ML-% IV SOLN
2.0000 g | Freq: Once | INTRAVENOUS | Status: AC
  Administered 2023-12-31: 2 g via INTRAVENOUS
  Filled 2023-12-31: qty 100

## 2023-12-31 MED ORDER — ONDANSETRON HCL 4 MG/2ML IJ SOLN
INTRAMUSCULAR | Status: DC | PRN
  Administered 2023-12-31: 4 mg via INTRAVENOUS

## 2023-12-31 MED ORDER — 0.9 % SODIUM CHLORIDE (POUR BTL) OPTIME
TOPICAL | Status: DC | PRN
  Administered 2023-12-31: 100 mL

## 2023-12-31 MED ORDER — CEFAZOLIN SODIUM-DEXTROSE 2-4 GM/100ML-% IV SOLN
INTRAVENOUS | Status: AC
  Filled 2023-12-31: qty 100

## 2023-12-31 MED ORDER — BUPIVACAINE-EPINEPHRINE (PF) 0.5% -1:200000 IJ SOLN
INTRAMUSCULAR | Status: AC
  Filled 2023-12-31: qty 20

## 2023-12-31 MED ORDER — ACETAMINOPHEN 10 MG/ML IV SOLN
INTRAVENOUS | Status: DC | PRN
  Administered 2023-12-31: 1000 mg via INTRAVENOUS

## 2023-12-31 MED ORDER — LACTATED RINGERS IV SOLN: INTRAVENOUS | Status: DC

## 2023-12-31 MED ORDER — OXYCODONE HCL 5 MG PO TABS
ORAL_TABLET | ORAL | Status: AC
  Filled 2023-12-31: qty 1

## 2023-12-31 MED ORDER — FENTANYL CITRATE (PF) 100 MCG/2ML IJ SOLN
25.0000 ug | INTRAMUSCULAR | Status: DC | PRN
  Administered 2023-12-31 (×3): 25 ug via INTRAVENOUS

## 2023-12-31 SURGICAL SUPPLY — 19 items
BNDG ADH 1X3 SHEER STRL LF (GAUZE/BANDAGES/DRESSINGS) ×1 IMPLANT
BRUSH SCRUB EZ 4% CHG (MISCELLANEOUS) ×1 IMPLANT
CHLORAPREP W/TINT 26 (MISCELLANEOUS) ×1 IMPLANT
COVER PROBE FLX POLY STRL (MISCELLANEOUS) ×1 IMPLANT
DERMABOND ADVANCED .7 DNX12 (GAUZE/BANDAGES/DRESSINGS) ×1 IMPLANT
DRAPE EXTREMITY 106X87X128.5 (DRAPES) ×1 IMPLANT
DRAPE IMP U-DRAPE 54X76 (DRAPES) ×1 IMPLANT
DRAPE SHEET LG 3/4 BI-LAMINATE (DRAPES) ×1 IMPLANT
GLOVE BIOGEL PI IND STRL 8 (GLOVE) ×1 IMPLANT
GLOVE SRG 8 PF TXTR STRL LF DI (GLOVE) ×1 IMPLANT
GLOVE SURG SYN 7.5 PF PI (GLOVE) ×1 IMPLANT
GOWN SRG XL LVL 3 NONREINFORCE (GOWNS) ×1 IMPLANT
KIT TURNOVER KIT A (KITS) ×1 IMPLANT
NDL HYPO 25X1 1.5 SAFETY (NEEDLE) ×1 IMPLANT
NEEDLE HYPO 25X1 1.5 SAFETY (NEEDLE) ×1 IMPLANT
NS IRRIG 500ML POUR BTL (IV SOLUTION) ×1 IMPLANT
PACK BASIN MINOR ARMC (MISCELLANEOUS) ×1 IMPLANT
STOCKINETTE IMPERVIOUS 9X36 MD (GAUZE/BANDAGES/DRESSINGS) ×1 IMPLANT
ULTRAGLIDE CTR (BLADE) ×1 IMPLANT

## 2023-12-31 NOTE — Op Note (Signed)
 Indications: Patient with a history of median neuropathy at the wrist with hand weakness refractory to conservative management.  Findings: Severe compression of the median nerve at the transverse carpal ligament  Preoperative Diagnosis:G56.01 right carpal tunnel syndrome  Postoperative Diagnosis: G56.01 right carpal tunnel syndrome   Postoperative Diagnosis: same   EBL: Minimal IVF: See anesthesia report Drains: none Disposition:Stable to PACU Complications: none  No foley catheter was placed.   Preoperative Note: patient with a history of progressive right median neuropathy with hand weakness refractory to conservative management.  They had tried rest, padding, and watchful waiting but had continued progressive symptoms.  Given the progression of her median neuropathy plan was made for median nerve decompression  Risk of surgery is discussed and include: Infection, bleeding, wound healing issues, pillar pain nerve injury, pain, failure to relieve the symptoms, need for further surgery.  Procedure:  1) right sided carpal tunnel decompression with ultrasound guidance   Procedure: After obtaining informed consent, the patient taken to the operating room, placed in supine position, monitored anesthesia care was induced.  They were given preoperative antibiotics.  Prepped and draped in the usual fashion.  Comprehensive timeout was performed verifying the patient's name, MRN, planned procedure.  An ultrasound was used with a sterile probe.  We used this to mark out our safety points including the interval between the ulnar artery and median nerve.  We identified the motor branch as well as the first sensory branch which were both in safe position.  We also identified the vascular arcade which was in safe positioning as well.  At this point we placed a block with Marcaine without epinephrine.  We blocked the skin where the incision would be, the superficial sensory median nerve, as well as  the transverse carpal ligament.  Under ultrasound guidance we utilized the local anesthetic to perform a hydrodissection of the nerve from the transverse carpal ligament.  We then prepped the sonexs ultra CTR knife on the back table while the anesthetic set in.  We performed a small linear incision approximately 2 to 3 mm.  We then utilized a Statistician under ultrasound guidance to identify the underside of the transverse carpal ligament.  The fat and connective tissue was dissected off the underside.  We could feel that this was quite thickened and calcified.  Causing severe compression.  Once we had a clear tract we then placed the ultrasound-guided knife into the incision and advanced it through the carpal tunnel.  We verified the safety zones including the median nerve which did not significantly cross over the knife.  We are able to see the first sensory branch which was not crossing the knife.  The artery was also in a safe place.  At this point we divided the transverse carpal ligament under ultrasound guidance, to get a full release it took 2 passes.  The nerve relaxed laterally and was well decompressed.  After the 2 passes we placed a [place] 4 into the wound and were able to feel a complete dissection of the transverse carpal ligament.  We then irrigated, we got meticulous hemostasis.  Skin glue was placed on the incision and a Band-Aid was placed on top once this was dried.  No immediate complications.  Sponge and pattie counts were correct at the end of the procedure.   I performed the procedure without an assistant surgeon  Lovenia Kim, MD/MSCR
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
## 2023-12-31 NOTE — Interval H&P Note (Signed)
 History and Physical Interval Note:  12/31/2023 7:09 AM  [name]  has presented today for surgery, with the diagnosis of G56.01 right carpal tunnel syndrome.  The various methods of treatment have been discussed with the patient and family. After consideration of risks, benefits and other options for treatment, the patient has consented to  Procedure(s) with comments: CARPAL TUNNEL RELEASE (Right) - RIGHT CARPAL TUNNEL RELEASE WITH ULTRASOUND GUIDANCE as a surgical intervention.  The patient's history has been reviewed, patient examined, no change in status, stable for surgery.  I have reviewed the patient's chart and labs.  Questions were answered to the patient's satisfaction.    Heart and lungs clear    [name]

## 2023-12-31 NOTE — Transfer of Care (Signed)
 Immediate Anesthesia Transfer of Care Note  Patient: [name]  Procedure(s) Performed: CARPAL TUNNEL RELEASE (Right)  Patient Location: PACU  Anesthesia Type:General  Level of Consciousness: awake  Airway & Oxygen Therapy: Patient Spontanous Breathing  Post-op Assessment: Report given to RN  Post vital signs: stable  Last Vitals:  Vitals Value Taken Time  BP 104/47 12/31/23 07:48  Temp    Pulse 50 12/31/23 07:50  Resp 12 12/31/23 07:50  SpO2 100 % 12/31/23 07:50  Vitals shown include unfiled device data.  Last Pain:  Vitals:   12/31/23 0633  TempSrc: Temporal  PainSc: 7          Complications: There were no known notable events for this encounter.

## 2023-12-31 NOTE — Discharge Instructions (Addendum)

## 2023-12-31 NOTE — Anesthesia Postprocedure Evaluation (Signed)
 Anesthesia Post Note  Patient: [name]  Procedure(s) Performed: CARPAL TUNNEL RELEASE (Right)  Patient location during evaluation: PACU Anesthesia Type: General Level of consciousness: awake and alert Pain management: pain level controlled Vital Signs Assessment: post-procedure vital signs reviewed and stable Respiratory status: spontaneous breathing, nonlabored ventilation and respiratory function stable Cardiovascular status: blood pressure returned to baseline and stable Postop Assessment: no apparent nausea or vomiting Anesthetic complications: no   There were no known notable events for this encounter.   Last Vitals:  Vitals:   12/31/23 0850 12/31/23 0917  BP: (!) 149/71 (!) 150/70  Pulse: (!) 53 (!) 55  Resp: 16 14  Temp: (!) 36.2 C (!) 36.3 C  SpO2: 99% 100%    Last Pain:  Vitals:   12/31/23 0917  TempSrc: Temporal  PainSc:                  Fairy POUR Tianah Lonardo
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
## 2024-01-01 ENCOUNTER — Encounter: Payer: Self-pay | Admitting: Neurosurgery

## 2024-01-05 ENCOUNTER — Other Ambulatory Visit: Payer: Self-pay | Admitting: Physician Assistant

## 2024-01-05 DIAGNOSIS — G4709 Other insomnia: Secondary | ICD-10-CM

## 2024-01-11 ENCOUNTER — Other Ambulatory Visit: Payer: Self-pay | Admitting: Physician Assistant

## 2024-01-11 DIAGNOSIS — G4709 Other insomnia: Secondary | ICD-10-CM

## 2024-01-12 ENCOUNTER — Other Ambulatory Visit: Payer: Self-pay

## 2024-01-12 ENCOUNTER — Telehealth: Payer: Self-pay | Admitting: Family Medicine

## 2024-01-12 MED ORDER — GABAPENTIN 100 MG PO CAPS: 100.0000 mg | ORAL_CAPSULE | Freq: Three times a day (TID) | ORAL | 3 refills | Status: DC | PRN

## 2024-01-12 MED ORDER — GABAPENTIN 300 MG PO CAPS: 300.0000 mg | ORAL_CAPSULE | Freq: Three times a day (TID) | ORAL | 1 refills | Status: DC | PRN

## 2024-01-12 NOTE — Addendum Note (Signed)
 Addended by: GEROME ILEANA RAMAN on: 01/12/2024 12:53 PM   Modules accepted: Orders

## 2024-01-12 NOTE — Telephone Encounter (Signed)
 Called to clarify w/ pt the dosage of Gabapentin  he has been taking. He stated that he's been taking 300mg  tid. He is finding this rx helpful for the burning pain he's experiencing. Advised we should switch and do 300mg  capsules that he could take 1 capsule tid prn. Pt verbalized understanding

## 2024-01-12 NOTE — Telephone Encounter (Signed)
 Patient called requesting a refill on gabapentin  (NEURONTIN ) 100 MG capsule.  Per patient, he does not have any refills left. I called the pharmacy to confirm and they state that he has used all refills that were given when it was sent in on 11/24/23.  Please advise.

## 2024-01-13 ENCOUNTER — Encounter: Payer: Self-pay | Admitting: Physician Assistant

## 2024-01-13 ENCOUNTER — Ambulatory Visit (INDEPENDENT_AMBULATORY_CARE_PROVIDER_SITE_OTHER): Admitting: Physician Assistant

## 2024-01-13 VITALS — BP 120/78 | Temp 97.7°F

## 2024-01-13 DIAGNOSIS — Z09 Encounter for follow-up examination after completed treatment for conditions other than malignant neoplasm: Secondary | ICD-10-CM

## 2024-01-13 DIAGNOSIS — G5603 Carpal tunnel syndrome, bilateral upper limbs: Secondary | ICD-10-CM

## 2024-01-13 NOTE — Progress Notes (Signed)
   REFERRING PHYSICIAN:  Allwardt, Mardy HERO, Pa-c [address] [place],  [place] [address]  DOS: 11/26/23  left sided carpal tunnel decompression with ultrasound guidance            12/31/23 right sided carpal tunnel decompression with ultrasound guidance  HISTORY OF PRESENT ILLNESS: [name] is 6 weeks status post left sided carpal tunnel release and 2 weeks status post right sided carpal tunnel release.  He has seen improvement in his preop numbness and tingling.  He denies any pain currently.  He does have some new numbness in the tip of his left middle finger since surgery.  Overall has been tolerating everything well.    PHYSICAL EXAMINATION:  NEUROLOGICAL:  General: In no acute distress.   Awake, alert, oriented to person, place, and time.  Pupils equal round and reactive to light.  Facial tone is symmetric.    Examination is stable.  Good strength.  Incision c/d/i  Imaging:  Nothing new to review.   Assessment / Plan: [name] is doing well s/p above surgeries. Treatment options reviewed with patient and following plan made:   - He can slowly return to activity as tolerated.  - Reviewed wound care.  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 -Counseled on neurologic recovery status post carpal tunnel syndrome surgery in the sometime delayed improvement - Follow up as scheduled   Advised to contact the office if any questions or concerns arise.   Lyle Decamp PA-C Dept of Neurosurgery

## 2024-01-27 ENCOUNTER — Ambulatory Visit (INDEPENDENT_AMBULATORY_CARE_PROVIDER_SITE_OTHER): Admitting: Family Medicine

## 2024-01-27 ENCOUNTER — Other Ambulatory Visit: Payer: Self-pay

## 2024-01-27 VITALS — BP 150/76 | HR 69 | Ht 71.0 in | Wt 154.0 lb

## 2024-01-27 DIAGNOSIS — M545 Low back pain, unspecified: Secondary | ICD-10-CM

## 2024-01-27 DIAGNOSIS — M25511 Pain in right shoulder: Secondary | ICD-10-CM

## 2024-01-27 DIAGNOSIS — M25512 Pain in left shoulder: Secondary | ICD-10-CM | POA: Diagnosis not present

## 2024-01-27 DIAGNOSIS — G8929 Other chronic pain: Secondary | ICD-10-CM | POA: Diagnosis not present

## 2024-01-27 NOTE — Progress Notes (Signed)
 I, Leotis Batter, CMA acting as a scribe for Artist Lloyd, MD.  [name] is a 68 y.o. male who presents to [facility] today for exacerbation of his bilat shoulder pain. Pt was last seen by Dr. Lloyd on 11/24/23 and was advised to hold off on injections, do to upcoming carpal tunnel surgery.  Today, pt reports he notes tenderness on the superior aspect of his R shoulder. Pain w/ overhead motion and decreased strength.  . Additionally he notes chronic right low back pain.  He denies significant pain rating down his leg.  Pain is worse with activity better with rest.  He denies any injury.  Dx imaging: 11/24/23 R & L shoulder XR  Pertinent review of systems: No fevers or chills  Relevant historical information: Hypertension.  Bilateral shoulder pain.   Exam:  BP (!) 150/76   Pulse 69   Ht 5' 11 (1.803 m)   Wt 154 lb (69.9 kg)   SpO2 97%   BMI 21.48 kg/m  General: Well Developed, well nourished, and in no acute distress.   MSK: Right shoulder nodule visible superior portion of shoulder around [name] joint.  This is tender to palpation.  Decreased range of motion.  Left shoulder normal-appearing decreased range of motion pain with abduction.  L-spine: Normal-appearing nontender palpation midline decreased lumbar motion.  Lab and Radiology Results  Procedure: Real-time Ultrasound Guided Injection of right shoulder AC joint Device: Philips Affiniti 50G/GE Logiq Images permanently stored and available for review in PACS Verbal informed consent obtained.  Discussed risks and benefits of procedure. Warned about infection, bleeding, hyperglycemia damage to structures among others. Patient expresses understanding and agreement Time-out conducted.   Noted no overlying erythema, induration, or other signs of local infection.   Skin prepped in a sterile fashion.   Local anesthesia: Topical Ethyl chloride.   With sterile technique and under real time  ultrasound guidance: 40 mg of Kenalog  and 1 mL of Marcaine  injected into AC joint. Fluid seen entering the joint capsule.   Completed without difficulty   Pain immediately resolved suggesting accurate placement of the medication.   Advised to call if fevers/chills, erythema, induration, drainage, or persistent bleeding.   Images permanently stored and available for review in the ultrasound unit.  Impression: Technically successful ultrasound guided injection.    Procedure: Real-time Ultrasound Guided Injection of left shoulder subacromial bursa Device: Philips Affiniti 50G/GE Logiq Images permanently stored and available for review in PACS Verbal informed consent obtained.  Discussed risks and benefits of procedure. Warned about infection, bleeding, hyperglycemia damage to structures among others. Patient expresses understanding and agreement Time-out conducted.   Noted no overlying erythema, induration, or other signs of local infection.   Skin prepped in a sterile fashion.   Local anesthesia: Topical Ethyl chloride.   With sterile technique and under real time ultrasound guidance: 40 mg of Kenalog  and 2 mL of Marcaine  injected into subacromial bursa. Fluid seen entering the bursa.   Completed without difficulty   Pain immediately resolved suggesting accurate placement of the medication.   Advised to call if fevers/chills, erythema, induration, drainage, or persistent bleeding.   Images permanently stored and available for review in the ultrasound unit.  Impression: Technically successful ultrasound guided injection.        Assessment and Plan: 68 y.o. male with chronic bilateral shoulder pain.  Right shoulder predominantly due to [facility] DJD left shoulder due to subacromial bursitis and impingement.  Plan for right [facility]  joint injection and left subacromial injection.  Additionally plan to refer to physical therapy as this could help shoulder pain long-term.  Additionally he does note  some low back pain attributable to muscle spasm and dysfunction.  Plan also to refer to PT for this.  Check back as needed.   PDMP not reviewed this encounter. Orders Placed This Encounter  Procedures   US  LIMITED JOINT SPACE STRUCTURES UP BILAT(NO LINKED CHARGES)    Reason for Exam (SYMPTOM  OR DIAGNOSIS REQUIRED):   bilat shoulder pain    Preferred imaging location?:   [facility] referral to Physical Therapy    Referral Priority:   Routine    Referral Type:   Physical Medicine    Referral Reason:   Specialty Services Required    Requested Specialty:   Physical Therapy    Number of Visits Requested:   1   No orders of the defined types were placed in this encounter.    Discussed warning signs or symptoms. Please see discharge instructions. Patient expresses understanding.   The above documentation has been reviewed and is accurate and complete Artist Lloyd, M.D.

## 2024-01-27 NOTE — Patient Instructions (Addendum)
 Thank you for coming in today.   You received an injection today. Seek immediate medical attention if the joint becomes red, extremely painful, or is oozing fluid.   I've referred you to Physical Therapy here, at this office.  You will hear from our office soon about scheduling, once we check with your insurance company.   Check back in 6 weeks

## 2024-01-28 ENCOUNTER — Encounter: Payer: Self-pay | Admitting: Physician Assistant

## 2024-02-02 ENCOUNTER — Ambulatory Visit: Admitting: Physical Therapy

## 2024-02-03 ENCOUNTER — Encounter: Payer: Self-pay | Admitting: Physician Assistant

## 2024-02-08 ENCOUNTER — Encounter: Payer: Self-pay | Admitting: Neurosurgery

## 2024-02-08 ENCOUNTER — Ambulatory Visit: Admitting: Neurosurgery

## 2024-02-08 VITALS — BP 128/78 | Temp 97.6°F

## 2024-02-08 DIAGNOSIS — G5603 Carpal tunnel syndrome, bilateral upper limbs: Secondary | ICD-10-CM

## 2024-02-08 DIAGNOSIS — Z09 Encounter for follow-up examination after completed treatment for conditions other than malignant neoplasm: Secondary | ICD-10-CM

## 2024-02-08 NOTE — Progress Notes (Signed)
   REFERRING PHYSICIAN:  Allwardt, Mardy HERO, Pa-c [address] [place],  [place] [address]  DOS: 11/26/23  left sided carpal tunnel decompression with ultrasound guidance            12/31/23 right sided carpal tunnel decompression with ultrasound guidance  HISTORY OF PRESENT ILLNESS: [name] is following up after his bilateral carpal tunnel decompression.  He has left side done first in his right side done afterwards.  He states that his symptoms of gotten significantly better.  He does not have any pain.  He states that he is had improvement in his functionality.  His dexterity is improved.  He states that he is able to drive again.  He does have some numbness in the tip of his left middle finger but has not had any worsening.  He feels like it slowly getting better.   PHYSICAL EXAMINATION:  NEUROLOGICAL:  General: In no acute distress.   Awake, alert, oriented to person, place, and time.  Pupils equal round and reactive to light.  Facial tone is symmetric.    Examination is stable.  Good strength.  Incision c/d/i  Imaging:  Nothing new to review.   Assessment / Plan: [name] is doing well s/p above surgeries. Treatment options reviewed with patient and following plan made:   - Return to activity as tolerated - Reviewed wound care.  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  Advised to contact the office if any questions or concerns arise.   Penne MICAEL Sharps, MD Dept of Neurosurgery

## 2024-02-11 ENCOUNTER — Other Ambulatory Visit: Payer: Self-pay | Admitting: Physician Assistant

## 2024-02-11 ENCOUNTER — Ambulatory Visit: Admitting: Physical Therapy

## 2024-02-11 ENCOUNTER — Ambulatory Visit
Admission: RE | Admit: 2024-02-11 | Discharge: 2024-02-11 | Disposition: A | Source: Ambulatory Visit | Attending: Physician Assistant

## 2024-02-11 DIAGNOSIS — S0990XA Unspecified injury of head, initial encounter: Secondary | ICD-10-CM | POA: Diagnosis not present

## 2024-02-11 DIAGNOSIS — G44329 Chronic post-traumatic headache, not intractable: Secondary | ICD-10-CM

## 2024-02-11 DIAGNOSIS — R296 Repeated falls: Secondary | ICD-10-CM

## 2024-02-11 DIAGNOSIS — R42 Dizziness and giddiness: Secondary | ICD-10-CM

## 2024-02-22 ENCOUNTER — Other Ambulatory Visit: Payer: Self-pay

## 2024-02-22 ENCOUNTER — Telehealth: Payer: Self-pay

## 2024-02-22 DIAGNOSIS — F1021 Alcohol dependence, in remission: Secondary | ICD-10-CM

## 2024-02-22 DIAGNOSIS — F419 Anxiety disorder, unspecified: Secondary | ICD-10-CM

## 2024-02-22 NOTE — Telephone Encounter (Signed)
 Copied from CRM #[id number]. Topic: Referral - Request for Referral >> Feb 22, 2024  9:52 AM Willma R wrote: Did the patient discuss referral with their provider in the last year? Yes  Appointment offered? No  Type of order/referral and detailed reason for visit: Behavioral Health  Preference of office, provider, location:  [facility] [address] [name], [place], [place] [address] [phone]  If referral order, have you been seen by this specialty before? Yes, many years ago in the 59's.  Can we respond through MyChart? Yes  Called pt to advise referral requested has been placed; advised to allow 7-10 business days for scheduling. Pt also aware to keep an eye on MyChart Letter for office contact to call to schedule himself.

## 2024-02-23 ENCOUNTER — Other Ambulatory Visit: Payer: Self-pay

## 2024-02-23 ENCOUNTER — Encounter: Payer: Self-pay | Admitting: Physical Therapy

## 2024-02-23 ENCOUNTER — Ambulatory Visit: Admitting: Physical Therapy

## 2024-02-23 DIAGNOSIS — M25512 Pain in left shoulder: Secondary | ICD-10-CM

## 2024-02-23 DIAGNOSIS — G8929 Other chronic pain: Secondary | ICD-10-CM

## 2024-02-23 DIAGNOSIS — M5459 Other low back pain: Secondary | ICD-10-CM

## 2024-02-23 DIAGNOSIS — M6281 Muscle weakness (generalized): Secondary | ICD-10-CM | POA: Diagnosis not present

## 2024-02-23 DIAGNOSIS — M542 Cervicalgia: Secondary | ICD-10-CM

## 2024-02-23 DIAGNOSIS — M25511 Pain in right shoulder: Secondary | ICD-10-CM
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
## 2024-02-23 NOTE — Patient Instructions (Signed)
 Access Code: [email or web address] URL: [email or web address] Date: 02/23/2024 Prepared by: Elaine Daring  Exercises - Supine Lower Trunk Rotation  - 1 x daily - 10 reps - 5 seconds hold - Supine Piriformis Stretch with Foot on Ground  - 1 x daily - 3 reps - 15 seconds hold - Sidelying Thoracic Rotation  - 1 x daily - 10 reps - 5 seconds hold - Seated Cervical Sidebending Stretch  - 1 x daily - 3 reps - 15 seconds hold - Seated Hamstring Stretch  - 1 x daily - 3 reps - 15 seconds hold - Standing Gastroc Stretch at Counter  - 1 x daily - 3 reps - 15 seconds hold - Step Back Shoulder Stretch with Chair  - 1 x daily - 10 reps - 5 seconds hold

## 2024-02-29 ENCOUNTER — Other Ambulatory Visit: Payer: Self-pay | Admitting: Physician Assistant

## 2024-02-29 ENCOUNTER — Ambulatory Visit: Payer: Self-pay

## 2024-02-29 NOTE — Telephone Encounter (Signed)
 FYI see triage note

## 2024-02-29 NOTE — Telephone Encounter (Signed)
 Noted and agreed, thank you.

## 2024-02-29 NOTE — Telephone Encounter (Signed)
 FYI Only or Action Required?: FYI only for provider.  Patient was last seen in primary care on 12/17/2023 by Allwardt, Mardy HERO, PA-C.  Called Nurse Triage reporting foot pain.  Symptoms began several days ago.  Interventions attempted: Nothing.  Symptoms are: stable.  Triage Disposition: See PCP When Office is Open (Within 3 Days)  Patient/caregiver understands and will follow disposition?: Yes Reason for Disposition  [1] MODERATE pain (e.g., interferes with normal activities, limping) AND [2] present > 3 days  Answer Assessment - Initial Assessment Questions Patient states made appointment with Podiatrist 11/7.   1. ONSET: When did the pain start?      5 days ago  2. LOCATION: Where is the pain located?      Left foot, tips of toes  3. PAIN: How bad is the pain?    (Scale 1-10; or mild, moderate, severe)     Yes, and burning sensation in bottom of foot at night  4. WORK OR EXERCISE: Has there been any recent work or exercise that involved this part of the body?      Denies  5. CAUSE: What do you think is causing the foot pain?     Unsure  6. OTHER SYMPTOMS: Do you have any other symptoms? (e.g., leg pain, rash, fever, numbness)     Rash appeared last night above outside left ankle.  Protocols used: Foot Pain-A-AH  Copied from CRM [id number]. Topic: Clinical - Red Word Triage >> Feb 29, 2024 12:21 PM Suzen RAMAN wrote: Red Word that prompted transfer to Nurse Triage: pain in left foot followed by  burning sensation at night, discovered rash on ankle above left foot last night as well. Schedule an appt with podiatrist. Increased urination also requesting a urology referral.

## 2024-02-29 NOTE — Telephone Encounter (Unsigned)
 Copied from CRM #[id number]. Topic: Clinical - Medication Refill >> Feb 29, 2024 12:18 PM Suzen RAMAN wrote: Medication: sildenafil  (VIAGRA ) 100 MG tablet   Has the patient contacted their pharmacy? Yes  This is the patient's preferred pharmacy:  [id number] [place], [place] - [address] [place] [address] Phone: [phone] Fax: [phone]  Is this the correct pharmacy for this prescription? Yes If no, delete pharmacy and type the correct one.   Has the prescription been filled recently? No  Is the patient out of the medication? Yes  Has the patient been seen for an appointment in the last year OR does the patient have an upcoming appointment? Yes  Can we respond through MyChart? No  Agent: Please be advised that Rx refills may take up to 3 business days. We ask that you follow-up with your pharmacy.

## 2024-03-01 ENCOUNTER — Encounter: Payer: Self-pay | Admitting: Family Medicine

## 2024-03-01 ENCOUNTER — Ambulatory Visit: Payer: Self-pay

## 2024-03-01 ENCOUNTER — Ambulatory Visit (INDEPENDENT_AMBULATORY_CARE_PROVIDER_SITE_OTHER): Admitting: Family Medicine

## 2024-03-01 VITALS — BP 112/60 | HR 65 | Temp 98.1°F | Resp 14 | Ht 71.0 in | Wt 151.6 lb

## 2024-03-01 DIAGNOSIS — M79672 Pain in left foot: Secondary | ICD-10-CM | POA: Diagnosis not present

## 2024-03-01 DIAGNOSIS — N529 Male erectile dysfunction, unspecified: Secondary | ICD-10-CM

## 2024-03-01 DIAGNOSIS — B351 Tinea unguium: Secondary | ICD-10-CM

## 2024-03-01 DIAGNOSIS — M2042 Other hammer toe(s) (acquired), left foot: Secondary | ICD-10-CM | POA: Diagnosis not present

## 2024-03-01 DIAGNOSIS — Z23 Encounter for immunization: Secondary | ICD-10-CM | POA: Diagnosis not present

## 2024-03-01 MED ORDER — SILDENAFIL CITRATE 100 MG PO TABS: 100.0000 mg | ORAL_TABLET | Freq: Every day | ORAL | 2 refills | Status: AC | PRN

## 2024-03-01 MED ORDER — TERBINAFINE HCL 250 MG PO TABS: 250.0000 mg | ORAL_TABLET | Freq: Every day | ORAL | 0 refills | Status: DC

## 2024-03-01 MED ORDER — SILDENAFIL CITRATE 100 MG PO TABS: 100.0000 mg | ORAL_TABLET | Freq: Every day | ORAL | 2 refills | Status: DC | PRN

## 2024-03-01 MED ORDER — GABAPENTIN 300 MG PO CAPS: ORAL_CAPSULE | ORAL | 1 refills | Status: DC

## 2024-03-01 NOTE — Telephone Encounter (Signed)
 Pt was seen this morning with Kennyth. Do they need another appt? Please advise

## 2024-03-01 NOTE — Patient Instructions (Signed)
 It was very nice to see you today!  VISIT SUMMARY: During your visit, we discussed your foot pain, rash, low back pain, toenail fungus, and erectile dysfunction. We reviewed your symptoms and made adjustments to your treatment plan to help manage your conditions more effectively.  YOUR PLAN: HAMMER TOE DEFORMITY WITH ASSOCIATED METATARSALGIA AND CALLUS FORMATION: Your foot pain is due to a hammer toe deformity, which is causing pressure and callus formation. -Use metatarsal pads or hammer toe splints to reduce pressure and pain. -Wear looser shoes to accommodate the pads and reduce pressure. -Follow up with a podiatrist or sports medicine specialist for further evaluation.  LOW BACK PAIN: Your chronic low back pain has worsened recently and may involve nerve pain radiating to your foot. -Increase your gabapentin  dosage as needed for pain control, allowing for an extra dose if required. -Continue physical therapy with caution and discuss your exercise tolerance with your therapist.  ONYCHOMYCOSIS: You have a chronic toenail fungus causing toenail deformity and discomfort. -Start taking terbinafine  250 mg orally once daily for 6 weeks.  ERECTILE DYSFUNCTION: You have chronic erectile dysfunction. -Refill your sildenafil  prescription and use as needed.  Return if symptoms worsen or fail to improve.   Take care, Dr Kennyth  PLEASE NOTE:  If you had any lab tests, please let us  know if you have not heard back within a few days. You may see your results on mychart before we have a chance to review them but we will give you a call once they are reviewed by us .   If we ordered any referrals today, please let us  know if you have not heard from their office within the next week.   If you had any urgent prescriptions sent in today, please check with the pharmacy within an hour of our visit to make sure the prescription was transmitted appropriately.   Please try these tips to maintain a healthy  lifestyle:  Eat at least 3 REAL meals and 1-2 snacks per day.  Aim for no more than 5 hours between eating.  If you eat breakfast, please do so within one hour of getting up.   Each meal should contain half fruits/vegetables, one quarter protein, and one quarter carbs (no bigger than a computer mouse)  Cut down on sweet beverages. This includes juice, soda, and sweet tea.   Drink at least 1 glass of water with each meal and aim for at least 8 glasses per day  Exercise at least 150 minutes every week.

## 2024-03-01 NOTE — Telephone Encounter (Signed)
 Please see triage note on patient and advise

## 2024-03-01 NOTE — Progress Notes (Signed)
 [name] is a 68 y.o. male who presents today for an office visit.  Assessment/Plan:  Foot Pain / Hammertoe Deformity  Patient exam notable for loss of left transverse arch with hammertoe deformity.  Has significant amount of callus on distal aspect of toes as well as onychomycosis.  He has follow-up scheduled with sports medicine and podiatry though his hammertoe deformity seems to be main source of his pain at this point.  We discussed use of inserts/splints.  Will increase his gabapentin  to 300 mg 3 times daily with an extra 300 mg daily as needed.  He can continue taking the ibuprofen .  We are treating his onychomycosis as below.  Will defer further management to podiatry.  Onychomycosis  May be contributing to his above distal foot discomfort.  Will start course of terbinafine .  LFTs a few months ago were normal  Erectile Dysfunction  Stable on sildenafil  as needed.  Will refill today.  Chronic Back Pain   continue management per sports medicine.  He will see them soon.  He had exacerbation of symptoms after physical therapy.  Xerosis cutis Patient with a linear excoriation on foot.  No signs of infection.  Recommended emollients as needed.  Preventative health care-Prevnar 20 given today      Subjective:  HPI:  See assessment / plan for status of chronic conditions.   Discussed the use of AI scribe software for clinical note transcription with the patient, who gave verbal consent to proceed.  History of Present Illness [name] is a 68 year old male who presents with foot pain and a rash.  He has persistent foot pain described as 'killing me' across the top of his foot, particularly affecting his toes. A piece of nail gets caught on blankets while sleeping, causing discomfort. He has a history of toenail fungus, previously treated by a podiatrist, and recalls being told by the podiatrist that his toes are longer in the middle and that he may have a hammer  toe. He experiences burning pain on the bottom of his foot and has not tried any recent treatments except wearing loose shoes. 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  He reports a rash that started a week ago, initially presenting as itchy while he was in bed. He scratched it excessively, leading to further irritation. He is unsure of the cause but suspects it might be related to soap use.  His current medications include gabapentin  300 mg three times a day and over-the-counter ibuprofen , which he takes in a dose of 800 mg as needed for pain relief. Gabapentin  helps with the pain, although he is cautious about the timing and dosage to avoid overuse.  He has a history of pneumonia, which was severe enough to require a chest tube and a PICC line for antibiotic administration. He expresses concern about preventing future occurrences of pneumonia.         Objective:  Physical Exam: BP 112/60   Pulse 65   Temp 98.1 F (36.7 C) (Temporal)   Resp 14   Ht 5' 11 (1.803 m)   Wt 151 lb 9.6 oz (68.8 kg)   SpO2 97%   BMI 21.14 kg/m   Gen: No acute distress, resting comfortably MUSCULOSKELETAL: - Left Foot:  hammertoe deformity noted throughout 2nd through 5th digit with distal callus formation.  Loss of transverse arch noted.  Onychomycosis noted in his 2nd through 5th digits.  Neurovascular intact distally.  Tenderness to palpation along distal aspect of digits. Skin: Xerosis cutis noted on Dorsal aspect of left ankle with linear excoriation Neuro: Grossly normal, moves all extremities Psych: Normal affect and thought content      Bryan Crabtree M. Kennyth, MD 03/01/2024 9:46 AM

## 2024-03-01 NOTE — Telephone Encounter (Signed)
 Please contact pt and schedule for OV with any provider

## 2024-03-01 NOTE — Telephone Encounter (Signed)
 Please advise if ok to refill, pt last Rx sent 2023

## 2024-03-01 NOTE — Telephone Encounter (Signed)
 FYI Only or Action Required?: Action required by provider: request for appointment and clinical question for provider.  Patient was last seen in primary care on 03/01/2024 by Kennyth Worth HERO, MD.  Called Nurse Triage reporting Urinary Frequency.  Symptoms began several weeks ago.  Interventions attempted: Nothing.  Symptoms are: unchanged.  Triage Disposition: See Physician Within 24 Hours  Patient/caregiver understands and will follow disposition?: No, wishes to speak with PCP   Copied from CRM #[id number]. Topic: Clinical - Red Word Triage >> Mar 01, 2024 10:04 AM Rosina BIRCH wrote: Reason for RMF:zmzrupoz dysfunction, pain down below and frequent urination Reason for Disposition  Urinating more frequently than usual (i.e., frequency) OR new-onset of the feeling of an urgent need to urinate (i.e., urgency)  Answer Assessment - Initial Assessment Questions Offered appt today and tomorrow morning patient declines. Pt requests appt for Friday. Patient requesting call back and appointment.  Advised UC/ED if symptoms worsen.  1. SYMPTOM: What's the main symptom you're concerned about? (e.g., frequency, incontinence)     Frequency, weak flow Denies blood, painful 2. ONSET: When did the    start?     months 3. PAIN: Is there any pain? If Yes, ask: How bad is it? (Scale: 1-10; mild, moderate, severe)     Colon discomfort, aching pain, constant 4. CAUSE: What do you think is causing the symptoms?     prostate 5. OTHER SYMPTOMS: Do you have any other symptoms? (e.g., blood in urine, fever, flank pain, pain with urination)     lower back pain Denies blood in stool, last BM yesterday, Fever, chills, n/v  Protocols used: Urinary Symptoms-A-AH

## 2024-03-02 NOTE — Telephone Encounter (Signed)
 Pt seen no other appt needed at this time

## 2024-03-03 ENCOUNTER — Other Ambulatory Visit: Payer: Self-pay | Admitting: Physician Assistant

## 2024-03-03 ENCOUNTER — Encounter: Admitting: Physical Therapy

## 2024-03-03 NOTE — Telephone Encounter (Signed)
 Copied from CRM [id number]. Topic: Clinical - Medication Refill >> Mar 03, 2024 10:41 AM Taleah C wrote: Medication: irbesartan  150 mg  Has the patient contacted their pharmacy? Yes [employer] called  This is the patient's preferred pharmacy:  [id number] [place], [place] - [address] [place] [address] Phone: [phone] Fax: [phone]  Is this the correct pharmacy for this prescription? Yes If no, delete pharmacy and type the correct one.   Has the prescription been filled recently? Yes  Is the patient out of the medication? No  Has the patient been seen for an appointment in the last year OR does the patient have an upcoming appointment? Yes  Can we respond through MyChart? No  Agent: Please be advised that Rx refills may take up to 3 business days. We ask that you follow-up with your pharmacy.

## 2024-03-06 ENCOUNTER — Other Ambulatory Visit: Payer: Self-pay | Admitting: Emergency Medicine

## 2024-03-08 ENCOUNTER — Encounter: Admitting: Physical Therapy

## 2024-03-09 ENCOUNTER — Ambulatory Visit: Admitting: Family Medicine

## 2024-03-11 ENCOUNTER — Ambulatory Visit: Admitting: Orthopedic Surgery

## 2024-03-13 ENCOUNTER — Encounter: Payer: Self-pay | Admitting: Physician Assistant

## 2024-03-15 ENCOUNTER — Encounter: Admitting: Physical Therapy

## 2024-03-17 ENCOUNTER — Ambulatory Visit: Payer: Self-pay

## 2024-03-17 NOTE — Telephone Encounter (Signed)
 Noted

## 2024-03-17 NOTE — Telephone Encounter (Signed)
 FYI Only or Action Required?: FYI only for provider: appointment scheduled on 11/17.  Patient was last seen in primary care on 03/01/2024 by Kennyth Worth HERO, MD.  Called Nurse Triage reporting Urinary Frequency.  Symptoms began about a month ago.  Interventions attempted: Nothing.  Symptoms are: unchanged.  Triage Disposition: See PCP Within 2 Weeks  Patient/caregiver understands and will follow disposition?: Yes      Copied from CRM [id number]. Topic: Clinical - Red Word Triage >> Mar 17, 2024 10:24 AM Adelita BRAVO wrote: [facility] that prompted transfer to Nurse Triage: Potential UTI. Patient is having frequent urination with abdominal pain. Patient wanting prostate exam as well.       Reason for Disposition  Has to get out of bed to urinate > 2 times a night (i.e., nocturia)  Answer Assessment - Initial Assessment Questions 1. SYMPTOM: What's the main symptom you're concerned about? (e.g., frequency, incontinence)     Increased frequency stating he gets up multiple times at night  2. ONSET: When did the increased frequency start?     About a month ago  3. PAIN: Is there any pain? If Yes, ask: How bad is it? (Scale: 1-10; mild, moderate, severe)     No pain with urination  4. CAUSE: What do you think is causing the symptoms?     UTI or possibly a problem with his prostate   5. OTHER SYMPTOMS: Do you have any other symptoms? (e.g., blood in urine, fever, flank pain, pain with urination)     Some lower abdominal/bladder pain  Protocols used: Urinary Symptoms-A-AH

## 2024-03-21 ENCOUNTER — Ambulatory Visit: Admitting: Family Medicine

## 2024-03-21 ENCOUNTER — Ambulatory Visit (INDEPENDENT_AMBULATORY_CARE_PROVIDER_SITE_OTHER): Admitting: Family Medicine

## 2024-03-21 ENCOUNTER — Encounter: Payer: Self-pay | Admitting: Family Medicine

## 2024-03-21 VITALS — BP 120/60 | HR 64 | Temp 97.3°F | Ht 71.0 in | Wt 155.8 lb

## 2024-03-21 DIAGNOSIS — K5900 Constipation, unspecified: Secondary | ICD-10-CM | POA: Diagnosis not present

## 2024-03-21 DIAGNOSIS — R350 Frequency of micturition: Secondary | ICD-10-CM | POA: Diagnosis not present

## 2024-03-21 DIAGNOSIS — M79673 Pain in unspecified foot: Secondary | ICD-10-CM

## 2024-03-21 DIAGNOSIS — B351 Tinea unguium: Secondary | ICD-10-CM | POA: Diagnosis not present

## 2024-03-21 DIAGNOSIS — Z23 Encounter for immunization: Secondary | ICD-10-CM

## 2024-03-21 LAB — POCT URINALYSIS DIPSTICK
Bilirubin, UA: NEGATIVE
Blood, UA: NEGATIVE
Glucose, UA: POSITIVE — AB
Ketones, UA: NEGATIVE
Leukocytes, UA: NEGATIVE
Nitrite, UA: NEGATIVE
Protein, UA: NEGATIVE
Spec Grav, UA: >=1.03 — AB (ref 1.010–1.025)
Urobilinogen, UA: 0.2 U/dL
pH, UA: 6 (ref 5.0–8.0)

## 2024-03-21 LAB — POCT GLYCOSYLATED HEMOGLOBIN (HGB A1C): Hemoglobin A1C: 5.2 % (ref 4.0–5.6)

## 2024-03-21 MED ORDER — SULFAMETHOXAZOLE-TRIMETHOPRIM 800-160 MG PO TABS: 1.0000 | ORAL_TABLET | Freq: Two times a day (BID) | ORAL | 0 refills | Status: AC

## 2024-03-21 NOTE — Progress Notes (Deleted)
   LILLETTE Ileana Collet, PhD, LAT, ATC acting as a scribe for Artist Lloyd, MD.  [name] is a 68 y.o. male who presents to [facility] at [facility] today for exacerbation of his bilat shoulder pain. Pt was last seen by Dr. Lloyd on 01/27/24 and was given a R AC joint and L subacromial steroid injections and was referred to PT for his LBP.   Pt only completed 1 PT visit and then canceled all remaining visits.  Today, pt reports ***  Dx imaging: 11/24/23 R & L shoulder XR  Pertinent review of systems: ***  Relevant historical information: ***   Exam:  There were no vitals taken for this visit. General: Well Developed, well nourished, and in no acute distress.   MSK: ***    Lab and Radiology Results No results found for this or any previous visit (from the past 72 hours). No results found.     Assessment and Plan: 68 y.o. male with ***   PDMP not reviewed this encounter. No orders of the defined types were placed in this encounter.  No orders of the defined types were placed in this encounter.    Discussed warning signs or symptoms. Please see discharge instructions. Patient expresses understanding.   ***

## 2024-03-21 NOTE — Progress Notes (Addendum)
" ° °  [name] is a 68 y.o. male who presents today for an office visit.  Assessment/Plan:  Urinary Frequency  Urinalysis notable for glucose and elevated specific gravity however A1c is 5.3. Overall picture is concerning for prostatitis.  We discussed digital rectal exam today however he declined.  Most recent PSA was within normal ranges.  Will empirically start Bactrim  for 2-week course.  We are also checking urine culture today.  We encouraged hydration.  He will let us  know if not improving.  We discussed reasons to return to care.  Onychomycosis He discontinued the terbinafine  due to side effects.  Will defer further management to PCP.   Foot Pain Doing well with gabapentin  300 mg 3-4 times daily.  He will follow-up with sports medicine soon.   Constipation  Discussed conservative measures including increasing fluid intake.  He can also use stool softeners as needed.      Subjective:  HPI:  See assessment / plan for status of chronic conditions.  Discussed the use of AI scribe software for clinical note transcription with the patient, who gave verbal consent to proceed.  History of Present Illness [name] is a 68 year old male who presents with dizziness, urinary frequency, and lower abdominal pain.  He experiences persistent dizziness, particularly when standing, which impacts his ability to hear momentarily. This symptom is currently present and has been ongoing.  He has increased urinary frequency, getting up multiple times at night to urinate, with a 'good amount' of urine each time. This symptom has worsened recently.  He reports lower abdominal pain around the colon and testicular area, present for about one to two weeks, exacerbated by sitting. He is currently taking gabapentin  for pain and reports that taking two pills occasionally helps alleviate some of the pain in the lower abdominal area.  He experiences lower back pain that worsens when standing,  particularly while working in the kitchen. This pain has been worsening over the past couple of weeks. He reports ongoing muscle aches, particularly in the lower back, which persisted even after stopping the antifungal medication.  He has a history of erectile dysfunction and reports a recent episode where Viagra  worked after about 30 minutes. He is concerned about the ED and its impact on his life.  He previously took an antifungal medication for his feet, which he stopped due to feeling unwell shortly after taking it. He associates the onset of feeling 'like crap' with the medication and reports improvement after discontinuing it.  He reports occasional constipation, which he attributes to taking an iron pill, describing a sensation of tightness when attempting to have a bowel movement.         Objective:  Physical Exam: BP 120/60   Pulse 64   Temp (!) 97.3 F (36.3 C) (Temporal)   Ht 5' 11 (1.803 m)   Wt 155 lb 12.8 oz (70.7 kg)   SpO2 94%   BMI 21.73 kg/m   Gen: No acute distress, resting comfortably CV: Regular rate and rhythm with no murmurs appreciated Pulm: Normal work of breathing, clear to auscultation bilaterally with no crackles, wheezes, or rhonchi GU: Deferred Neuro: Grossly normal, moves all extremities Psych: Normal affect and thought content      Amadea Keagy M. Kennyth, MD 03/23/2024 10:41 AM  "

## 2024-03-21 NOTE — Patient Instructions (Addendum)
 It was very nice to see you today!  VISIT SUMMARY: During your visit, we discussed your dizziness, urinary frequency, lower abdominal pain, and lower back pain. We also addressed your concerns about erectile dysfunction and a previous adverse reaction to antifungal medication.  YOUR PLAN: PROSTATITIS WITH ASSOCIATED URINARY FREQUENCY AND ERECTILE DYSFUNCTION: Your symptoms suggest prostatitis, which is causing frequent urination and erectile dysfunction. -You have been prescribed antibiotics for two weeks. Please take them as directed. -A urine culture has been ordered to confirm the diagnosis. -If your symptoms do not improve, please report back to us . -We may consider a prostate examination if your symptoms persist.  ONYCHOMYCOSIS, RESOLVED ADVERSE REACTION TO ANTIFUNGAL THERAPY: You had an adverse reaction to the antifungal medication, which has resolved after stopping it. -We discussed alternative treatments for your toenail fungus. -You should not take the antifungal medication anymore.  LOWER BACK PAIN: Your chronic lower back pain has worsened and may be related to prostatitis. -You should continue with your sports medicine consultation for further evaluation. -Stay hydrated and consider using stool softeners if constipation continues.  Return if symptoms worsen or fail to improve.   Take care, Dr Kennyth  PLEASE NOTE:  If you had any lab tests, please let us  know if you have not heard back within a few days. You may see your results on mychart before we have a chance to review them but we will give you a call once they are reviewed by us .   If we ordered any referrals today, please let us  know if you have not heard from their office within the next week.   If you had any urgent prescriptions sent in today, please check with the pharmacy within an hour of our visit to make sure the prescription was transmitted appropriately.   Please try these tips to maintain a healthy  lifestyle:  Eat at least 3 REAL meals and 1-2 snacks per day.  Aim for no more than 5 hours between eating.  If you eat breakfast, please do so within one hour of getting up.   Each meal should contain half fruits/vegetables, one quarter protein, and one quarter carbs (no bigger than a computer mouse)  Cut down on sweet beverages. This includes juice, soda, and sweet tea.   Drink at least 1 glass of water with each meal and aim for at least 8 glasses per day  Exercise at least 150 minutes every week.

## 2024-03-22 ENCOUNTER — Encounter: Admitting: Physical Therapy

## 2024-03-22 LAB — URINE CULTURE
MICRO NUMBER:: 17244143
Result:: NO GROWTH
SPECIMEN QUALITY:: ADEQUATE

## 2024-03-23 ENCOUNTER — Telehealth (HOSPITAL_COMMUNITY): Payer: Self-pay | Admitting: Psychiatry

## 2024-03-23 ENCOUNTER — Ambulatory Visit: Payer: Self-pay | Admitting: Family Medicine

## 2024-03-23 NOTE — Progress Notes (Signed)
 Urine culture is negative.  He does not have a bladder infection but this does not rule out a prostate infection as we discussed in his office visit.  He should let us  know if his symptoms are not improving with antibiotics.

## 2024-03-23 NOTE — Telephone Encounter (Signed)
 D:  Pt was referred to virtual MH-IOP per his PCP.  A:  Placed call to orient pt, but there was no answer.  Left vm for pt to call the case manager back if he's interested in group.  Inform front desk Harles) who forwarded the referral to cm.

## 2024-03-23 NOTE — Progress Notes (Unsigned)
   LILLETTE Ileana Collet, PhD, LAT, ATC acting as a scribe for Artist Lloyd, MD.  TARUS BRISKI is a 68 y.o. male who presents to [facility] at [facility] today for exacerbation of his bilat shoulder pain. Pt was last seen by Dr. Lloyd on 01/27/24 and was given a R AC joint and L subacromial steroid injections and was referred to PT for his LBP.   Pt only completed 1 PT visit and then canceled all remaining visits.  Today, pt reports bilat shoulder pain returned over the last couple week. Soreness w/ should aBD. He d/c PT do to feeling sore the following day.  Pt also c/o LBP ongoing since last week. Pt notes this morning having a severe burning pain in his L foot. Pain is located along both sides of his low back.  Radiating pain: no LE numbness/tingling: no LE weakness: no Aggravates: Treatments tried: gabapentin , IBU  Dx imaging: 11/24/23 R & L shoulder XR  Pertinent review of systems: No fevers or chills.  Positive for diffuse body aches and pain.  Relevant historical information: Hypertension.   Exam:  BP 118/64   Pulse 67   Ht 5' 11 (1.803 m)   Wt 152 lb (68.9 kg)   SpO2 96%   BMI 21.20 kg/m  General: Well Developed, well nourished, and in no acute distress.   MSK: Diffusely tender to palpation across upper and lower extremities.  Normal C-spine and L-spine motion.  Lower extremity strength is intact.     Assessment and Plan: 68 y.o. male with diffuse myalgias and arthralgias. This could be multiple different orthopedic issues but a more systemic inflammatory condition is more likely.  Plan for rheumatologic workup listed below today.  Additionally will prescribe course of prednisone .  Additionally patient is experiencing paresthesias left foot thought to be neuropathy related.  Plan to increase gabapentin  up to 600 mg at bedtime.  Consider nerve conduction study in the future.  PDMP not reviewed this encounter. Orders Placed This Encounter   Procedures   ANA    Standing Status:   Future    Expiration Date:   03/24/2025   Cyclic citrul peptide antibody, IgG    Standing Status:   Future    Expiration Date:   03/24/2025   HLA-B27 antigen    Polyarthalgia    Standing Status:   Future    Expiration Date:   03/24/2025   Rheumatoid factor    Standing Status:   Future    Expiration Date:   03/24/2025   Sedimentation rate    Standing Status:   Future    Expiration Date:   03/24/2025   Meds ordered this encounter  Medications   predniSONE  (STERAPRED UNI-PAK 48 TAB) 10 MG (48) TBPK tablet    Sig: Take by mouth daily. 12 day dosepack po    Dispense:  48 tablet    Refill:  0     Discussed warning signs or symptoms. Please see discharge instructions. Patient expresses understanding.   The above documentation has been reviewed and is accurate and complete Artist Lloyd, M.D.

## 2024-03-24 ENCOUNTER — Other Ambulatory Visit: Payer: Self-pay

## 2024-03-24 ENCOUNTER — Ambulatory Visit: Admitting: Family Medicine

## 2024-03-24 VITALS — BP 118/64 | HR 67 | Ht 71.0 in | Wt 152.0 lb

## 2024-03-24 DIAGNOSIS — M791 Myalgia, unspecified site: Secondary | ICD-10-CM | POA: Diagnosis not present

## 2024-03-24 DIAGNOSIS — M25512 Pain in left shoulder: Secondary | ICD-10-CM | POA: Diagnosis not present

## 2024-03-24 DIAGNOSIS — M25511 Pain in right shoulder: Secondary | ICD-10-CM

## 2024-03-24 DIAGNOSIS — M2550 Pain in unspecified joint: Secondary | ICD-10-CM | POA: Diagnosis not present

## 2024-03-24 DIAGNOSIS — G8929 Other chronic pain: Secondary | ICD-10-CM

## 2024-03-24 DIAGNOSIS — R52 Pain, unspecified: Secondary | ICD-10-CM

## 2024-03-24 LAB — SEDIMENTATION RATE: Sed Rate: 7 mm/h (ref 0–20)

## 2024-03-24 MED ORDER — PREDNISONE 10 MG (48) PO TBPK: ORAL_TABLET | Freq: Every day | ORAL | 0 refills | Status: DC

## 2024-03-24 NOTE — Patient Instructions (Signed)
 Thank you for coming in today.   OK to increase gabapentin  to 600mg  at bedtime.  I've sent a prescription for prednisone  to your pharmacy.   Please get labs today before you leave

## 2024-03-25 ENCOUNTER — Ambulatory Visit: Payer: Self-pay | Admitting: Family Medicine

## 2024-03-25 DIAGNOSIS — M2550 Pain in unspecified joint: Secondary | ICD-10-CM

## 2024-03-25 DIAGNOSIS — M791 Myalgia, unspecified site: Secondary | ICD-10-CM

## 2024-03-27 LAB — ANA: Anti Nuclear Antibody (ANA): POSITIVE — AB

## 2024-03-27 LAB — RHEUMATOID FACTOR: Rheumatoid fact SerPl-aCnc: <10 [IU]/mL (ref <14)

## 2024-03-27 LAB — ANTI-NUCLEAR AB-TITER (ANA TITER): ANA Titer 1: 1:40 {titer} — ABNORMAL HIGH

## 2024-03-27 LAB — CYCLIC CITRUL PEPTIDE ANTIBODY, IGG: Cyclic Citrullin Peptide Ab: <16 U

## 2024-03-27 LAB — HLA-B27 ANTIGEN: HLA-B27 Antigen: NEGATIVE

## 2024-03-30 NOTE — Progress Notes (Signed)
 Rheumatology labs are little bit positive.  I will refer to rheumatology to see if we can figure out wha why you are hurting.

## 2024-04-05 ENCOUNTER — Ambulatory Visit: Admitting: Family Medicine

## 2024-04-07 ENCOUNTER — Other Ambulatory Visit: Payer: Self-pay | Admitting: Physician Assistant

## 2024-04-07 DIAGNOSIS — G4709 Other insomnia: Secondary | ICD-10-CM

## 2024-04-12 ENCOUNTER — Other Ambulatory Visit: Payer: Self-pay | Admitting: Family Medicine

## 2024-04-18 ENCOUNTER — Encounter: Payer: Self-pay | Admitting: Physician Assistant

## 2024-04-18 ENCOUNTER — Ambulatory Visit: Admitting: Physician Assistant

## 2024-04-18 VITALS — BP 108/64 | HR 66 | Temp 97.5°F | Ht 71.0 in | Wt 151.8 lb

## 2024-04-18 DIAGNOSIS — I951 Orthostatic hypotension: Secondary | ICD-10-CM

## 2024-04-18 DIAGNOSIS — N529 Male erectile dysfunction, unspecified: Secondary | ICD-10-CM

## 2024-04-18 DIAGNOSIS — R7689 Other specified abnormal immunological findings in serum: Secondary | ICD-10-CM

## 2024-04-18 DIAGNOSIS — D508 Other iron deficiency anemias: Secondary | ICD-10-CM

## 2024-04-18 DIAGNOSIS — R102 Pelvic and perineal pain unspecified side: Secondary | ICD-10-CM | POA: Diagnosis not present

## 2024-04-18 DIAGNOSIS — G4709 Other insomnia: Secondary | ICD-10-CM | POA: Diagnosis not present

## 2024-04-18 DIAGNOSIS — R350 Frequency of micturition: Secondary | ICD-10-CM
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
## 2024-05-11 ENCOUNTER — Other Ambulatory Visit: Payer: Self-pay | Admitting: Family Medicine

## 2024-05-16 ENCOUNTER — Ambulatory Visit: Payer: Self-pay

## 2024-05-16 NOTE — Telephone Encounter (Signed)
 FYI Only or Action Required?: FYI only for provider: appointment scheduled on 01.14.26.  Patient was last seen in primary care on 04/18/2024 by Allwardt, Mardy HERO, PA-C.  Called Nurse Triage reporting Anxiety.  Symptoms began several days ago.  Interventions attempted: Prescription medications: Irbesartan .  Symptoms are: gradually improving.  Triage Disposition: See PCP When Office is Open (Within 3 Days)  Patient/caregiver understands and will follow disposition?: Yes    Copied from CRM [id number]. Topic: Clinical - Red Word Triage >> May 16, 2024 11:04 AM Montie POUR wrote: Red Word that prompted transfer to Nurse Triage:  Since Saturday, his anxiety was really really bad; crying, depressed, high blood pressure, stressing out about sick dog and money issues. >> May 16, 2024 11:09 AM Montie POUR wrote: He feels some better today since his dog is okay and blood pressure going down some after he started taking his blood medications that his doctor took him off of Reason for Disposition  MODERATE anxiety (e.g., persistent or frequent anxiety symptoms; interferes with sleep, school, or work)  Answer Assessment - Initial Assessment Questions 1. CONCERN: Did anything happen that prompted you to call today?      Sick dog caused pt to be stressed, has anxiety, and depression  2. ANXIETY SYMPTOMS: Can you describe how you (your loved one; patient) have been feeling? (e.g., tense, restless, panicky, anxious, keyed up, overwhelmed, sense of impending doom).       A little better since onset. Pt finally received more sleep  3. ONSET: How long have you been feeling this way? (e.g., hours, days, weeks)     X a few days  4. SEVERITY: How would you rate the level of anxiety? (e.g., 0 - 10; or mild, moderate, severe). 5/10       5. FUNCTIONAL IMPAIRMENT: How have these feelings affected your ability to do daily activities? Have you had more difficulty than usual doing your normal daily  activities? (e.g., getting better, same, worse; self-care, school, work, interactions)      Better since onset. Took medications and dog is feeling better  6. HISTORY: Have you felt this way before? Have you ever been diagnosed with an anxiety problem in the past? (e.g., generalized anxiety disorder, panic attacks, PTSD). If Yes, ask: How was this problem treated? (e.g., medicines, counseling, etc.)     Anxiety and depression  7. RISK OF HARM - SUICIDAL IDEATION: Do you ever have thoughts of hurting or killing yourself? If Yes, ask:  Do you have these feelings now? Do you have a plan on how you would do this?      Denies SI/HI  8. TREATMENT:  What has been done so far to treat this anxiety? (e.g., medicines, relaxation strategies). What has helped?      Pt states he will take his anxiety medication        9. POTENTIAL TRIGGERS: Do you drink caffeinated beverages (e.g., coffee, colas, teas), and how much daily? Do you drink alcohol  or use any drugs? Have you started any new medicines recently?        Pt is having trouble sleeping having to take care of his sick dog  10. PATIENT SUPPORT: Who is with you now? Who do you live with? Do you have family or friends who you can talk to?         Pt states he only has him and his dog  11. OTHER SYMPTOMS: Do you have any other symptoms? (e.g., feeling depressed, trouble concentrating,  trouble sleeping, trouble breathing, palpitations or fast heartbeat, chest pain, sweating, nausea, or diarrhea)       Difficulty sleeping   Pt reports anxiety Pt is taking Irbesartan  for HTN that has resolved Pt scheduled for a visit on  01.12.26 for further evaluation. Pt agrees with plan of care, will call back for any worsening symptoms  Protocols used: Anxiety and Panic Attack-A-AH

## 2024-05-16 NOTE — Telephone Encounter (Signed)
 Pt scheduled with Kennyth 05/18/24

## 2024-05-16 NOTE — Telephone Encounter (Signed)
 Noted

## 2024-05-18 ENCOUNTER — Telehealth: Payer: Self-pay

## 2024-05-18 ENCOUNTER — Encounter: Payer: Self-pay | Admitting: Family Medicine

## 2024-05-18 ENCOUNTER — Ambulatory Visit: Admitting: Family Medicine

## 2024-05-18 VITALS — BP 130/82 | HR 58 | Temp 97.2°F | Ht 71.0 in | Wt 152.4 lb

## 2024-05-18 DIAGNOSIS — G5602 Carpal tunnel syndrome, left upper limb: Secondary | ICD-10-CM | POA: Diagnosis not present

## 2024-05-18 DIAGNOSIS — M4322 Fusion of spine, cervical region: Secondary | ICD-10-CM | POA: Diagnosis not present

## 2024-05-18 DIAGNOSIS — F411 Generalized anxiety disorder: Secondary | ICD-10-CM | POA: Diagnosis not present

## 2024-05-18 DIAGNOSIS — G5601 Carpal tunnel syndrome, right upper limb: Secondary | ICD-10-CM | POA: Diagnosis not present

## 2024-05-18 MED ORDER — IBUPROFEN 600 MG PO TABS: 600.0000 mg | ORAL_TABLET | Freq: Three times a day (TID) | ORAL | 0 refills | Status: AC | PRN

## 2024-05-18 MED ORDER — PROPRANOLOL HCL 20 MG PO TABS: 20.0000 mg | ORAL_TABLET | Freq: Every day | ORAL | 0 refills | Status: DC | PRN

## 2024-05-18 MED ORDER — CLOBETASOL PROPIONATE 0.05 % EX OINT: 1.0000 | TOPICAL_OINTMENT | Freq: Two times a day (BID) | CUTANEOUS | 0 refills | Status: AC

## 2024-05-18 NOTE — Telephone Encounter (Signed)
 Copied from CRM [id number]. Topic: Clinical - Prescription Issue >> May 18, 2024  4:38 PM Hadassah PARAS wrote: Reason for CRM: Pt went to pharm to pick up  clobetasol  ointment (TEMOVATE ) 0.05 % and it was $112. Pt is req an alternative medication. Please advise pt on #[id number]

## 2024-05-18 NOTE — Progress Notes (Signed)
 "  [name] is a 69 y.o. male who presents today for an office visit.  Assessment/Plan:  Rash Rash on arm and upper back consistent with psoriasis.  Differential also includes tinea though less likely.  Will start topical clobetasol .  Does have some component of xerosis cutis as well which is contributing.  We discussed importance of daily emollients.  He will let us  know if not improving in the next 1 to 2 weeks and would consider trial of topical antifungal versus biopsy versus referral to dermatology at that time.  Pruritus  Patient with diffuse pruritus intermittently.  Has significant xerosis cutis which is likely contributing.  We discussed daily emollients.  Is okay for him to take Zyrtec or Allegra as needed to help with the itching.  He will let us  know if not improving.  He did have labs done recently with rheumatology which were normal.  Generalized anxiety disorder Patient had a flareup of anxiety over the last several days due to a variety of stressors including issues with his pet dog and some financial stress as well.  He restarted the propranolol  a few days ago 20 mg daily and has done very well with this.  He feels like his anxiety is much better controlled now.  He will continue his current dose of propranolol  and let us  know if he needs any further assistance with this.  Essential Hypertension  He restarted the irbesartan  75 mg daily.  Blood pressure was initially elevated today but at goal on recheck.  He will continue current regimen for now and follow-up with the PCP if persistently elevated.  Lower urinary tract symptoms I saw him a couple of months ago for possible prostatitis.  He had some improvement with 2-week course of Bactrim  however followed up with his PCP and was subsequently referred to urology for persistence of increased urinary frequency.  No red flags.  No sudden systemic illness.  He will be seeing the urologist in a couple of days - we will defer  further management to them.  osteoarthritis Follows with sports medicine and recently saw a rheumatology.  Will refill his ibuprofen  today per patient request will defer further management to sports medicine.       Subjective:  HPI:  See assessment / plan for status of chronic conditions.   Discussed the use of AI scribe software for clinical note transcription with the patient, who gave verbal consent to proceed.  History of Present Illness [name] is a 69 year old male who presents with anxiety and a rash.  He has been experiencing increased anxiety due to several stressors, including financial difficulties and concerns about his dog's health. He describes feeling 'jittery' and 'aggravated', with his anxiety exacerbated by his financial situation and responsibilities. He takes propranolol  as needed for anxiety, which he finds helpful, but notes that taking more than one dose makes him feel unwell. Financial stress is further compounded by unexpected deductions from his bank account, and caring for his elderly dog, including administering medications, adds to his stress.  He has a rash on his back and arms, describing it as itchy and sometimes painful after scratching. The rash has been present for a few weeks, and scratching has led to bleeding. He has not used any creams or treatments for the rash yet. No previous similar rashes.  He discusses ongoing issues with his prostate, including daily pain and frequent urination. Antibiotics previously prescribed provided some relief, particularly with erectile dysfunction, but  he continues to experience frequent urination, which he finds disruptive. He is awaiting his first urology appointment for further evaluation and management.  He has resumed taking his blood pressure medication after noting a high reading of 200/80. He expresses concern about his blood pressure and its relation to his anxiety. He reports that his arthritis doctor  told him his liver enzymes were normal and that tests for lupus and immune system issues were negative.  Socially, he lives alone and is in recovery, attending meetings regularly. 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         Objective:  Physical Exam: BP 130/82   Pulse (!) 58   Temp (!) 97.2 F (36.2 C) (Temporal)   Ht 5' 11 (1.803 m)   Wt 152 lb 6.4 oz (69.1 kg)   SpO2 98%   BMI 21.26 kg/m   Gen: No acute distress, resting comfortably CV: Regular rate and rhythm with no murmurs appreciated Pulm: Normal work of breathing, clear to auscultation bilaterally with no crackles, wheezes, or rhonchi Skin: Erythematous plaque approximately 1 cm in diameter with overlying silvery scale and well-demarcated borders noted on right upper extremity and right back Neuro: Grossly normal, moves all extremities Psych: Normal affect and thought content      Bryan Cheslock M. Kennyth, MD 05/18/2024 10:49 AM  "

## 2024-05-18 NOTE — Patient Instructions (Signed)
 It was very nice to see you today!  VISIT SUMMARY: During your visit, we discussed your anxiety, rash, prostate issues, and high blood pressure. We have made some adjustments to your treatment plan to help manage these conditions.  YOUR PLAN: PSORIASIS WITH PRURITUS: You have a rash on your back and arms that is itchy and sometimes painful after scratching. -Apply clobetasol  ointment to the affected areas twice daily. -Take Allegra for itching. -Monitor the rash for improvement within a week and report if there is no improvement.  GENERALIZED ANXIETY DISORDER: Your anxiety has been exacerbated by stressors such as financial difficulties and concerns about your dog's health. -Continue taking propranolol  as needed for acute symptoms. -We have prescribed additional propranolol . -Engage in recovery meetings and support systems to help manage your anxiety.  CHRONIC PROSTATITIS WITH LOWER URINARY TRACT SYMPTOMS: You have ongoing issues with your prostate, including daily pain and frequent urination. -Continue current management until your upcoming urology appointment. -Discuss further treatment options with the urologist during your appointment.  HYPERTENSION: You have had recent elevated blood pressure readings. -Continue your current antihypertensive medication regimen. -Monitor your blood pressure regularly.  No follow-ups on file.   Take care, Dr Kennyth  PLEASE NOTE:  If you had any lab tests, please let us  know if you have not heard back within a few days. You may see your results on mychart before we have a chance to review them but we will give you a call once they are reviewed by us .   If we ordered any referrals today, please let us  know if you have not heard from their office within the next week.   If you had any urgent prescriptions sent in today, please check with the pharmacy within an hour of our visit to make sure the prescription was transmitted appropriately.   Please  try these tips to maintain a healthy lifestyle:  Eat at least 3 REAL meals and 1-2 snacks per day.  Aim for no more than 5 hours between eating.  If you eat breakfast, please do so within one hour of getting up.   Each meal should contain half fruits/vegetables, one quarter protein, and one quarter carbs (no bigger than a computer mouse)  Cut down on sweet beverages. This includes juice, soda, and sweet tea.   Drink at least 1 glass of water with each meal and aim for at least 8 glasses per day  Exercise at least 150 minutes every week.

## 2024-05-19 ENCOUNTER — Telehealth: Payer: Self-pay

## 2024-05-19 NOTE — Telephone Encounter (Signed)
 Left message to return call to our office at their convenience.

## 2024-05-19 NOTE — Telephone Encounter (Signed)
 Please see pt msg regarding cost of prescription and request for an alternative that is more affordable

## 2024-05-19 NOTE — Telephone Encounter (Signed)
 Noted; alternative list sent to [facility] for review and recommendations. Nothing further needed on this note

## 2024-05-19 NOTE — Telephone Encounter (Signed)
 Patient returned call to office. I relayed message to patient that provider recommends that he check with his insurance in regards to cost for ointment that was prescribed.

## 2024-05-19 NOTE — Telephone Encounter (Signed)
 Please see list of alternative from patient and advise

## 2024-05-19 NOTE — Telephone Encounter (Signed)
 Recommend he check with insurance

## 2024-05-19 NOTE — Telephone Encounter (Signed)
 Copied from CRM [id number]. Topic: Clinical - Prescription Issue >> May 18, 2024  4:38 PM Hadassah PARAS wrote: Reason for CRM: Pt went to pharm to pick up  clobetasol  ointment (TEMOVATE ) 0.05 % and it was $112. Pt is req an alternative medication. Please advise pt on #[id number] >> May 19, 2024  3:18 PM Drema MATSU wrote: Pt called insurance company and they gave him two alternatives.  Betameth Halobetasol

## 2024-05-20 ENCOUNTER — Other Ambulatory Visit: Payer: Self-pay | Admitting: Family Medicine

## 2024-05-20 MED ORDER — BETAMETHASONE DIPROPIONATE 0.05 % EX CREA: TOPICAL_CREAM | Freq: Two times a day (BID) | CUTANEOUS | 0 refills | Status: AC

## 2024-05-20 NOTE — Telephone Encounter (Signed)
Betamethasone sent in

## 2024-05-23 ENCOUNTER — Other Ambulatory Visit

## 2024-05-23 ENCOUNTER — Ambulatory Visit: Payer: Medicare PPO

## 2024-05-26 ENCOUNTER — Other Ambulatory Visit (INDEPENDENT_AMBULATORY_CARE_PROVIDER_SITE_OTHER)

## 2024-05-26 DIAGNOSIS — D508 Other iron deficiency anemias: Secondary | ICD-10-CM

## 2024-05-26 DIAGNOSIS — R350 Frequency of micturition: Secondary | ICD-10-CM

## 2024-05-26 DIAGNOSIS — R102 Pelvic and perineal pain unspecified side: Secondary | ICD-10-CM | POA: Diagnosis not present

## 2024-05-26 DIAGNOSIS — N529 Male erectile dysfunction, unspecified: Secondary | ICD-10-CM

## 2024-05-26 LAB — CBC WITH DIFFERENTIAL/PLATELET
Basophils Absolute: 0 K/uL (ref 0.0–0.1)
Basophils Relative: 0.4 % (ref 0.0–3.0)
Eosinophils Absolute: 0.4 K/uL (ref 0.0–0.7)
Eosinophils Relative: 6.4 % — ABNORMAL HIGH (ref 0.0–5.0)
HCT: 37.7 % — ABNORMAL LOW (ref 39.0–52.0)
Hemoglobin: 13.1 g/dL (ref 13.0–17.0)
Lymphocytes Relative: 18 % (ref 12.0–46.0)
Lymphs Abs: 1.2 K/uL (ref 0.7–4.0)
MCHC: 34.9 g/dL (ref 30.0–36.0)
MCV: 93.9 fl (ref 78.0–100.0)
Monocytes Absolute: 0.8 K/uL (ref 0.1–1.0)
Monocytes Relative: 11.8 % (ref 3.0–12.0)
Neutro Abs: 4.1 K/uL (ref 1.4–7.7)
Neutrophils Relative %: 63.4 % (ref 43.0–77.0)
Platelets: 263 K/uL (ref 150.0–400.0)
RBC: 4.01 Mil/uL — ABNORMAL LOW (ref 4.22–5.81)
RDW: 14.9 % (ref 11.5–15.5)
WBC: 6.4 K/uL (ref 4.0–10.5)

## 2024-05-26 LAB — IBC + FERRITIN
Ferritin: 32.7 ng/mL (ref 22.0–322.0)
Iron: 96 ug/dL (ref 42–165)
Saturation Ratios: 24.4 % (ref 20.0–50.0)
TIBC: 393.4 ug/dL (ref 250.0–450.0)
Transferrin: 281 mg/dL (ref 212.0–360.0)

## 2024-05-26 LAB — PSA: PSA: 2.74 ng/mL (ref 0.10–4.00)

## 2024-05-27 ENCOUNTER — Ambulatory Visit: Payer: Self-pay | Admitting: Physician Assistant

## 2024-05-31 NOTE — Progress Notes (Unsigned)
"       ° °  LILLETTE Ileana Collet, PhD, LAT, ATC acting as a scribe for Artist Lloyd, MD.  [name] is a 69 y.o. male who presents to [facility] today for exacerbation of his LBP. Pt was last seen for his back on 01/27/24 and was referred to PT, completing only 1 visit (canceling remaining scheduled visits).  Last lumbar ESI, 08/06/22.  Today, pt c/o LBP exacerbation from shoveling the snow/ice this weekend. Pt locates pain to across both sides of his low back.   Radiating pain: yes- lateral aspect of L thigh, intermittently.  LE numbness/tingling: no LE weakness: no Aggravates: any movement Treatments tried: IBU, gabapentin , rest  Dx testing: 03/23/22 L-spine MRI  02/27/22 L-spine XR  Pertinent review of systems: No fevers or chills  Relevant historical information: History of lumbar radiculopathy based on MRI 2023 with epidural steroid injection in 2024.   Exam:  BP 138/80   Pulse 61   Ht 5' 11 (1.803 m)   Wt 153 lb (69.4 kg)   SpO2 96%   BMI 21.34 kg/m  General: Well Developed, well nourished, and in no acute distress.   MSK: L-spine: Normal. Nontender to palpation spinal midline. Decreased lumbar motion. Lower extremity strength is intact. Reflexes are intact.    Lab and Radiology Results No results found for this or any previous visit (from the past 72 hours). No results found.     Assessment and Plan: 69 y.o. male with exacerbation of chronic low back pain.  Pain predominantly due to muscle spasm and dysfunction.  He does not have significant radicular symptoms today thankfully.  Plan for Depo-Medrol  and Toradol  injection IM prior to discharge.  He does have an existing prescription for baclofen  that he can use at bedtime and use ibuprofen  as needed.  Work note provided to excuse him from missed work on Monday the 26th.  He anticipates returning to work tomorrow.  If that cannot happen we can revise work note if needed.   PDMP not  reviewed this encounter. No orders of the defined types were placed in this encounter.  Meds ordered this encounter  Medications   methylPREDNISolone  acetate (DEPO-MEDROL ) injection 80 mg   ketorolac  (TORADOL ) injection 60 mg     Discussed warning signs or symptoms. Please see discharge instructions. Patient expresses understanding.   The above documentation has been reviewed and is accurate and complete Artist Lloyd, M.D.   "

## 2024-06-01 ENCOUNTER — Ambulatory Visit: Admitting: Family Medicine

## 2024-06-01 VITALS — BP 138/80 | HR 61 | Ht 71.0 in | Wt 153.0 lb

## 2024-06-01 DIAGNOSIS — G8929 Other chronic pain: Secondary | ICD-10-CM

## 2024-06-01 DIAGNOSIS — M545 Low back pain, unspecified: Secondary | ICD-10-CM

## 2024-06-01 MED ORDER — METHYLPREDNISOLONE ACETATE 80 MG/ML IJ SUSP
80.0000 mg | Freq: Once | INTRAMUSCULAR | Status: AC
  Administered 2024-06-01: 80 mg via INTRAMUSCULAR

## 2024-06-01 MED ORDER — KETOROLAC TROMETHAMINE 60 MG/2ML IM SOLN
60.0000 mg | Freq: Once | INTRAMUSCULAR | Status: AC
  Administered 2024-06-01: 60 mg via INTRAMUSCULAR

## 2024-06-01 NOTE — Patient Instructions (Addendum)
 Thank you for coming in today.   You received an injection today. Seek immediate medical attention if the joint becomes red, extremely painful, or is oozing fluid.   Work note provided  OK to use baclofen  at bedtime  OK to use Ibuprofen   Check back as needed

## 2024-06-09 ENCOUNTER — Other Ambulatory Visit: Payer: Self-pay | Admitting: Family Medicine

## 2024-07-19 ENCOUNTER — Ambulatory Visit: Admitting: Physician Assistant

## 2024-08-09 ENCOUNTER — Ambulatory Visit: Admitting: Family Medicine
